# Patient Record
Sex: Female | Born: 1955 | Race: Black or African American | Hispanic: No | Marital: Single | Smoking: Never smoker
Health system: Southern US, Community
[De-identification: ages and names within clinical notes are randomized; demographics above are authoritative.]

## PROBLEM LIST (undated history)

## (undated) DIAGNOSIS — F0390 Unspecified dementia without behavioral disturbance: Secondary | ICD-10-CM

## (undated) DIAGNOSIS — R569 Unspecified convulsions: Secondary | ICD-10-CM

## (undated) DIAGNOSIS — I10 Essential (primary) hypertension: Secondary | ICD-10-CM

## (undated) DIAGNOSIS — F209 Schizophrenia, unspecified: Secondary | ICD-10-CM

## (undated) DIAGNOSIS — F319 Bipolar disorder, unspecified: Secondary | ICD-10-CM

## (undated) DIAGNOSIS — E119 Type 2 diabetes mellitus without complications: Secondary | ICD-10-CM

## (undated) HISTORY — DX: Unspecified dementia, unspecified severity, without behavioral disturbance, psychotic disturbance, mood disturbance, and anxiety: F03.90

## (undated) HISTORY — DX: Essential (primary) hypertension: I10

---

## 2003-03-28 ENCOUNTER — Encounter: Payer: Self-pay | Admitting: Internal Medicine

## 2003-03-28 ENCOUNTER — Ambulatory Visit (HOSPITAL_COMMUNITY): Admission: RE | Admit: 2003-03-28 | Discharge: 2003-03-28 | Payer: Self-pay | Admitting: Internal Medicine

## 2005-02-28 ENCOUNTER — Ambulatory Visit (HOSPITAL_COMMUNITY): Admission: RE | Admit: 2005-02-28 | Discharge: 2005-02-28 | Payer: Self-pay | Admitting: Internal Medicine

## 2009-02-03 ENCOUNTER — Encounter: Payer: Self-pay | Admitting: Gastroenterology

## 2009-02-16 HISTORY — PX: COLONOSCOPY: SHX174

## 2009-03-07 ENCOUNTER — Ambulatory Visit (HOSPITAL_COMMUNITY): Admission: RE | Admit: 2009-03-07 | Discharge: 2009-03-07 | Payer: Self-pay | Admitting: Gastroenterology

## 2009-03-07 ENCOUNTER — Ambulatory Visit: Payer: Self-pay | Admitting: Gastroenterology

## 2010-08-21 ENCOUNTER — Ambulatory Visit (HOSPITAL_COMMUNITY)
Admission: RE | Admit: 2010-08-21 | Discharge: 2010-08-21 | Payer: Self-pay | Source: Home / Self Care | Attending: Internal Medicine | Admitting: Internal Medicine

## 2010-09-09 ENCOUNTER — Encounter: Payer: Self-pay | Admitting: Internal Medicine

## 2010-09-10 ENCOUNTER — Encounter: Payer: Self-pay | Admitting: Internal Medicine

## 2011-01-01 NOTE — Op Note (Signed)
NAME:  Stephanie Vincent, Stephanie Vincent            ACCOUNT NO.:  [id number]   MEDICAL RECORD NO.:  [id number]          PATIENT TYPE:  AMB   LOCATION:  DAY                           FACILITY:  [facility]   PHYSICIAN:  Kassie Mends, M.D.      DATE OF BIRTH:  07-25-1956   DATE OF PROCEDURE:  03/07/2009  DATE OF DISCHARGE:                               OPERATIVE REPORT   REFERRING PHYSICIAN:  Tesfaye D. Felecia Shelling, MD   PROCEDURE:  Colonoscopy.   INDICATION FOR EXAM:  Stephanie Vincent is a 55 year old female who presents  for average-risk colon cancer screening.   FINDINGS:  1. Slightly tortuous colon.  Otherwise, normal colon without evidence      of polyps, masses, inflammatory changes, diverticula, or AVMs.      Excellent prep.  2. Normal retroflexed view of the rectum.   RECOMMENDATIONS:  1. Screening colonoscopy in 10 years.  2. She should follow a high-fiber diet.  She is given a handout on      high-fiber diet.   MEDICATIONS:  1. Demerol 100 mg IV.  2. Versed 5 mg IV.  3. Phenergan 6.25 mg IV.   PROCEDURE TECHNIQUE:  Physical exam was performed.  Informed consent was  obtained from the patient after explaining the benefits, risks, and  alternatives to the procedure.  The patient was connected to the monitor  and placed in the left lateral position.  Continuous oxygen was provided  by nasal cannula and IV medicine administered through an indwelling  cannula.  After administration of sedation and rectal exam, the  patient's rectum was entered,  and the scope was advanced under direct visualization to the cecum.  The  scope was removed slowly by carefully examining the color, texture,  anatomy, and integrity of the mucosa on the way out.  The patient was  recovered in endoscopy and discharged home in satisfactory condition.      Kassie Mends, M.D.  Electronically Signed     SM/MEDQ  D:  03/07/2009  T:  03/07/2009  Job:  [id number]   cc:   Tesfaye D. Felecia Shelling, MD  Fax: [phone]

## 2011-12-09 DIAGNOSIS — F332 Major depressive disorder, recurrent severe without psychotic features: Secondary | ICD-10-CM | POA: Diagnosis not present

## 2012-02-04 DIAGNOSIS — Z0000 Encounter for general adult medical examination without abnormal findings: Secondary | ICD-10-CM | POA: Diagnosis not present

## 2012-05-29 DIAGNOSIS — E785 Hyperlipidemia, unspecified: Secondary | ICD-10-CM | POA: Diagnosis not present

## 2012-05-29 DIAGNOSIS — Z0000 Encounter for general adult medical examination without abnormal findings: Secondary | ICD-10-CM | POA: Diagnosis not present

## 2012-05-29 DIAGNOSIS — I10 Essential (primary) hypertension: Secondary | ICD-10-CM | POA: Diagnosis not present

## 2012-06-03 DIAGNOSIS — G3184 Mild cognitive impairment, so stated: Secondary | ICD-10-CM | POA: Diagnosis not present

## 2012-06-03 DIAGNOSIS — IMO0002 Reserved for concepts with insufficient information to code with codable children: Secondary | ICD-10-CM | POA: Diagnosis not present

## 2012-06-05 DIAGNOSIS — F3289 Other specified depressive episodes: Secondary | ICD-10-CM | POA: Diagnosis not present

## 2012-06-05 DIAGNOSIS — F209 Schizophrenia, unspecified: Secondary | ICD-10-CM | POA: Diagnosis not present

## 2012-06-05 DIAGNOSIS — F329 Major depressive disorder, single episode, unspecified: Secondary | ICD-10-CM | POA: Diagnosis not present

## 2012-07-02 DIAGNOSIS — F339 Major depressive disorder, recurrent, unspecified: Secondary | ICD-10-CM | POA: Diagnosis not present

## 2012-08-20 DIAGNOSIS — J069 Acute upper respiratory infection, unspecified: Secondary | ICD-10-CM | POA: Diagnosis not present

## 2012-08-20 DIAGNOSIS — J410 Simple chronic bronchitis: Secondary | ICD-10-CM | POA: Diagnosis not present

## 2012-09-29 DIAGNOSIS — F331 Major depressive disorder, recurrent, moderate: Secondary | ICD-10-CM | POA: Diagnosis not present

## 2012-09-29 DIAGNOSIS — G3184 Mild cognitive impairment, so stated: Secondary | ICD-10-CM | POA: Diagnosis not present

## 2012-12-31 DIAGNOSIS — F339 Major depressive disorder, recurrent, unspecified: Secondary | ICD-10-CM | POA: Diagnosis not present

## 2013-07-27 ENCOUNTER — Other Ambulatory Visit (HOSPITAL_COMMUNITY): Payer: Self-pay | Admitting: Internal Medicine

## 2013-07-27 DIAGNOSIS — F339 Major depressive disorder, recurrent, unspecified: Secondary | ICD-10-CM | POA: Diagnosis not present

## 2013-07-27 DIAGNOSIS — F2089 Other schizophrenia: Secondary | ICD-10-CM | POA: Diagnosis not present

## 2013-07-27 DIAGNOSIS — Z139 Encounter for screening, unspecified: Secondary | ICD-10-CM

## 2013-08-03 ENCOUNTER — Ambulatory Visit (HOSPITAL_COMMUNITY)
Admission: RE | Admit: 2013-08-03 | Discharge: 2013-08-03 | Disposition: A | Discharge status: Home / Self Care | Payer: Medicare Other | Source: Ambulatory Visit | Attending: Internal Medicine | Admitting: Internal Medicine

## 2013-08-03 DIAGNOSIS — Z1231 Encounter for screening mammogram for malignant neoplasm of breast: Secondary | ICD-10-CM | POA: Diagnosis not present

## 2013-08-03 DIAGNOSIS — Z139 Encounter for screening, unspecified: Secondary | ICD-10-CM

## 2013-08-05 DIAGNOSIS — Z23 Encounter for immunization: Secondary | ICD-10-CM | POA: Diagnosis not present

## 2013-12-27 DIAGNOSIS — F339 Major depressive disorder, recurrent, unspecified: Secondary | ICD-10-CM | POA: Diagnosis not present

## 2014-02-17 DIAGNOSIS — G3184 Mild cognitive impairment, so stated: Secondary | ICD-10-CM | POA: Diagnosis not present

## 2014-02-17 DIAGNOSIS — IMO0002 Reserved for concepts with insufficient information to code with codable children: Secondary | ICD-10-CM | POA: Diagnosis not present

## 2014-06-27 DIAGNOSIS — Z23 Encounter for immunization: Secondary | ICD-10-CM | POA: Diagnosis not present

## 2014-06-27 DIAGNOSIS — F329 Major depressive disorder, single episode, unspecified: Secondary | ICD-10-CM | POA: Diagnosis not present

## 2014-06-27 DIAGNOSIS — F209 Schizophrenia, unspecified: Secondary | ICD-10-CM | POA: Diagnosis not present

## 2014-09-02 DIAGNOSIS — F3341 Major depressive disorder, recurrent, in partial remission: Secondary | ICD-10-CM | POA: Diagnosis not present

## 2014-09-02 DIAGNOSIS — F200 Paranoid schizophrenia: Secondary | ICD-10-CM | POA: Diagnosis not present

## 2014-09-02 DIAGNOSIS — G3184 Mild cognitive impairment, so stated: Secondary | ICD-10-CM | POA: Diagnosis not present

## 2014-12-26 DIAGNOSIS — F329 Major depressive disorder, single episode, unspecified: Secondary | ICD-10-CM | POA: Diagnosis not present

## 2014-12-26 DIAGNOSIS — I10 Essential (primary) hypertension: Secondary | ICD-10-CM | POA: Diagnosis not present

## 2015-02-28 ENCOUNTER — Other Ambulatory Visit (HOSPITAL_COMMUNITY): Payer: Self-pay | Admitting: Internal Medicine

## 2015-02-28 DIAGNOSIS — Z1231 Encounter for screening mammogram for malignant neoplasm of breast: Secondary | ICD-10-CM

## 2015-03-10 ENCOUNTER — Ambulatory Visit (HOSPITAL_COMMUNITY)
Admission: RE | Admit: 2015-03-10 | Discharge: 2015-03-10 | Disposition: A | Discharge status: Home / Self Care | Payer: Medicare Other | Source: Ambulatory Visit | Attending: Internal Medicine | Admitting: Internal Medicine

## 2015-03-10 DIAGNOSIS — Z1231 Encounter for screening mammogram for malignant neoplasm of breast: Secondary | ICD-10-CM | POA: Diagnosis not present

## 2015-04-03 DIAGNOSIS — F329 Major depressive disorder, single episode, unspecified: Secondary | ICD-10-CM | POA: Diagnosis not present

## 2015-04-03 DIAGNOSIS — F209 Schizophrenia, unspecified: Secondary | ICD-10-CM | POA: Diagnosis not present

## 2015-04-13 DIAGNOSIS — G40804 Other epilepsy, intractable, without status epilepticus: Secondary | ICD-10-CM | POA: Diagnosis not present

## 2015-04-13 DIAGNOSIS — F71 Moderate intellectual disabilities: Secondary | ICD-10-CM | POA: Diagnosis not present

## 2015-04-13 DIAGNOSIS — I10 Essential (primary) hypertension: Secondary | ICD-10-CM | POA: Diagnosis not present

## 2015-04-13 DIAGNOSIS — M419 Scoliosis, unspecified: Secondary | ICD-10-CM | POA: Diagnosis not present

## 2015-04-17 DIAGNOSIS — F3341 Major depressive disorder, recurrent, in partial remission: Secondary | ICD-10-CM | POA: Diagnosis not present

## 2015-04-17 DIAGNOSIS — F200 Paranoid schizophrenia: Secondary | ICD-10-CM | POA: Diagnosis not present

## 2015-04-17 DIAGNOSIS — G3184 Mild cognitive impairment, so stated: Secondary | ICD-10-CM | POA: Diagnosis not present

## 2015-05-12 DIAGNOSIS — R569 Unspecified convulsions: Secondary | ICD-10-CM | POA: Diagnosis not present

## 2015-07-03 DIAGNOSIS — F339 Major depressive disorder, recurrent, unspecified: Secondary | ICD-10-CM | POA: Diagnosis not present

## 2015-07-03 DIAGNOSIS — Z23 Encounter for immunization: Secondary | ICD-10-CM | POA: Diagnosis not present

## 2015-07-03 DIAGNOSIS — F209 Schizophrenia, unspecified: Secondary | ICD-10-CM | POA: Diagnosis not present

## 2015-07-10 DIAGNOSIS — G40804 Other epilepsy, intractable, without status epilepticus: Secondary | ICD-10-CM | POA: Diagnosis not present

## 2015-07-10 DIAGNOSIS — F71 Moderate intellectual disabilities: Secondary | ICD-10-CM | POA: Diagnosis not present

## 2015-07-10 DIAGNOSIS — G40909 Epilepsy, unspecified, not intractable, without status epilepticus: Secondary | ICD-10-CM | POA: Diagnosis not present

## 2015-07-10 DIAGNOSIS — F200 Paranoid schizophrenia: Secondary | ICD-10-CM | POA: Diagnosis not present

## 2015-09-13 DIAGNOSIS — Z79899 Other long term (current) drug therapy: Secondary | ICD-10-CM | POA: Diagnosis not present

## 2015-09-13 DIAGNOSIS — G40804 Other epilepsy, intractable, without status epilepticus: Secondary | ICD-10-CM | POA: Diagnosis not present

## 2015-09-13 DIAGNOSIS — F71 Moderate intellectual disabilities: Secondary | ICD-10-CM | POA: Diagnosis not present

## 2015-09-13 DIAGNOSIS — F200 Paranoid schizophrenia: Secondary | ICD-10-CM | POA: Diagnosis not present

## 2015-11-21 DIAGNOSIS — E538 Deficiency of other specified B group vitamins: Secondary | ICD-10-CM | POA: Diagnosis not present

## 2015-11-21 DIAGNOSIS — M818 Other osteoporosis without current pathological fracture: Secondary | ICD-10-CM | POA: Diagnosis not present

## 2015-11-21 DIAGNOSIS — R5383 Other fatigue: Secondary | ICD-10-CM | POA: Diagnosis not present

## 2015-11-21 DIAGNOSIS — Z79899 Other long term (current) drug therapy: Secondary | ICD-10-CM | POA: Diagnosis not present

## 2015-11-21 DIAGNOSIS — E559 Vitamin D deficiency, unspecified: Secondary | ICD-10-CM | POA: Diagnosis not present

## 2015-11-22 DIAGNOSIS — G3184 Mild cognitive impairment, so stated: Secondary | ICD-10-CM | POA: Diagnosis not present

## 2015-11-22 DIAGNOSIS — F200 Paranoid schizophrenia: Secondary | ICD-10-CM | POA: Diagnosis not present

## 2015-12-07 DIAGNOSIS — F71 Moderate intellectual disabilities: Secondary | ICD-10-CM | POA: Diagnosis not present

## 2015-12-07 DIAGNOSIS — F200 Paranoid schizophrenia: Secondary | ICD-10-CM | POA: Diagnosis not present

## 2015-12-07 DIAGNOSIS — G40804 Other epilepsy, intractable, without status epilepticus: Secondary | ICD-10-CM | POA: Diagnosis not present

## 2015-12-07 DIAGNOSIS — Z79899 Other long term (current) drug therapy: Secondary | ICD-10-CM | POA: Diagnosis not present

## 2015-12-25 DIAGNOSIS — I10 Essential (primary) hypertension: Secondary | ICD-10-CM | POA: Diagnosis not present

## 2015-12-25 DIAGNOSIS — F329 Major depressive disorder, single episode, unspecified: Secondary | ICD-10-CM | POA: Diagnosis not present

## 2015-12-25 DIAGNOSIS — E785 Hyperlipidemia, unspecified: Secondary | ICD-10-CM | POA: Diagnosis not present

## 2015-12-25 DIAGNOSIS — F209 Schizophrenia, unspecified: Secondary | ICD-10-CM | POA: Diagnosis not present

## 2015-12-25 DIAGNOSIS — F259 Schizoaffective disorder, unspecified: Secondary | ICD-10-CM | POA: Diagnosis not present

## 2015-12-27 ENCOUNTER — Encounter: Payer: Self-pay | Admitting: Obstetrics and Gynecology

## 2016-01-04 ENCOUNTER — Other Ambulatory Visit: Payer: Self-pay | Admitting: Obstetrics and Gynecology

## 2016-01-04 ENCOUNTER — Other Ambulatory Visit (HOSPITAL_COMMUNITY)
Admission: RE | Admit: 2016-01-04 | Discharge: 2016-01-04 | Disposition: A | Discharge status: Home / Self Care | Payer: Medicare Other | Source: Ambulatory Visit | Attending: Obstetrics and Gynecology | Admitting: Obstetrics and Gynecology

## 2016-01-04 ENCOUNTER — Ambulatory Visit (INDEPENDENT_AMBULATORY_CARE_PROVIDER_SITE_OTHER): Payer: Medicare Other | Admitting: Obstetrics and Gynecology

## 2016-01-04 ENCOUNTER — Encounter: Payer: Self-pay | Admitting: Obstetrics and Gynecology

## 2016-01-04 VITALS — BP 120/80 | Ht 63.0 in | Wt 124.0 lb

## 2016-01-04 DIAGNOSIS — Z124 Encounter for screening for malignant neoplasm of cervix: Secondary | ICD-10-CM

## 2016-01-04 DIAGNOSIS — Z01419 Encounter for gynecological examination (general) (routine) without abnormal findings: Secondary | ICD-10-CM | POA: Diagnosis not present

## 2016-01-04 DIAGNOSIS — Z1151 Encounter for screening for human papillomavirus (HPV): Secondary | ICD-10-CM | POA: Diagnosis not present

## 2016-01-04 DIAGNOSIS — N841 Polyp of cervix uteri: Secondary | ICD-10-CM | POA: Diagnosis not present

## 2016-01-04 NOTE — Progress Notes (Signed)
   [facility]  @DATE @            Patient name: Stephanie PoppFrances L Vincent MRN [id number]  Date of birth: July 23, 1956  CC & HPI:  Stephanie PoppFrances L Vincent is a 60 y.o. female presenting today for a pap smear. Her caretaker (LPN) denies any complaints at this time.   ROS:  Review of Systems  Unable to perform ROS: dementia   Pertinent History Reviewed:   Reviewed: Significant for n/a Medical        History reviewed. No pertinent past medical history.                            Surgical Hx:   History reviewed. No pertinent past surgical history. Medications: Reviewed & Updated - see associated section                      No current outpatient prescriptions on file.   Social History: Reviewed -  reports that she has never smoked. She has never used smokeless tobacco.  Objective Findings:  Vitals: Blood pressure 120/80, height 5\' 3"  (1.6 m), weight 124 lb (56.246 kg).  Physical Examination: General appearance - alert, well appearing, and in no distress and normal appearing weight Pelvic -  VULVA: normal appearing vulva with no masses, tenderness or lesions,  VAGINA: normal appearing vagina with normal color and discharge, no lesions; tight, transverse vaginal band just below cervix,   CERVIX: normal appearing cervix without discharge or lesions, 4 mm cervical polyp that was excised by myself UTERUS: uterus is tiny in size, shape, consistency and nontender,  ADNEXA: normal adnexa in size, nontender and no masses   Assessment & Plan:   A:  1. Pap smear collected. 2. Cervical polyp measuring about 4 mm, excised by myself, and sent for biopsy.  P:  1. Follow up prn.    By signing my name below, I, Ronney LionSuzanne Le, attest that this documentation has been prepared under the direction and in the presence of Tilda BurrowJohn Evan Osburn V, MD. Electronically Signed: Ronney LionSuzanne Le, ED Scribe. 01/04/2016. 3:08 PM.  I personally performed the services described in this documentation, which was SCRIBED in  my presence. The recorded information has been reviewed and considered accurate. It has been edited as necessary during review. Tilda BurrowFERGUSON,Juno Alers V, MD

## 2016-01-08 LAB — CYTOLOGY - PAP

## 2016-04-09 DIAGNOSIS — G40909 Epilepsy, unspecified, not intractable, without status epilepticus: Secondary | ICD-10-CM | POA: Diagnosis not present

## 2016-04-09 DIAGNOSIS — F71 Moderate intellectual disabilities: Secondary | ICD-10-CM | POA: Diagnosis not present

## 2016-04-09 DIAGNOSIS — F200 Paranoid schizophrenia: Secondary | ICD-10-CM | POA: Diagnosis not present

## 2016-04-09 DIAGNOSIS — G40804 Other epilepsy, intractable, without status epilepticus: Secondary | ICD-10-CM | POA: Diagnosis not present

## 2016-04-09 DIAGNOSIS — Z79899 Other long term (current) drug therapy: Secondary | ICD-10-CM | POA: Diagnosis not present

## 2016-04-09 DIAGNOSIS — F0390 Unspecified dementia without behavioral disturbance: Secondary | ICD-10-CM | POA: Diagnosis not present

## 2016-04-29 DIAGNOSIS — F209 Schizophrenia, unspecified: Secondary | ICD-10-CM | POA: Diagnosis not present

## 2016-04-29 DIAGNOSIS — F259 Schizoaffective disorder, unspecified: Secondary | ICD-10-CM | POA: Diagnosis not present

## 2016-04-29 DIAGNOSIS — E785 Hyperlipidemia, unspecified: Secondary | ICD-10-CM | POA: Diagnosis not present

## 2016-04-29 DIAGNOSIS — Z23 Encounter for immunization: Secondary | ICD-10-CM | POA: Diagnosis not present

## 2016-04-29 DIAGNOSIS — Z0000 Encounter for general adult medical examination without abnormal findings: Secondary | ICD-10-CM | POA: Diagnosis not present

## 2016-04-29 DIAGNOSIS — I10 Essential (primary) hypertension: Secondary | ICD-10-CM | POA: Diagnosis not present

## 2016-04-29 DIAGNOSIS — F3341 Major depressive disorder, recurrent, in partial remission: Secondary | ICD-10-CM | POA: Diagnosis not present

## 2016-05-30 DIAGNOSIS — F200 Paranoid schizophrenia: Secondary | ICD-10-CM | POA: Diagnosis not present

## 2016-05-30 DIAGNOSIS — G3184 Mild cognitive impairment, so stated: Secondary | ICD-10-CM | POA: Diagnosis not present

## 2016-10-07 DIAGNOSIS — G40804 Other epilepsy, intractable, without status epilepticus: Secondary | ICD-10-CM | POA: Diagnosis not present

## 2016-10-07 DIAGNOSIS — F200 Paranoid schizophrenia: Secondary | ICD-10-CM | POA: Diagnosis not present

## 2016-10-07 DIAGNOSIS — Z79899 Other long term (current) drug therapy: Secondary | ICD-10-CM | POA: Diagnosis not present

## 2016-10-07 DIAGNOSIS — F71 Moderate intellectual disabilities: Secondary | ICD-10-CM | POA: Diagnosis not present

## 2016-11-26 DIAGNOSIS — F981 Encopresis not due to a substance or known physiological condition: Secondary | ICD-10-CM | POA: Diagnosis not present

## 2016-11-26 DIAGNOSIS — G3184 Mild cognitive impairment, so stated: Secondary | ICD-10-CM | POA: Diagnosis not present

## 2016-11-26 DIAGNOSIS — F200 Paranoid schizophrenia: Secondary | ICD-10-CM | POA: Diagnosis not present

## 2016-12-16 DIAGNOSIS — I10 Essential (primary) hypertension: Secondary | ICD-10-CM | POA: Diagnosis not present

## 2016-12-16 DIAGNOSIS — G40909 Epilepsy, unspecified, not intractable, without status epilepticus: Secondary | ICD-10-CM | POA: Diagnosis not present

## 2016-12-16 DIAGNOSIS — F209 Schizophrenia, unspecified: Secondary | ICD-10-CM | POA: Diagnosis not present

## 2017-06-30 DIAGNOSIS — F200 Paranoid schizophrenia: Secondary | ICD-10-CM | POA: Diagnosis not present

## 2017-06-30 DIAGNOSIS — G3184 Mild cognitive impairment, so stated: Secondary | ICD-10-CM | POA: Diagnosis not present

## 2017-07-08 ENCOUNTER — Other Ambulatory Visit (HOSPITAL_COMMUNITY): Payer: Self-pay | Admitting: Internal Medicine

## 2017-07-08 DIAGNOSIS — Z23 Encounter for immunization: Secondary | ICD-10-CM | POA: Diagnosis not present

## 2017-07-08 DIAGNOSIS — F209 Schizophrenia, unspecified: Secondary | ICD-10-CM | POA: Diagnosis not present

## 2017-07-08 DIAGNOSIS — Z1389 Encounter for screening for other disorder: Secondary | ICD-10-CM | POA: Diagnosis not present

## 2017-07-08 DIAGNOSIS — E785 Hyperlipidemia, unspecified: Secondary | ICD-10-CM | POA: Diagnosis not present

## 2017-07-08 DIAGNOSIS — Z79899 Other long term (current) drug therapy: Secondary | ICD-10-CM | POA: Diagnosis not present

## 2017-07-08 DIAGNOSIS — Z0000 Encounter for general adult medical examination without abnormal findings: Secondary | ICD-10-CM | POA: Diagnosis not present

## 2017-07-08 DIAGNOSIS — Z1231 Encounter for screening mammogram for malignant neoplasm of breast: Secondary | ICD-10-CM

## 2017-07-08 DIAGNOSIS — G40909 Epilepsy, unspecified, not intractable, without status epilepticus: Secondary | ICD-10-CM | POA: Diagnosis not present

## 2017-07-08 DIAGNOSIS — Z131 Encounter for screening for diabetes mellitus: Secondary | ICD-10-CM | POA: Diagnosis not present

## 2017-07-08 DIAGNOSIS — F259 Schizoaffective disorder, unspecified: Secondary | ICD-10-CM | POA: Diagnosis not present

## 2017-07-08 DIAGNOSIS — I10 Essential (primary) hypertension: Secondary | ICD-10-CM | POA: Diagnosis not present

## 2017-07-28 ENCOUNTER — Ambulatory Visit (HOSPITAL_COMMUNITY): Payer: Self-pay

## 2017-09-11 ENCOUNTER — Ambulatory Visit (HOSPITAL_COMMUNITY): Payer: Self-pay

## 2017-09-17 ENCOUNTER — Inpatient Hospital Stay (HOSPITAL_COMMUNITY): Admission: RE | Admit: 2017-09-17 | Payer: Self-pay | Source: Ambulatory Visit

## 2017-10-20 DIAGNOSIS — I10 Essential (primary) hypertension: Secondary | ICD-10-CM | POA: Diagnosis not present

## 2017-10-20 DIAGNOSIS — F209 Schizophrenia, unspecified: Secondary | ICD-10-CM | POA: Diagnosis not present

## 2017-10-20 DIAGNOSIS — G40909 Epilepsy, unspecified, not intractable, without status epilepticus: Secondary | ICD-10-CM | POA: Diagnosis not present

## 2018-01-26 DIAGNOSIS — I10 Essential (primary) hypertension: Secondary | ICD-10-CM | POA: Diagnosis not present

## 2018-01-26 DIAGNOSIS — F0391 Unspecified dementia with behavioral disturbance: Secondary | ICD-10-CM | POA: Diagnosis not present

## 2018-01-26 DIAGNOSIS — G40919 Epilepsy, unspecified, intractable, without status epilepticus: Secondary | ICD-10-CM | POA: Diagnosis not present

## 2018-01-26 DIAGNOSIS — Z79899 Other long term (current) drug therapy: Secondary | ICD-10-CM | POA: Diagnosis not present

## 2018-01-27 DIAGNOSIS — F209 Schizophrenia, unspecified: Secondary | ICD-10-CM | POA: Diagnosis not present

## 2018-01-27 DIAGNOSIS — I10 Essential (primary) hypertension: Secondary | ICD-10-CM | POA: Diagnosis not present

## 2018-01-27 DIAGNOSIS — G40909 Epilepsy, unspecified, not intractable, without status epilepticus: Secondary | ICD-10-CM | POA: Diagnosis not present

## 2018-05-19 DIAGNOSIS — E46 Unspecified protein-calorie malnutrition: Secondary | ICD-10-CM | POA: Diagnosis not present

## 2018-05-19 DIAGNOSIS — Z23 Encounter for immunization: Secondary | ICD-10-CM | POA: Diagnosis not present

## 2018-05-19 DIAGNOSIS — F209 Schizophrenia, unspecified: Secondary | ICD-10-CM | POA: Diagnosis not present

## 2018-05-19 DIAGNOSIS — I10 Essential (primary) hypertension: Secondary | ICD-10-CM | POA: Diagnosis not present

## 2018-05-19 DIAGNOSIS — G40909 Epilepsy, unspecified, not intractable, without status epilepticus: Secondary | ICD-10-CM | POA: Diagnosis not present

## 2018-06-02 DIAGNOSIS — F981 Encopresis not due to a substance or known physiological condition: Secondary | ICD-10-CM | POA: Diagnosis not present

## 2018-06-02 DIAGNOSIS — F200 Paranoid schizophrenia: Secondary | ICD-10-CM | POA: Diagnosis not present

## 2018-06-02 DIAGNOSIS — G3184 Mild cognitive impairment, so stated: Secondary | ICD-10-CM | POA: Diagnosis not present

## 2018-06-09 DIAGNOSIS — E785 Hyperlipidemia, unspecified: Secondary | ICD-10-CM | POA: Diagnosis not present

## 2018-06-09 DIAGNOSIS — G40909 Epilepsy, unspecified, not intractable, without status epilepticus: Secondary | ICD-10-CM | POA: Diagnosis not present

## 2018-06-09 DIAGNOSIS — E46 Unspecified protein-calorie malnutrition: Secondary | ICD-10-CM | POA: Diagnosis not present

## 2018-06-09 DIAGNOSIS — F259 Schizoaffective disorder, unspecified: Secondary | ICD-10-CM | POA: Diagnosis not present

## 2018-06-09 DIAGNOSIS — I10 Essential (primary) hypertension: Secondary | ICD-10-CM | POA: Diagnosis not present

## 2018-06-10 ENCOUNTER — Encounter: Payer: Self-pay | Admitting: Gastroenterology

## 2018-07-09 ENCOUNTER — Ambulatory Visit: Payer: Medicare Other | Admitting: Gastroenterology

## 2018-07-21 DIAGNOSIS — F339 Major depressive disorder, recurrent, unspecified: Secondary | ICD-10-CM | POA: Diagnosis not present

## 2018-07-21 DIAGNOSIS — G40909 Epilepsy, unspecified, not intractable, without status epilepticus: Secondary | ICD-10-CM | POA: Diagnosis not present

## 2018-07-21 DIAGNOSIS — Z1389 Encounter for screening for other disorder: Secondary | ICD-10-CM | POA: Diagnosis not present

## 2018-07-21 DIAGNOSIS — I10 Essential (primary) hypertension: Secondary | ICD-10-CM | POA: Diagnosis not present

## 2018-07-21 DIAGNOSIS — Z1331 Encounter for screening for depression: Secondary | ICD-10-CM | POA: Diagnosis not present

## 2018-07-21 DIAGNOSIS — F0390 Unspecified dementia without behavioral disturbance: Secondary | ICD-10-CM | POA: Diagnosis not present

## 2018-07-23 ENCOUNTER — Other Ambulatory Visit (HOSPITAL_COMMUNITY): Payer: Self-pay | Admitting: Internal Medicine

## 2018-07-23 DIAGNOSIS — Z1231 Encounter for screening mammogram for malignant neoplasm of breast: Secondary | ICD-10-CM

## 2018-07-28 DIAGNOSIS — F0391 Unspecified dementia with behavioral disturbance: Secondary | ICD-10-CM | POA: Diagnosis not present

## 2018-07-28 DIAGNOSIS — I10 Essential (primary) hypertension: Secondary | ICD-10-CM | POA: Diagnosis not present

## 2018-07-28 DIAGNOSIS — G40919 Epilepsy, unspecified, intractable, without status epilepticus: Secondary | ICD-10-CM | POA: Diagnosis not present

## 2018-07-28 DIAGNOSIS — Z79899 Other long term (current) drug therapy: Secondary | ICD-10-CM | POA: Diagnosis not present

## 2018-08-31 DIAGNOSIS — F981 Encopresis not due to a substance or known physiological condition: Secondary | ICD-10-CM | POA: Diagnosis not present

## 2018-08-31 DIAGNOSIS — F200 Paranoid schizophrenia: Secondary | ICD-10-CM | POA: Diagnosis not present

## 2018-09-17 ENCOUNTER — Emergency Department (HOSPITAL_COMMUNITY)
Admission: EM | Admit: 2018-09-17 | Discharge: 2018-09-17 | Disposition: A | Discharge status: Home / Self Care | Payer: Medicare Other | Attending: Emergency Medicine | Admitting: Emergency Medicine

## 2018-09-17 ENCOUNTER — Other Ambulatory Visit: Payer: Self-pay

## 2018-09-17 ENCOUNTER — Emergency Department (HOSPITAL_COMMUNITY): Payer: Medicare Other

## 2018-09-17 ENCOUNTER — Ambulatory Visit: Payer: Medicare Other | Admitting: Gastroenterology

## 2018-09-17 DIAGNOSIS — M546 Pain in thoracic spine: Secondary | ICD-10-CM | POA: Diagnosis not present

## 2018-09-17 DIAGNOSIS — S8991XA Unspecified injury of right lower leg, initial encounter: Secondary | ICD-10-CM | POA: Diagnosis not present

## 2018-09-17 DIAGNOSIS — M542 Cervicalgia: Secondary | ICD-10-CM | POA: Diagnosis not present

## 2018-09-17 DIAGNOSIS — S299XXA Unspecified injury of thorax, initial encounter: Secondary | ICD-10-CM | POA: Diagnosis not present

## 2018-09-17 DIAGNOSIS — M25562 Pain in left knee: Secondary | ICD-10-CM | POA: Insufficient documentation

## 2018-09-17 DIAGNOSIS — S8992XA Unspecified injury of left lower leg, initial encounter: Secondary | ICD-10-CM | POA: Diagnosis not present

## 2018-09-17 DIAGNOSIS — M25561 Pain in right knee: Secondary | ICD-10-CM

## 2018-09-17 MED ORDER — IBUPROFEN 400 MG PO TABS
600.0000 mg | ORAL_TABLET | Freq: Once | ORAL | Status: AC
  Administered 2018-09-17: 400 mg via ORAL
  Filled 2018-09-17: qty 2

## 2018-09-17 NOTE — ED Notes (Signed)
Patient transported to X-ray 

## 2018-09-17 NOTE — ED Provider Notes (Signed)
[facility] Emergency Department Provider Note MRN:  [id number]  Arrival date & time: 09/17/18     Chief Complaint   Back pain History of Present Illness   Stephanie Vincent is a 63 y.o. year-old female with a history of schizophrenia and cognitive delay presenting to the ED with chief complaint of back pain.  Patient has been walking hunched over and with a limp for the past 2 days.  Denies trauma.  Denies fever, no chest pain or shortness of breath, no abdominal pain.  Caregiver explains that patient has been grabbing onto things to keep her balance and then slumped to the floor.  Has not fallen, has not sustained significant trauma.  Endorsing bilateral knee pain, thoracic back pain.  Denies neck pain, no low back pain, no hip pain.  Pain is constant, worse with motion.  I was unable to obtain an accurate HPI, PMH, or ROS due to the patient's cognitive delay.  Review of Systems  A complete 10 system review of systems was obtained and all systems are negative except as noted in the HPI and PMH.   Patient's Health History   Past medical history: Schizophrenia, cognitive delay   No family history on file.  Social history: Lives at care facility Social History   Socioeconomic History  . Marital status: Divorced    Spouse name: Not on file  . Number of children: Not on file  . Years of education: Not on file  . Highest education level: Not on file  Occupational History  . Not on file  Social Needs  . Financial resource strain: Not on file  . Food insecurity:    Worry: Not on file    Inability: Not on file  . Transportation needs:    Medical: Not on file    Non-medical: Not on file  Tobacco Use  . Smoking status: Not on file  Substance and Sexual Activity  . Alcohol use: Not on file  . Drug use: Not on file  . Sexual activity: Not on file  Lifestyle  . Physical activity:    Days per week: Not on file    Minutes per session: Not on file  . Stress: Not  on file  Relationships  . Social connections:    Talks on phone: Not on file    Gets together: Not on file    Attends religious service: Not on file    Active member of club or organization: Not on file    Attends meetings of clubs or organizations: Not on file    Relationship status: Not on file  . Intimate partner violence:    Fear of current or ex partner: Not on file    Emotionally abused: Not on file    Physically abused: Not on file    Forced sexual activity: Not on file  Other Topics Concern  . Not on file  Social History Narrative  . Not on file     Physical Exam  Vital Signs and Nursing Notes reviewed Vitals:   09/17/18 1745  BP: 137/85  Pulse: 87  Resp: 12  Temp: 97.6 F (36.4 C)  SpO2: 100%    CONSTITUTIONAL: Well-appearing, NAD NEURO:  Alert and oriented x 3, no focal deficits EYES:  eyes equal and reactive ENT/NECK:  no LAD, no JVD CARDIO: Regular rate, well-perfused, normal S1 and S2 PULM:  CTAB no wheezing or rhonchi GI/GU:  normal bowel sounds, non-distended, non-tender MSK/SPINE:  No gross deformities, 1+ pitting  edema bilateral lower extremities; ambulates hunched over with a limp favoring the left leg SKIN:  no rash, atraumatic PSYCH:  Appropriate speech and behavior  Diagnostic and Interventional Summary    Labs Reviewed - No data to display  DG Thoracic Spine 2 View  Final Result    DG Knee Complete 4 Views Left  Final Result    DG Knee Complete 4 Views Right  Final Result      Medications  ibuprofen (ADVIL,MOTRIN) tablet 600 mg (400 mg Oral Given 09/17/18 2029)     Procedures Critical Care  ED Course and Medical Decision Making  I have reviewed the triage vital signs and the nursing notes.  Pertinent labs & imaging results that were available during my care of the patient were reviewed by me and considered in my medical decision making (see below for details).  Unclear reason for patient's new ambulation issues, no focal  neurological deficits, mild edema to lower extremities but with no tenderness, no erythema, swelling is symmetric, little to no concern for DVT, nothing to suggest infection.  Normal range of motion of the hips, knees, ankles, nothing to suggest septic joint.  Given patient's limitations in communication, will obtain screening x-rays.  X-rays consistent with arthritic changes.  Patient is without bowel or bladder dysfunction, no neurological deficits, nothing to suggest myelopathy with his back pain today.  Advised PCP follow-up, compression stockings for the leg swelling.  After the discussed management above, the patient was determined to be safe for discharge.  The patient was in agreement with this plan and all questions regarding their care were answered.  ED return precautions were discussed and the patient will return to the ED with any significant worsening of condition.  Elmer Sow. Pilar Plate, MD [facility] [email or web address]  Final Clinical Impressions(s) / ED Diagnoses     ICD-10-CM   1. Acute pain of both knees M25.561    M25.562   2. Acute midline thoracic back pain M54.6     ED Discharge Orders    None         Sabas Sous, MD 09/17/18 2144

## 2018-09-17 NOTE — Discharge Instructions (Addendum)
You were evaluated in the Emergency Department and after careful evaluation, we did not find any emergent condition requiring admission or further testing in the hospital.  Your symptoms today seem to be due to arthritis.  We recommend Tylenol or ibuprofen during the day for aches and pains.  For your leg swelling, we recommend compression stockings and elevating the legs during the day as much as possible.  Both of these issues should be followed up by your primary care provider.  Please return to the Emergency Department if you experience any worsening of your condition.  We encourage you to follow up with a primary care provider.  Thank you for allowing [place] to be a part of your care.

## 2018-09-17 NOTE — ED Notes (Signed)
Pt ambulatory to waiting room. Pt verbalized understanding of discharge instructions.   

## 2018-09-17 NOTE — ED Notes (Signed)
Pt wheeled out by family. Pt verbalized understanding of D/C instructions.

## 2018-09-17 NOTE — ED Triage Notes (Signed)
From [facility] in [place], pt fell today, complaining of back and knee pain.

## 2018-09-21 ENCOUNTER — Ambulatory Visit: Payer: Medicare Other | Admitting: Nurse Practitioner

## 2018-09-23 ENCOUNTER — Other Ambulatory Visit: Payer: Self-pay

## 2018-09-23 ENCOUNTER — Encounter (HOSPITAL_COMMUNITY): Payer: Self-pay

## 2018-09-23 ENCOUNTER — Emergency Department (HOSPITAL_COMMUNITY): Payer: Medicare Other

## 2018-09-23 ENCOUNTER — Emergency Department (HOSPITAL_COMMUNITY)
Admission: EM | Admit: 2018-09-23 | Discharge: 2018-09-24 | Disposition: A | Discharge status: Home / Self Care | Payer: Medicare Other | Attending: Emergency Medicine | Admitting: Emergency Medicine

## 2018-09-23 DIAGNOSIS — M545 Low back pain: Secondary | ICD-10-CM | POA: Diagnosis not present

## 2018-09-23 DIAGNOSIS — R600 Localized edema: Secondary | ICD-10-CM | POA: Diagnosis not present

## 2018-09-23 DIAGNOSIS — F331 Major depressive disorder, recurrent, moderate: Secondary | ICD-10-CM | POA: Diagnosis not present

## 2018-09-23 DIAGNOSIS — R000 Tachycardia, unspecified: Secondary | ICD-10-CM | POA: Diagnosis not present

## 2018-09-23 DIAGNOSIS — R609 Edema, unspecified: Secondary | ICD-10-CM

## 2018-09-23 DIAGNOSIS — F32A Depression, unspecified: Secondary | ICD-10-CM | POA: Diagnosis present

## 2018-09-23 DIAGNOSIS — F329 Major depressive disorder, single episode, unspecified: Secondary | ICD-10-CM | POA: Diagnosis present

## 2018-09-23 DIAGNOSIS — Z79899 Other long term (current) drug therapy: Secondary | ICD-10-CM | POA: Insufficient documentation

## 2018-09-23 DIAGNOSIS — F79 Unspecified intellectual disabilities: Secondary | ICD-10-CM | POA: Diagnosis not present

## 2018-09-23 DIAGNOSIS — F209 Schizophrenia, unspecified: Secondary | ICD-10-CM | POA: Diagnosis not present

## 2018-09-23 DIAGNOSIS — R4182 Altered mental status, unspecified: Secondary | ICD-10-CM | POA: Diagnosis not present

## 2018-09-23 HISTORY — DX: Schizophrenia, unspecified: F20.9

## 2018-09-23 HISTORY — DX: Bipolar disorder, unspecified: F31.9

## 2018-09-23 HISTORY — DX: Unspecified convulsions: R56.9

## 2018-09-23 LAB — HEPATIC FUNCTION PANEL
ALBUMIN: 3.7 g/dL (ref 3.5–5.0)
ALT: 52 U/L — ABNORMAL HIGH (ref 0–44)
AST: 80 U/L — ABNORMAL HIGH (ref 15–41)
Alkaline Phosphatase: 36 U/L — ABNORMAL LOW (ref 38–126)
Bilirubin, Direct: 0.1 mg/dL (ref 0.0–0.2)
Indirect Bilirubin: 0.4 mg/dL (ref 0.3–0.9)
Total Bilirubin: 0.5 mg/dL (ref 0.3–1.2)
Total Protein: 7.1 g/dL (ref 6.5–8.1)

## 2018-09-23 LAB — CBC
HCT: 34.9 % — ABNORMAL LOW (ref 36.0–46.0)
Hemoglobin: 11.1 g/dL — ABNORMAL LOW (ref 12.0–15.0)
MCH: 31.4 pg (ref 26.0–34.0)
MCHC: 31.8 g/dL (ref 30.0–36.0)
MCV: 98.6 fL (ref 80.0–100.0)
PLATELETS: 253 10*3/uL (ref 150–400)
RBC: 3.54 MIL/uL — ABNORMAL LOW (ref 3.87–5.11)
RDW: 12.4 % (ref 11.5–15.5)
WBC: 6.9 10*3/uL (ref 4.0–10.5)
nRBC: 0 % (ref 0.0–0.2)

## 2018-09-23 LAB — ACETAMINOPHEN LEVEL

## 2018-09-23 LAB — RAPID URINE DRUG SCREEN, HOSP PERFORMED
Amphetamines: NOT DETECTED
Barbiturates: POSITIVE — AB
Benzodiazepines: NOT DETECTED
COCAINE: NOT DETECTED
OPIATES: NOT DETECTED
Tetrahydrocannabinol: NOT DETECTED

## 2018-09-23 LAB — BASIC METABOLIC PANEL
Anion gap: 8 (ref 5–15)
BUN: 13 mg/dL (ref 6–20)
CALCIUM: 9.3 mg/dL (ref 8.9–10.3)
CHLORIDE: 106 mmol/L (ref 98–111)
CO2: 26 mmol/L (ref 22–32)
Creatinine, Ser: 0.45 mg/dL (ref 0.44–1.00)
GFR calc Af Amer: >60 mL/min (ref >60)
GFR calc non Af Amer: >60 mL/min (ref >60)
Glucose, Bld: 124 mg/dL — ABNORMAL HIGH (ref 70–99)
Potassium: 3.4 mmol/L — ABNORMAL LOW (ref 3.5–5.1)
SODIUM: 140 mmol/L (ref 135–145)

## 2018-09-23 LAB — I-STAT BETA HCG BLOOD, ED (MC, WL, AP ONLY): I-stat hCG, quantitative: <5 m[IU]/mL (ref <5)

## 2018-09-23 LAB — I-STAT TROPONIN, ED: Troponin i, poc: 0.01 ng/mL (ref 0.00–0.08)

## 2018-09-23 LAB — SALICYLATE LEVEL

## 2018-09-23 LAB — ETHANOL: Alcohol, Ethyl (B): <10 mg/dL (ref <10)

## 2018-09-23 LAB — BRAIN NATRIURETIC PEPTIDE: B Natriuretic Peptide: 13.2 pg/mL (ref 0.0–100.0)

## 2018-09-23 MED ORDER — OLANZAPINE 2.5 MG PO TABS
2.5000 mg | ORAL_TABLET | Freq: Every day | ORAL | Status: DC
  Administered 2018-09-24: 2.5 mg via ORAL
  Filled 2018-09-23 (×2): qty 1

## 2018-09-23 MED ORDER — LISINOPRIL 10 MG PO TABS
10.0000 mg | ORAL_TABLET | Freq: Every day | ORAL | Status: DC
  Administered 2018-09-23 – 2018-09-24 (×2): 10 mg via ORAL
  Filled 2018-09-23 (×2): qty 1

## 2018-09-23 MED ORDER — CLONAZEPAM 1 MG PO TABS: 1.0000 mg | ORAL_TABLET | Freq: Two times a day (BID) | ORAL | Status: DC | PRN

## 2018-09-23 MED ORDER — SODIUM CHLORIDE 0.9% FLUSH: 3.0000 mL | Freq: Once | INTRAVENOUS | Status: DC

## 2018-09-23 MED ORDER — DONEPEZIL HCL 5 MG PO TABS
10.0000 mg | ORAL_TABLET | Freq: Every day | ORAL | Status: DC
  Administered 2018-09-23 – 2018-09-24 (×2): 10 mg via ORAL
  Filled 2018-09-23 (×2): qty 2

## 2018-09-23 MED ORDER — DIVALPROEX SODIUM 250 MG PO DR TAB
250.0000 mg | DELAYED_RELEASE_TABLET | Freq: Two times a day (BID) | ORAL | Status: DC
  Administered 2018-09-23 – 2018-09-24 (×2): 250 mg via ORAL
  Filled 2018-09-23 (×2): qty 1

## 2018-09-23 MED ORDER — PHENOBARBITAL 32.4 MG PO TABS
97.2000 mg | ORAL_TABLET | Freq: Every day | ORAL | Status: DC
  Administered 2018-09-23: 97.2 mg via ORAL
  Filled 2018-09-23: qty 3

## 2018-09-23 MED ORDER — MEMANTINE HCL 10 MG PO TABS
10.0000 mg | ORAL_TABLET | Freq: Two times a day (BID) | ORAL | Status: DC
  Administered 2018-09-23 – 2018-09-24 (×2): 10 mg via ORAL
  Filled 2018-09-23 (×2): qty 1

## 2018-09-23 MED ORDER — FLUVOXAMINE MALEATE 50 MG PO TABS
300.0000 mg | ORAL_TABLET | Freq: Every day | ORAL | Status: DC
  Administered 2018-09-23: 300 mg via ORAL
  Filled 2018-09-23: qty 6

## 2018-09-23 MED ORDER — OLANZAPINE 5 MG PO TABS
5.0000 mg | ORAL_TABLET | Freq: Every day | ORAL | Status: DC
  Administered 2018-09-23: 5 mg via ORAL
  Filled 2018-09-23: qty 1

## 2018-09-23 NOTE — Progress Notes (Signed)
Caregiver Melissa would like to be informed of any change in patient status.

## 2018-09-23 NOTE — ED Notes (Signed)
Bed: WA28 Expected date:  Expected time:  Means of arrival:  Comments: Triage 3 

## 2018-09-23 NOTE — ED Notes (Signed)
Patient stuck x2 for lab draw, unsuccessful x2

## 2018-09-23 NOTE — ED Provider Notes (Addendum)
[facility]-EMERGENCY DEPT Provider Note   CSN: [id number] Arrival date & time: 09/23/18  1227     History   Chief Complaint Chief Complaint  Patient presents with  . Leg Swelling  . Depression    HPI Stephanie Vincent is a 63 y.o. female.  HPI   She presents for evaluation of abnormal behavior.  By report of the family home where she resides, she has not been taking care of herself, defecating on herself, and appears to be sad.  The patient is conversant and admits to sadness over the death of her parents and her son.  She states she would like to talk to a therapist.  She also complains of ongoing low back pain, without known trauma.  She denies weakness or dizziness.  There are no other known modifying factors.  Past Medical History:  Diagnosis Date  . Manic depression (HCC)   . Schizophrenia (HCC)   . Seizures (HCC)     There are no active problems to display for this patient.   History reviewed. No pertinent surgical history.   OB History   No obstetric history on file.      Home Medications    Prior to Admission medications   Medication Sig Start Date End Date Taking? Authorizing Provider  acetaminophen (TYLENOL) 500 MG tablet Take 500 mg by mouth daily as needed for mild pain.   Yes [provider]  clonazePAM (KLONOPIN) 1 MG tablet Take 1 mg by mouth 2 (two) times daily as needed (agitation).  12/08/15  Yes [provider]  divalproex (DEPAKOTE) 250 MG DR tablet Take 250 mg by mouth 2 (two) times daily.  12/15/15  Yes [provider]  donepezil (ARICEPT) 10 MG tablet Take 10 mg by mouth daily.  12/15/15  Yes [provider]  fluvoxaMINE (LUVOX) 100 MG tablet Take 300 mg by mouth at bedtime.  12/15/15  Yes [provider]  lisinopril (PRINIVIL,ZESTRIL) 10 MG tablet  12/15/15  Yes [provider]  memantine (NAMENDA) 10 MG tablet Take 10 mg by mouth 2 (two) times daily.  12/15/15  Yes  [provider]  OLANZapine (ZYPREXA) 2.5 MG tablet Take 2.5 mg by mouth daily.   Yes [provider]  OLANZapine (ZYPREXA) 5 MG tablet Take 5 mg by mouth at bedtime.   Yes [provider]  PHENobarbital (LUMINAL) 97.2 MG tablet Take 97.2 mg by mouth at bedtime.  12/09/15  Yes [provider]    Family History No family history on file.  Social History Social History   Tobacco Use  . Smoking status: Never Smoker  . Smokeless tobacco: Never Used  Substance Use Topics  . Alcohol use: No  . Drug use: No     Allergies   Patient has no known allergies.   Review of Systems Review of Systems  All other systems reviewed and are negative.    Physical Exam Updated Vital Signs BP 109/70 (BP Location: Left Arm)   Pulse 90   Temp 97.9 F (36.6 C) (Axillary)   Resp 18   Wt 56 kg   SpO2 100%   BMI 21.87 kg/m   Physical Exam Vitals signs and nursing note reviewed.  Constitutional:      General: She is not in acute distress.    Appearance: She is well-developed. She is not ill-appearing, toxic-appearing or diaphoretic.     Comments: She appears under nourished.  HENT:     Head: Normocephalic and  atraumatic.     Right Ear: External ear normal.     Left Ear: External ear normal.     Nose: No congestion or rhinorrhea.     Mouth/Throat:     Pharynx: No oropharyngeal exudate or posterior oropharyngeal erythema.  Eyes:     Conjunctiva/sclera: Conjunctivae normal.     Pupils: Pupils are equal, round, and reactive to light.  Neck:     Musculoskeletal: Normal range of motion and neck supple.     Trachea: Phonation normal.  Cardiovascular:     Rate and Rhythm: Regular rhythm. Tachycardia present.  Pulmonary:     Effort: Pulmonary effort is normal.     Breath sounds: Normal breath sounds. No stridor. No rhonchi.  Chest:     Chest wall: No tenderness.  Abdominal:     General: There is no distension.     Palpations: Abdomen is soft.      Tenderness: There is no abdominal tenderness. There is no guarding.  Musculoskeletal: Normal range of motion.        General: No swelling, tenderness or signs of injury.     Right lower leg: Edema (1-2+) present.     Left lower leg: Edema (1-2+) present.  Skin:    General: Skin is warm and dry.     Coloration: Skin is not jaundiced or pale.  Neurological:     Mental Status: She is alert and oriented to person, place, and time.     Motor: No abnormal muscle tone.  Psychiatric:        Attention and Perception: She is attentive. She does not perceive auditory or visual hallucinations.        Mood and Affect: Mood is depressed. Mood is not anxious or elated. Affect is flat. Affect is not labile, blunt, angry or tearful.        Speech: She is communicative. Speech is not delayed or tangential.        Behavior: Behavior is cooperative.        Thought Content: Thought content is not paranoid or delusional. Thought content does not include homicidal or suicidal ideation.        Cognition and Memory: Cognition is impaired. Memory is impaired.        Judgment: Judgment is not impulsive or inappropriate.      ED Treatments / Results  Labs (all labs ordered are listed, but only abnormal results are displayed) Labs Reviewed  BASIC METABOLIC PANEL - Abnormal; Notable for the following components:      Result Value   Potassium 3.4 (*)    Glucose, Bld 124 (*)    All other components within normal limits  CBC - Abnormal; Notable for the following components:   RBC 3.54 (*)    Hemoglobin 11.1 (*)    HCT 34.9 (*)    All other components within normal limits  ACETAMINOPHEN LEVEL - Abnormal; Notable for the following components:   Acetaminophen (Tylenol), Serum <10 (*)    All other components within normal limits  RAPID URINE DRUG SCREEN, HOSP PERFORMED - Abnormal; Notable for the following components:   Barbiturates POSITIVE (*)    All other components within normal limits  HEPATIC FUNCTION  PANEL - Abnormal; Notable for the following components:   AST 80 (*)    ALT 52 (*)    Alkaline Phosphatase 36 (*)    All other components within normal limits  ETHANOL  SALICYLATE LEVEL  BRAIN NATRIURETIC PEPTIDE  I-STAT TROPONIN, ED  I-STAT BETA HCG BLOOD, ED ([facility], [facility], [facility] ONLY)    EKG EKG Interpretation  Date/Time:  Wednesday September 23 2018 12:40:00 EST Ventricular Rate:  113 PR Interval:    QRS Duration: 73 QT Interval:  304 QTC Calculation: 417 R Axis:   63 Text Interpretation:  Sinus tachycardia Borderline T wave abnormalities No old tracing to compare Confirmed by Mancel BaleWentz, Labria Wos [phone]) on 09/23/2018 4:09:04 PM   Radiology Dg Chest 2 View  Result Date: 09/23/2018 CLINICAL DATA:  Arthritis.  Altered mental status. EXAM: CHEST - 2 VIEW COMPARISON:  None. FINDINGS: The heart size and mediastinal contours are within normal limits. Both lungs are clear. The visualized skeletal structures are unremarkable. IMPRESSION: No active cardiopulmonary disease. Electronically Signed   By: Elsie StainJohn T Curnes M.D.   On: 09/23/2018 13:05    Procedures Procedures (including critical care time)  Medications Ordered in ED Medications  sodium chloride flush (NS) 0.9 % injection 3 mL (has no administration in time range)     Initial Impression / Assessment and Plan / ED Course  I have reviewed the triage vital signs and the nursing notes.  Pertinent labs & imaging results that were available during my care of the patient were reviewed by me and considered in my medical decision making (see chart for details).  Clinical Course as of Sep 23 2033  Wed Sep 23, 2018  1724 Normal  I-Stat beta hCG blood, ED [EW]  1724 Normal except mild transaminase elevation  Hepatic function panel(!) [EW]  1724 Normal except hemoglobin low  CBC(!) [EW]  1725 Normal   I-stat troponin, ED [EW]  1725 Acetaminophen level(!) [EW]  1729 Normal except potassium low, glucose high  Basic metabolic panel(!) [EW]    1729 Normal except barbiturates present  Rapid urine drug screen (hospital performed)(!) [EW]  1729 At this time the patient medically cleared for treatment by psychiatry, inpatient versus outpatient as needed.   [EW]    Clinical Course User Index [EW] Mancel BaleWentz, Chantalle Defilippo, MD     Patient Vitals for the past 24 hrs:  BP Temp Temp src Pulse Resp SpO2 Weight  09/23/18 1654 109/70 97.9 F (36.6 C) Axillary 90 18 100 % -  09/23/18 1244 125/70 97.7 F (36.5 C) Oral (!) 107 18 100 % -  09/23/18 1236 - - - - - - 56 kg   4:45 PM-TTS consultation    Medical Decision Making: Patient with schizophrenia with mild behavioral abnormalities.  No overt unstable psychiatric condition.  Patient likely needs increased support in her current setting for improvement in her behavior.  Medical clearance evaluation indicates no acute medical disorders.  Patient with bilateral leg edema which is nonspecific, and does not appear to be related to a cardiac or blood disorder.  It is most likely related to inactivity.  Compression stockings ordered to assist with mobilization of peripheral fluid. Consultation with psychiatric services to help arrange disposition planning. CRITICAL CARE-no Performed by: Mancel BaleElliott Tamyka Bezio  Nursing Notes Reviewed/ Care Coordinated Applicable Imaging Reviewed Interpretation of Laboratory Data incorporated into ED treatment   Plan-as per TTS in conjunction with oncoming provider team  Final Clinical Impressions(s) / ED Diagnoses   Final diagnoses:  Schizophrenia, unspecified type [facility](HCC)  Peripheral edema    ED Discharge Orders    None       Mancel BaleWentz, Ellaina Schuler, MD 09/23/18 2033    Mancel BaleWentz, Cristian Davitt, MD 09/23/18 2035

## 2018-09-23 NOTE — ED Notes (Signed)
Bed: [facility] Expected date:  Expected time:  Means of arrival:  Comments: 
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
## 2018-09-23 NOTE — ED Notes (Signed)
Caregiver states that she "doesn't know of the pt having any allergy to peanuts or peanut butter"

## 2018-09-23 NOTE — ED Notes (Signed)
Knee-high SCDs placed to assist with BLE edema.

## 2018-09-23 NOTE — ED Triage Notes (Addendum)
Pt was seen last week for back and knee pain and dx with arthritis. Last night, pt was on her feet, and now has edema. Pt is at [facility], and family wants her "reevaluated". Per family, pt has been defecating on herself, as well as urinating on herself. Family is concerned that this is mental health as opposed to a physical issue. Pt was seen 1 month ago by psychiatrist. Pt has also been picking her nose to make it bleed, which is new.

## 2018-09-24 DIAGNOSIS — F32A Depression, unspecified: Secondary | ICD-10-CM | POA: Diagnosis present

## 2018-09-24 DIAGNOSIS — F331 Major depressive disorder, recurrent, moderate: Secondary | ICD-10-CM | POA: Diagnosis not present

## 2018-09-24 DIAGNOSIS — F329 Major depressive disorder, single episode, unspecified: Secondary | ICD-10-CM

## 2018-09-24 DIAGNOSIS — F79 Unspecified intellectual disabilities: Secondary | ICD-10-CM

## 2018-09-24 NOTE — Progress Notes (Signed)
CSW spoke with, Hetty Blend [phone], with [facility] Regarding disposition plans. CSW informed Ms. Sheffield that patient has been medically and psychiatrically cleared at this time. Ms. Spero Curb voiced understanding and stated representative would be at hospital around 3pm to pick patient up.   Patient has outpatient psychiatrist, Dr. Lance Coon with [facility], patient to follow up with provider once discharged. Instructions placed on AVS.   Stacy Gardner, LCSW Clinical Social Worker  [facility]  [phone]

## 2018-09-24 NOTE — BH Assessment (Signed)
[facility] Assessment Progress Note  Per Nelly Rout, MD, this pt does not require psychiatric hospitalization at this time.  Pt is to be discharged from [facility] to return to her residential facility.  Stacy Gardner, CSW, agrees to facilitate this.  Pt reportedly currently has an outpatient psychiatry provider, but Denny Peon will contact this writer if additional behavioral health services are needed for pt's discharge instructions.  Pt's nurse has been notified.  Doylene Canning, MA Triage Specialist [phone]

## 2018-09-24 NOTE — Consult Note (Signed)
[facility]  09/24/2018 11:11 AM Stephanie PoppFrances L Vincent  MRN:  [id number] Principal Problem: Depression Discharge Diagnoses: Principal Problem:   Depression   Subjective: Pt was seen and chart reviewed with treatment team and Dr Lucianne MussKumar. [facility] group [facility] and depression. Pt is oriented and aware of why she is here. Pt speaks in a low tone with garbled speech. Pt appears to have intellectual developmental delay.  Pt denies suicidal/homicidal ideation, denies auditory/visual hallucinations and does not appear to be responding to internal stimuli. Pt is psychiatrically clear and will return to her group home. A group home representative will arrive around 1500 to pick Pt up.   Total Time spent with patient: 30 minutes  Past Psychiatric History: As above  Past Medical History:  Past Medical History:  Diagnosis Date  . Manic depression (HCC)   . Schizophrenia (HCC)   . Seizures (HCC)    History reviewed. No pertinent surgical history. Family History: No family history on file. Family Psychiatric  History: Pt unable to provide this information Social History:  Social History   Substance and Sexual Activity  Alcohol Use No     Social History   Substance and Sexual Activity  Drug Use No    Social History   Socioeconomic History  . Marital status: Single    Spouse name: Not on file  . Number of children: Not on file  . Years of education: Not on file  . Highest education level: Not on file  Occupational History  . Not on file  Social Needs  . Financial resource strain: Not on file  . Food insecurity:    Worry: Not on file    Inability: Not on file  . Transportation needs:    Medical: Not on file    Non-medical: Not on file  Tobacco Use  . Smoking status: Never Smoker  . Smokeless tobacco: Never Used  Substance and Sexual Activity  . Alcohol use: No  . Drug use: No  . Sexual activity: Not Currently   Lifestyle  . Physical activity:    Days per week: Not on file    Minutes per session: Not on file  . Stress: Not on file  Relationships  . Social connections:    Talks on phone: Not on file    Gets together: Not on file    Attends religious service: Not on file    Active member of club or organization: Not on file    Attends meetings of clubs or organizations: Not on file    Relationship status: Not on file  Other Topics Concern  . Not on file  Social History Narrative  . Not on file    Has this patient used any form of tobacco in the last 30 days? (Cigarettes, Smokeless Tobacco, Cigars, and/or Pipes) Prescription not provided because: Pt does not smoke  Current Medications: Current Facility-Administered Medications  Medication Dose Route Frequency Provider Last Rate Last Dose  . clonazePAM (KLONOPIN) tablet 1 mg  1 mg Oral BID PRN Mancel BaleWentz, Elliott, MD      . divalproex (DEPAKOTE) DR tablet 250 mg  250 mg Oral BID Mancel BaleWentz, Elliott, MD   250 mg at 09/24/18 1043  . donepezil (ARICEPT) tablet 10 mg  10 mg Oral Daily Mancel BaleWentz, Elliott, MD   10 mg at 09/24/18 1044  . fluvoxaMINE (LUVOX) tablet 300 mg  300 mg Oral QHS Mancel BaleWentz, Elliott,  MD   300 mg at 09/23/18 2208  . lisinopril (PRINIVIL,ZESTRIL) tablet 10 mg  10 mg Oral Daily Mancel Bale, MD   10 mg at 09/24/18 1044  . memantine (NAMENDA) tablet 10 mg  10 mg Oral BID Mancel Bale, MD   10 mg at 09/24/18 1044  . OLANZapine (ZYPREXA) tablet 2.5 mg  2.5 mg Oral Daily Mancel Bale, MD   2.5 mg at 09/24/18 1044  . OLANZapine (ZYPREXA) tablet 5 mg  5 mg Oral QHS Mancel Bale, MD   5 mg at 09/23/18 2208  . PHENobarbital (LUMINAL) tablet 97.2 mg  97.2 mg Oral QHS Mancel Bale, MD   97.2 mg at 09/23/18 2242  . sodium chloride flush (NS) 0.9 % injection 3 mL  3 mL Intravenous Once Mancel Bale, MD       Current Outpatient Medications  Medication Sig Dispense Refill  . acetaminophen (TYLENOL) 500 MG tablet Take 500 mg by mouth daily as  needed for mild pain.    . clonazePAM (KLONOPIN) 1 MG tablet Take 1 mg by mouth 2 (two) times daily as needed (agitation).     Marland Kitchen divalproex (DEPAKOTE) 250 MG DR tablet Take 250 mg by mouth 2 (two) times daily.     Marland Kitchen donepezil (ARICEPT) 10 MG tablet Take 10 mg by mouth daily.     . fluvoxaMINE (LUVOX) 100 MG tablet Take 300 mg by mouth at bedtime.     Marland Kitchen lisinopril (PRINIVIL,ZESTRIL) 10 MG tablet     . memantine (NAMENDA) 10 MG tablet Take 10 mg by mouth 2 (two) times daily.     Marland Kitchen OLANZapine (ZYPREXA) 2.5 MG tablet Take 2.5 mg by mouth daily.    Marland Kitchen OLANZapine (ZYPREXA) 5 MG tablet Take 5 mg by mouth at bedtime.    Marland Kitchen PHENobarbital (LUMINAL) 97.2 MG tablet Take 97.2 mg by mouth at bedtime.      PTA Medications: (Not in a hospital admission)   Musculoskeletal: Strength & Muscle Tone: within normal limits Gait & Station: normal Patient leans: N/A  Psychiatric Specialty Exam: Physical Exam  Constitutional: She appears well-developed and well-nourished.  HENT:  Head: Normocephalic.  Respiratory: Effort normal.  Neurological: She is alert.  Psychiatric: She is slowed. Cognition and memory are impaired. She exhibits a depressed mood.    Review of Systems  Psychiatric/Behavioral: Positive for depression. Negative for memory loss.  All other systems reviewed and are negative.   Blood pressure 97/60, pulse 97, temperature 98.3 F (36.8 C), temperature source Axillary, resp. rate 16, weight 56 kg, SpO2 97 %.Body mass index is 21.87 kg/m.  General Appearance: Casual  Eye Contact:  Good  Speech:  Garbled  Volume:  Decreased  Mood:  Depressed  Affect:  Congruent and Depressed  Thought Process:  Disorganized  Orientation:  Full (Time, Place, and Person)  Thought Content:  Illogical  Suicidal Thoughts:  No  Homicidal Thoughts:  No  Memory:  Immediate;   Good Recent;   Good Remote;   Good  Judgement:  Impaired  Insight:  Shallow  Psychomotor Activity:  Decreased  Concentration:   Concentration: Fair and Attention Span: Fair  Recall:  [employer] of Knowledge:  Fair  Language:  Fair  Akathisia:  No  Handed:  Right  AIMS (if indicated):     Assets:  Architect Housing Social Support  ADL's:  Intact  Cognition:  Impaired,  Mild  Sleep:        Demographic Factors:  Low socioeconomic  status and Unemployed  Loss Factors: Decline in physical health  Historical Factors: Family history of mental illness or substance abuse  Risk Reduction Factors:   Sense of responsibility to family and Living with another person, especially a relative  Continued Clinical Symptoms:  Depression:   Anhedonia  Cognitive Features That Contribute To Risk:  Closed-mindedness    Suicide Risk:  Minimal: No identifiable suicidal ideation.  Patients presenting with no risk factors but with morbid ruminations; may be classified as minimal risk based on the severity of the depressive symptoms  Follow-up Information    Center, [facility]. Schedule an appointment as soon as possible for a visit.   Specialty:  Behavioral Health Why:  Please follow up with regular psychiatrist, Dr. Lance Coon.  Contact information: [address] [place] [address] [phone]           Plan Of Care/Follow-up recommendations:  Activity:  as tolerated Diet:  Heart healthy  Disposition: Depression Take all medications as prescribed by your outpatient provider. Keep all follow-up appointments as scheduled.  Do not consume alcohol or use illegal drugs while on prescription medications. Report any adverse effects from your medications to your primary care provider promptly.  In the event of recurrent symptoms or worsening symptoms, call 911, a crisis hotline, or go to the nearest emergency department for evaluation.   Laveda Abbe, NP 09/24/2018, 11:11 AM

## 2018-10-05 ENCOUNTER — Ambulatory Visit: Payer: Medicare Other | Admitting: Gastroenterology

## 2018-10-13 DIAGNOSIS — M549 Dorsalgia, unspecified: Secondary | ICD-10-CM | POA: Diagnosis not present

## 2018-10-13 DIAGNOSIS — I10 Essential (primary) hypertension: Secondary | ICD-10-CM | POA: Diagnosis not present

## 2018-10-13 DIAGNOSIS — F339 Major depressive disorder, recurrent, unspecified: Secondary | ICD-10-CM | POA: Diagnosis not present

## 2018-10-13 DIAGNOSIS — F209 Schizophrenia, unspecified: Secondary | ICD-10-CM | POA: Diagnosis not present

## 2018-10-29 ENCOUNTER — Other Ambulatory Visit: Payer: Self-pay

## 2018-10-29 ENCOUNTER — Ambulatory Visit (HOSPITAL_COMMUNITY)
Admission: RE | Admit: 2018-10-29 | Discharge: 2018-10-29 | Disposition: A | Discharge status: Home / Self Care | Payer: Medicare Other | Source: Ambulatory Visit | Attending: Internal Medicine | Admitting: Internal Medicine

## 2018-10-29 ENCOUNTER — Other Ambulatory Visit (HOSPITAL_COMMUNITY): Payer: Self-pay | Admitting: Internal Medicine

## 2018-10-29 DIAGNOSIS — M5489 Other dorsalgia: Secondary | ICD-10-CM | POA: Insufficient documentation

## 2018-10-29 DIAGNOSIS — M545 Low back pain: Secondary | ICD-10-CM | POA: Diagnosis not present

## 2018-11-23 ENCOUNTER — Ambulatory Visit (INDEPENDENT_AMBULATORY_CARE_PROVIDER_SITE_OTHER): Payer: Medicare Other | Admitting: Gastroenterology

## 2018-11-23 ENCOUNTER — Encounter: Payer: Self-pay | Admitting: Gastroenterology

## 2018-11-23 ENCOUNTER — Other Ambulatory Visit: Payer: Self-pay

## 2018-11-23 DIAGNOSIS — D649 Anemia, unspecified: Secondary | ICD-10-CM

## 2018-11-23 DIAGNOSIS — R634 Abnormal weight loss: Secondary | ICD-10-CM | POA: Diagnosis not present

## 2018-11-23 DIAGNOSIS — R748 Abnormal levels of other serum enzymes: Secondary | ICD-10-CM | POA: Insufficient documentation

## 2018-11-23 NOTE — Progress Notes (Addendum)
REVIEWED-NO ADDITIONAL RECOMMENDATIONS. OCT 2020 WEIGHT UP TO 122.7 LBS.   Referring Provider: Dr. Felecia Shelling  Primary Care Physician:  Avon Gully, MD  Primary GI:  Dr. Darrick Penna   Virtual Visit via Telephone Note Due to COVID-19, visit is conducted virtually and was requested by patient.   I connected with Stephanie Vincent on 11/23/18 at  1:30 PM EDT by telephone and verified that I am speaking with the correct person using two identifiers. Caretaker is presents with her from the [facility].    I discussed the limitations, risks, security and privacy concerns of performing an evaluation and management service by telephone and the availability of in person appointments. I also discussed with the patient that there may be a patient responsible charge related to this service. The patient expressed understanding and agreed to proceed.  Chief Complaint  Patient presents with  . Weight Loss    approx 116 lbs now-not lost any in past 3 months (added Ensure)     History of Present Illness: Stephanie Vincent is a 63 year old female who resides in a family care home, referred by Dr. Felecia Shelling due to weight loss and request for colonoscopy. Weight documented as 108 in Oct 2019. Caretaker reports most recent weight around 116. I do note that in 2017 she weighed 124. Resides in a family care home and has been there the past 23-24 years. Last colonoscopy in 2010 by Dr. Darrick Penna with  slightly torturous colon, otherwise normal.    No abdominal pain. No N/V. No dysphagia. No melena or hematochezia.   Reviewed labs from Feb 2020 in [facility] ED. Mild normocytic anemia with Hgb 11.1. AST 80 and ALT 52. Previous LFTs normal in Oct 2019 per PCP records. Mild normocytic anemia with Hgb 11.5 in Oct 2019 as well.   Past Medical History:  Diagnosis Date  . HTN (hypertension)   . Manic depression (HCC)   . Schizophrenia (HCC)   . Seizures (HCC)      Past Surgical History:  Procedure Laterality Date  .  COLONOSCOPY  02/2009   Dr. Darrick Penna: slightly torturous colon otherwise normal      Current Meds  Medication Sig  . acetaminophen (TYLENOL) 500 MG tablet Take 500 mg by mouth daily as needed for mild pain.  . clonazePAM (KLONOPIN) 1 MG tablet Take 1 mg by mouth 2 (two) times daily as needed (agitation).   Marland Kitchen divalproex (DEPAKOTE) 250 MG DR tablet Take 250 mg by mouth 2 (two) times daily.   Marland Kitchen donepezil (ARICEPT) 10 MG tablet Take 10 mg by mouth daily.   . fluvoxaMINE (LUVOX) 100 MG tablet Take 100 mg by mouth at bedtime.   Marland Kitchen lisinopril (PRINIVIL,ZESTRIL) 10 MG tablet Take 10 mg by mouth daily.   . memantine (NAMENDA) 10 MG tablet Take 10 mg by mouth 2 (two) times daily.   Marland Kitchen OLANZapine (ZYPREXA) 2.5 MG tablet Take 2.5 mg by mouth daily.  Marland Kitchen OLANZapine (ZYPREXA) 5 MG tablet Take 5 mg by mouth at bedtime.  Marland Kitchen PHENobarbital (LUMINAL) 97.2 MG tablet Take 97.2 mg by mouth at bedtime.        Observations/Objective: No distress. Unable to perform physical exam in person. Video reveals patient calm, no distress, sitting in chair. Dressed appropriately, keeps good eye contact.   Verbalized weight as 116.   Assessment and Plan: 62 year old female with documented weight loss that improved after adding Ensure, with last colonoscopy 10 years ago. She has no concerning lower or upper GI signs/symptoms.  I do note that she has mild normocytic anemia. Bump in transaminases in Feb 2020 with unclear etiology and previously normal.  Discussed pursuing colonoscopy in future when appropriate. May need EGD as well if evidence for IDA. Will repeat HFP in next few weeks as well.   Proceed with colonoscopy +/- EGD with Dr. Darrick Penna in the near future. The risks, benefits, and alternatives have been discussed in detail with the patient. They state understanding and desire to proceed.  Propofol due to polypharmacy  Follow Up Instructions: We are arranging a colonoscopy and possible upper endoscopy (if you have low  iron) in the near future with Dr. Darrick Penna.  Please complete the blood work in the next 3-4 weeks.   Further recommendations to follow!   I discussed the assessment and treatment plan with the patient. The patient was provided an opportunity to ask questions and all were answered. The patient agreed with the plan and demonstrated an understanding of the instructions.   The patient was advised to call back or seek an in-person evaluation if the symptoms worsen or if the condition fails to improve as anticipated.  I provided 11 minutes of face-to-face time during this encounter via facetime.  Gelene Mink, PhD, ANP-BC [facility]

## 2018-11-23 NOTE — Patient Instructions (Signed)
We are arranging a colonoscopy and possible upper endoscopy (if you have low iron) in the near future with Dr. Darrick Penna.  Please complete the blood work in the next 3-4 weeks.   Further recommendations to follow!  It was a pleasure to talk to you today. I strive to create trusting relationships with patients to provide genuine, compassionate, and quality care. I value your feedback. If you receive a survey regarding your visit,  I greatly appreciate you taking time to fill this out.   Gelene Mink, PhD, ANP-BC [facility]

## 2018-11-24 ENCOUNTER — Telehealth: Payer: Self-pay | Admitting: *Deleted

## 2018-11-24 ENCOUNTER — Other Ambulatory Visit: Payer: Self-pay

## 2018-11-24 ENCOUNTER — Other Ambulatory Visit: Payer: Self-pay | Admitting: *Deleted

## 2018-11-24 DIAGNOSIS — R634 Abnormal weight loss: Secondary | ICD-10-CM

## 2018-11-24 DIAGNOSIS — D649 Anemia, unspecified: Secondary | ICD-10-CM

## 2018-11-24 MED ORDER — PEG 3350-KCL-NA BICARB-NACL 420 G PO SOLR: 4000.0000 mL | Freq: Once | ORAL | 0 refills | Status: AC

## 2018-11-24 NOTE — Telephone Encounter (Signed)
Mrs. Stephanie Vincent called back and apologized. Patient is on the schedule for 7/21 at 12p. She asked I send Rx to [facility] and mail instructions to [address] [place]. I have mailed instructions/pre-op appointment.

## 2018-11-24 NOTE — Telephone Encounter (Signed)
Patient needs to be scheduled for TCS +/-EGD with propofol w/ [facility].  Called # listed in chart and was advised by a Mrs. Sheffield that nothing was going to be scheduled until all of this over and phone was d/c'd. FYI to AB

## 2018-11-24 NOTE — Progress Notes (Signed)
CC'ED TO PCP 

## 2019-01-04 ENCOUNTER — Telehealth: Payer: Self-pay | Admitting: Gastroenterology

## 2019-01-04 NOTE — Telephone Encounter (Signed)
Alicia: patient was to have labs done around early May. HFP, CBC, iron studies. Can we see if they can do this?

## 2019-01-05 NOTE — Telephone Encounter (Signed)
Labs were mailed to the pts residence. Spoke with Interior and spatial designer of nursing facility and was asked to have the lab work drawn outside of the home.

## 2019-02-23 DIAGNOSIS — F200 Paranoid schizophrenia: Secondary | ICD-10-CM | POA: Diagnosis not present

## 2019-02-23 DIAGNOSIS — G3184 Mild cognitive impairment, so stated: Secondary | ICD-10-CM | POA: Diagnosis not present

## 2019-02-23 DIAGNOSIS — F981 Encopresis not due to a substance or known physiological condition: Secondary | ICD-10-CM | POA: Diagnosis not present

## 2019-03-01 ENCOUNTER — Telehealth: Payer: Self-pay | Admitting: *Deleted

## 2019-03-01 NOTE — Telephone Encounter (Signed)
Pt's caregiver called and said that there was no way that the pt could go for her pre-admission testing tomorrow at [facility].  Gave pt Carolyn's number so she could discuss it with her.

## 2019-03-02 ENCOUNTER — Telehealth: Payer: Self-pay | Admitting: Gastroenterology

## 2019-03-02 ENCOUNTER — Telehealth: Payer: Self-pay | Admitting: *Deleted

## 2019-03-02 ENCOUNTER — Encounter (HOSPITAL_COMMUNITY): Admission: RE | Admit: 2019-03-02 | Payer: Medicare Other | Source: Ambulatory Visit

## 2019-03-02 NOTE — Telephone Encounter (Signed)
Noted  

## 2019-03-02 NOTE — Telephone Encounter (Signed)
Mrs Willette Pa from the group home where the patient is living called to say that she cancelled her covid testing today because it would cause problems with her limited staff and other residents. She is asking for the patient to be admitted to have covid testing and her procedure. I told her this may be something her PCP would have to order, but I would let the nurse know. Please advise. [phone]

## 2019-03-02 NOTE — Telephone Encounter (Signed)
Carolyn from endo called. Patient did not show up for pre-op appt tpday. They spoke with them and the director Mrs. Sheffield made it seem like she did not know anything about this. I advised her that she called yesterday here stating she could not make it to the pre-op appt. Well carolyn explained the covid-19 process testing to them since patient is at a facility and they stated they did not want to do this right now and wants to wait until this is over. FYI to AB

## 2019-03-02 NOTE — Telephone Encounter (Signed)
Mrs. Willette Pa said she did not realize the pt had appointment for today and she is short staffed because of the virus. She also said she did not feel the pt is able to do the prep at home, said she will mess up everything, she just does not have enough staff to deal with that. She wants to know if pt can be admitted and have everything done at the hospital. Sending to Roseanne Kaufman, NP who saw pt in the office.

## 2019-03-03 NOTE — Telephone Encounter (Signed)
I will talk with Dr. Oneida Alar.

## 2019-03-03 NOTE — Telephone Encounter (Addendum)
[facility] clinical pool and Doris:  We can arrange colonoscopy +/- EGD with Propofol with Dr. Oneida Alar. Needs admission day prior for bowel prep. Will need to admit through hospitalist. Please just let me know details of when this will be so I can reach out to hospitalist.

## 2019-03-04 NOTE — Telephone Encounter (Signed)
Spoke to Mrs. Sheffield, she is aware that pt will be scheduled when the October schedule is open.

## 2019-03-04 NOTE — Telephone Encounter (Signed)
We will have to arrange for procedure once we received [facility] schedule for October. FYI to AB

## 2019-03-09 ENCOUNTER — Encounter (HOSPITAL_COMMUNITY): Payer: Self-pay

## 2019-03-09 ENCOUNTER — Ambulatory Visit (HOSPITAL_COMMUNITY): Admit: 2019-03-09 | Payer: Medicare Other | Admitting: Gastroenterology

## 2019-03-09 SURGERY — COLONOSCOPY WITH PROPOFOL
Anesthesia: Monitor Anesthesia Care

## 2019-03-09 NOTE — Telephone Encounter (Signed)
Called Ms. Sheffield, TCS/-/+EGD w/Propofol w/[facility] scheduled for 06/01/19 at 10:15am. Pt to be admitted for assistance with prep 05/31/19. Ms. Sheffield is aware bed placement will call her 05/31/19 by approx 8:00am for pt to go to [facility]. Instructions mailed to facility. LMOVM to inform endo scheduler.  Vicente Males, [facility] and bed placement will need to be notified 05/28/19.

## 2019-03-22 DIAGNOSIS — F339 Major depressive disorder, recurrent, unspecified: Secondary | ICD-10-CM | POA: Diagnosis not present

## 2019-03-22 DIAGNOSIS — G40909 Epilepsy, unspecified, not intractable, without status epilepticus: Secondary | ICD-10-CM | POA: Diagnosis not present

## 2019-03-22 DIAGNOSIS — R634 Abnormal weight loss: Secondary | ICD-10-CM | POA: Diagnosis not present

## 2019-03-22 DIAGNOSIS — I10 Essential (primary) hypertension: Secondary | ICD-10-CM | POA: Diagnosis not present

## 2019-04-22 DIAGNOSIS — I10 Essential (primary) hypertension: Secondary | ICD-10-CM | POA: Diagnosis not present

## 2019-04-22 DIAGNOSIS — F339 Major depressive disorder, recurrent, unspecified: Secondary | ICD-10-CM | POA: Diagnosis not present

## 2019-05-22 DIAGNOSIS — F209 Schizophrenia, unspecified: Secondary | ICD-10-CM | POA: Diagnosis not present

## 2019-05-22 DIAGNOSIS — I10 Essential (primary) hypertension: Secondary | ICD-10-CM | POA: Diagnosis not present

## 2019-05-25 ENCOUNTER — Telehealth: Payer: Self-pay | Admitting: Gastroenterology

## 2019-05-25 NOTE — Telephone Encounter (Signed)
Bonesteel FROM THE CARE HOME CALLED WITH QUESTIONS ABOUT HER PROCEDURE [phone], MENTIONED DOING THE PREP AT [facility]

## 2019-05-25 NOTE — Telephone Encounter (Signed)
Called Stephanie Vincent. Advised her she is the one that requested be admitted for the prep. She stated that is fine and to leave as is.

## 2019-05-28 NOTE — Telephone Encounter (Signed)
I spoke with Dr. Denton Brick who is aware of need for obs placement.

## 2019-05-28 NOTE — Telephone Encounter (Signed)
Called bed placement and spoke to [facility]. Arranged for admission to [facility] 05/31/19. Asked for admission by 8:00am if possible.

## 2019-05-31 ENCOUNTER — Other Ambulatory Visit: Payer: Self-pay

## 2019-05-31 ENCOUNTER — Encounter (HOSPITAL_COMMUNITY): Payer: Self-pay

## 2019-05-31 ENCOUNTER — Inpatient Hospital Stay (HOSPITAL_COMMUNITY)
Admission: RE | Admit: 2019-05-31 | Discharge: 2019-06-02 | DRG: 392 | Disposition: A | Discharge status: Home / Self Care | Payer: Medicare Other | Attending: Family Medicine | Admitting: Family Medicine

## 2019-05-31 DIAGNOSIS — K2970 Gastritis, unspecified, without bleeding: Principal | ICD-10-CM | POA: Diagnosis present

## 2019-05-31 DIAGNOSIS — D649 Anemia, unspecified: Secondary | ICD-10-CM | POA: Diagnosis not present

## 2019-05-31 DIAGNOSIS — R569 Unspecified convulsions: Secondary | ICD-10-CM

## 2019-05-31 DIAGNOSIS — R748 Abnormal levels of other serum enzymes: Secondary | ICD-10-CM | POA: Diagnosis present

## 2019-05-31 DIAGNOSIS — Z20828 Contact with and (suspected) exposure to other viral communicable diseases: Secondary | ICD-10-CM | POA: Diagnosis present

## 2019-05-31 DIAGNOSIS — Z87898 Personal history of other specified conditions: Secondary | ICD-10-CM | POA: Diagnosis not present

## 2019-05-31 DIAGNOSIS — F32A Depression, unspecified: Secondary | ICD-10-CM | POA: Diagnosis present

## 2019-05-31 DIAGNOSIS — I10 Essential (primary) hypertension: Secondary | ICD-10-CM | POA: Diagnosis present

## 2019-05-31 DIAGNOSIS — Z79899 Other long term (current) drug therapy: Secondary | ICD-10-CM

## 2019-05-31 DIAGNOSIS — F329 Major depressive disorder, single episode, unspecified: Secondary | ICD-10-CM | POA: Diagnosis present

## 2019-05-31 DIAGNOSIS — K635 Polyp of colon: Secondary | ICD-10-CM | POA: Diagnosis present

## 2019-05-31 DIAGNOSIS — Z23 Encounter for immunization: Secondary | ICD-10-CM

## 2019-05-31 DIAGNOSIS — F209 Schizophrenia, unspecified: Secondary | ICD-10-CM | POA: Diagnosis present

## 2019-05-31 LAB — CBC
HCT: 39.4 % (ref 36.0–46.0)
Hemoglobin: 12.6 g/dL (ref 12.0–15.0)
MCH: 30.6 pg (ref 26.0–34.0)
MCHC: 32 g/dL (ref 30.0–36.0)
MCV: 95.6 fL (ref 80.0–100.0)
Platelets: 236 10*3/uL (ref 150–400)
RBC: 4.12 MIL/uL (ref 3.87–5.11)
RDW: 11.8 % (ref 11.5–15.5)
WBC: 4.1 10*3/uL (ref 4.0–10.5)
nRBC: 0 % (ref 0.0–0.2)

## 2019-05-31 LAB — HEPATIC FUNCTION PANEL
ALT: 17 U/L (ref 0–44)
AST: 21 U/L (ref 15–41)
Albumin: 4 g/dL (ref 3.5–5.0)
Alkaline Phosphatase: 47 U/L (ref 38–126)
Bilirubin, Direct: <0.1 mg/dL (ref 0.0–0.2)
Total Bilirubin: 0.3 mg/dL (ref 0.3–1.2)
Total Protein: 8 g/dL (ref 6.5–8.1)

## 2019-05-31 LAB — BASIC METABOLIC PANEL
Anion gap: 10 (ref 5–15)
BUN: 16 mg/dL (ref 6–20)
CO2: 24 mmol/L (ref 22–32)
Calcium: 9.3 mg/dL (ref 8.9–10.3)
Chloride: 99 mmol/L (ref 98–111)
Creatinine, Ser: 0.42 mg/dL — ABNORMAL LOW (ref 0.44–1.00)
GFR calc Af Amer: >60 mL/min (ref >60)
GFR calc non Af Amer: >60 mL/min (ref >60)
Glucose, Bld: 106 mg/dL — ABNORMAL HIGH (ref 70–99)
Potassium: 3.7 mmol/L (ref 3.5–5.1)
Sodium: 133 mmol/L — ABNORMAL LOW (ref 135–145)

## 2019-05-31 LAB — SARS CORONAVIRUS 2 (TAT 6-24 HRS): SARS Coronavirus 2: NEGATIVE

## 2019-05-31 MED ORDER — MEMANTINE HCL 10 MG PO TABS
10.0000 mg | ORAL_TABLET | Freq: Two times a day (BID) | ORAL | Status: DC
  Administered 2019-05-31 – 2019-06-02 (×5): 10 mg via ORAL
  Filled 2019-05-31 (×5): qty 1

## 2019-05-31 MED ORDER — ACETAMINOPHEN 650 MG RE SUPP: 650.0000 mg | Freq: Four times a day (QID) | RECTAL | Status: DC | PRN

## 2019-05-31 MED ORDER — PEG 3350-KCL-NA BICARB-NACL 420 G PO SOLR
4000.0000 mL | Freq: Once | ORAL | Status: AC
  Administered 2019-05-31: 4000 mL via ORAL

## 2019-05-31 MED ORDER — PHENOBARBITAL 97.2 MG PO TABS
97.2000 mg | ORAL_TABLET | Freq: Every day | ORAL | Status: DC
  Administered 2019-05-31 – 2019-06-01 (×2): 97.2 mg via ORAL
  Filled 2019-05-31 (×2): qty 1

## 2019-05-31 MED ORDER — HYDROCODONE-ACETAMINOPHEN 5-325 MG PO TABS
1.0000 | ORAL_TABLET | Freq: Once | ORAL | Status: AC
  Administered 2019-05-31: 1 via ORAL
  Filled 2019-05-31: qty 1

## 2019-05-31 MED ORDER — OLANZAPINE 5 MG PO TABS
10.0000 mg | ORAL_TABLET | Freq: Every day | ORAL | Status: DC
  Administered 2019-05-31 – 2019-06-02 (×3): 10 mg via ORAL
  Filled 2019-05-31 (×3): qty 2

## 2019-05-31 MED ORDER — DIVALPROEX SODIUM 250 MG PO DR TAB
250.0000 mg | DELAYED_RELEASE_TABLET | Freq: Two times a day (BID) | ORAL | Status: DC
  Administered 2019-05-31 – 2019-06-02 (×5): 250 mg via ORAL
  Filled 2019-05-31 (×5): qty 1

## 2019-05-31 MED ORDER — ACETAMINOPHEN 325 MG PO TABS
650.0000 mg | ORAL_TABLET | Freq: Four times a day (QID) | ORAL | Status: DC | PRN
  Administered 2019-06-01 (×2): 650 mg via ORAL
  Filled 2019-05-31 (×2): qty 2

## 2019-05-31 MED ORDER — PANTOPRAZOLE SODIUM 40 MG PO TBEC
40.0000 mg | DELAYED_RELEASE_TABLET | Freq: Every day | ORAL | Status: DC
  Administered 2019-05-31 – 2019-06-02 (×3): 40 mg via ORAL
  Filled 2019-05-31 (×3): qty 1

## 2019-05-31 MED ORDER — ONDANSETRON HCL 4 MG PO TABS: 4.0000 mg | ORAL_TABLET | Freq: Four times a day (QID) | ORAL | Status: DC | PRN

## 2019-05-31 MED ORDER — ACETAMINOPHEN 500 MG PO TABS
500.0000 mg | ORAL_TABLET | Freq: Every day | ORAL | Status: DC | PRN
  Administered 2019-06-01: 500 mg via ORAL
  Filled 2019-05-31: qty 1

## 2019-05-31 MED ORDER — SODIUM CHLORIDE 0.9 % IV SOLN: 250.0000 mL | INTRAVENOUS | Status: DC | PRN

## 2019-05-31 MED ORDER — LABETALOL HCL 5 MG/ML IV SOLN: 10.0000 mg | INTRAVENOUS | Status: DC | PRN

## 2019-05-31 MED ORDER — SODIUM CHLORIDE 0.9% FLUSH
3.0000 mL | Freq: Two times a day (BID) | INTRAVENOUS | Status: DC
  Administered 2019-05-31: 3 mL via INTRAVENOUS

## 2019-05-31 MED ORDER — TRAZODONE HCL 50 MG PO TABS: 50.0000 mg | ORAL_TABLET | Freq: Every evening | ORAL | Status: DC | PRN

## 2019-05-31 MED ORDER — SODIUM CHLORIDE 0.9% FLUSH: 3.0000 mL | INTRAVENOUS | Status: DC | PRN

## 2019-05-31 MED ORDER — HEPARIN SODIUM (PORCINE) 5000 UNIT/ML IJ SOLN
5000.0000 [IU] | Freq: Three times a day (TID) | INTRAMUSCULAR | Status: AC
  Administered 2019-05-31 (×2): 5000 [IU] via SUBCUTANEOUS
  Filled 2019-05-31 (×2): qty 1

## 2019-05-31 MED ORDER — ONDANSETRON HCL 4 MG/2ML IJ SOLN: 4.0000 mg | Freq: Four times a day (QID) | INTRAMUSCULAR | Status: DC | PRN

## 2019-05-31 MED ORDER — ALBUTEROL SULFATE (2.5 MG/3ML) 0.083% IN NEBU: 2.5000 mg | INHALATION_SOLUTION | RESPIRATORY_TRACT | Status: DC | PRN

## 2019-05-31 MED ORDER — POLYETHYLENE GLYCOL 3350 17 G PO PACK: 17.0000 g | PACK | Freq: Every day | ORAL | Status: DC | PRN

## 2019-05-31 MED ORDER — FLUVOXAMINE MALEATE 50 MG PO TABS
100.0000 mg | ORAL_TABLET | Freq: Every day | ORAL | Status: DC
  Administered 2019-05-31 – 2019-06-01 (×2): 100 mg via ORAL
  Filled 2019-05-31 (×5): qty 2

## 2019-05-31 MED ORDER — LISINOPRIL 10 MG PO TABS
10.0000 mg | ORAL_TABLET | Freq: Every day | ORAL | Status: DC
  Administered 2019-05-31 – 2019-06-02 (×3): 10 mg via ORAL
  Filled 2019-05-31 (×3): qty 1

## 2019-05-31 MED ORDER — DONEPEZIL HCL 5 MG PO TABS
10.0000 mg | ORAL_TABLET | Freq: Every day | ORAL | Status: DC
  Administered 2019-05-31 – 2019-06-02 (×3): 10 mg via ORAL
  Filled 2019-05-31 (×3): qty 2

## 2019-05-31 MED ORDER — DEXTROSE-NACL 5-0.45 % IV SOLN
INTRAVENOUS | Status: DC
  Administered 2019-05-31 – 2019-06-02 (×3): via INTRAVENOUS

## 2019-05-31 MED ORDER — INFLUENZA VAC SPLIT QUAD 0.5 ML IM SUSY
0.5000 mL | PREFILLED_SYRINGE | INTRAMUSCULAR | Status: AC
  Administered 2019-06-02: 0.5 mL via INTRAMUSCULAR
  Filled 2019-05-31: qty 0.5

## 2019-05-31 MED ORDER — CLONAZEPAM 0.5 MG PO TABS
1.0000 mg | ORAL_TABLET | Freq: Two times a day (BID) | ORAL | Status: DC | PRN
  Administered 2019-06-01 (×2): 1 mg via ORAL
  Filled 2019-05-31 (×2): qty 2

## 2019-05-31 NOTE — H&P (Signed)
Patient Demographics:    Stephanie Vincent, is a 63 y.o. female  MRN: [id number]   DOB - Apr 30, 1956  Admit Date - 05/31/2019  Outpatient Primary MD for the patient is Avon Gully, MD   Assessment & Plan:    Principal Problem:   History of weight loss Active Problems:   Depression   Elevated liver enzymes   Anemia   Schizophrenia (HCC)   Seizure (HCC)    1)H/o Wt Loss--history of anemia, hemoglobin now 12.6, patient is a poor historian -GI consult appreciated plan is for colonoscopy plus minus EGD on 06/01/2019   2)Schizophrenia/Depression/Dementia-----stable continue Depakote 250 mg p.o. twice daily, Aricept 10 mg daily, Luvox 100 mg nightly, Namenda 10 mg twice daily, Zyprexa 10 mg daily, phenobarbital 97.2 mg nightly, as needed clonazepam  3)HTN--stable, continue lisinopril 10 mg daily,   may use IV labetalol when necessary  Every 4 hours for systolic blood pressure over 160 mmhg  4)Elevated LFT--repeat CMP, defer to GI service  Other--- patient has significant cognitive and psychiatric challenges, lives in a group home, has been unable to follow-up with the labs and appointments, unable to complete colonoscopy prep at the group home safely -Plan is for propofol for colonoscopy due to polypharmacy and potential difficulty with sedation -Place in observation status overnight to complete colonoscopy prep and work-up for weight loss -  With History of - Reviewed by me  Past Medical History:  Diagnosis Date  . HTN (hypertension)   . Manic depression (HCC)   . Schizophrenia (HCC)   . Seizures (HCC)       Past Surgical History:  Procedure Laterality Date  . COLONOSCOPY  02/2009   Dr. Darrick Penna: slightly torturous colon otherwise normal    No chief complaint on file.     HPI:    Stephanie Vincent  is a 63 y.o. female  40 y.o. year old female  with past medical history relevant for history of seizures, schizophrenia, depressive disorder, concerns about weight loss and anemia as well as elevated LFTs who was unable to follow-up as outpatient for further work-up of weight loss and elevated LFTs --Patient resides in a group home she has cognitive challenges -Seizures on multiple neuropsychiatric medications GI service would like to do endoluminal evaluation colonoscopy plus minus EGD under propofol -Patient unable to safely complete colonoscopy prep at the group home due to cognitive challenges  -Hospitalist service requested to place patient in observation status overnight for prep  -Patient's last colonoscopy was back in 2010 by Dr. Darrick Penna without significant abnormalities except for slightly torturous colon  -Patient is a poor historian she does admit to some epigastric discomfort there are questions about weight loss, questions about anemia questions about elevated LFTs work-up is been challenging as patient has been unable to follow-up as outpatient  No nausea, vomiting, diarrhea   No fever  Or chills       Review of systems:    In  addition to the HPI above,   A full Review of  Systems was done, all other systems reviewed are negative except as noted above in HPI , .   Social History:  Reviewed by me    Social History   Tobacco Use  . Smoking status: Never Smoker  . Smokeless tobacco: Never Used  Substance Use Topics  . Alcohol use: No       Family History :  Reviewed by me    Family History  Problem Relation Age of Onset  . Colon cancer Neg Hx   . Colon polyps Neg Hx     Home Medications:   Prior to Admission medications   Medication Sig Start Date End Date Taking? Authorizing Provider  acetaminophen (TYLENOL) 500 MG tablet Take 500 mg by mouth daily as needed for mild pain.   Yes [provider]  clonazePAM (KLONOPIN) 1 MG tablet Take  1 mg by mouth 2 (two) times daily as needed (agitation).  12/08/15  Yes [provider]  divalproex (DEPAKOTE) 250 MG DR tablet Take 250 mg by mouth 2 (two) times daily.  12/15/15  Yes [provider]  donepezil (ARICEPT) 10 MG tablet Take 10 mg by mouth daily.  12/15/15  Yes [provider]  fluvoxaMINE (LUVOX) 100 MG tablet Take 100 mg by mouth at bedtime.  12/15/15  Yes [provider]  lisinopril (PRINIVIL,ZESTRIL) 10 MG tablet Take 10 mg by mouth daily.  12/15/15  Yes [provider]  memantine (NAMENDA) 10 MG tablet Take 10 mg by mouth 2 (two) times daily.  12/15/15  Yes [provider]  OLANZapine (ZYPREXA) 10 MG tablet Take 10 mg by mouth daily.   Yes [provider]  PHENobarbital (LUMINAL) 97.2 MG tablet Take 97.2 mg by mouth at bedtime.  12/09/15  Yes [provider]     Allergies:    No Known Allergies   Physical Exam:   Vitals  Blood pressure 131/86, pulse 80, temperature 98.9 F (37.2 C), temperature source Oral, resp. rate 16, height 5\' 3"  (1.6 m), weight 55.8 kg, SpO2 97 %.  Physical Examination: General appearance - alert, well appearing, and in no distress  Mental status - alert, oriented to person, --some cognitive deficits Eyes - sclera anicteric Neck - supple, no JVD elevation , Chest - clear  to auscultation bilaterally, symmetrical air movement,  Heart - S1 and S2 normal, regular  Abdomen - soft, nontender, nondistended, no masses or organomegaly Neurological - screening mental status exam normal, neck supple without rigidity, cranial nerves II through XII intact, DTR's normal and symmetric Extremities - no pedal edema noted, intact peripheral pulses  Skin - warm, dry    Data Review:    CBC Recent Labs  Lab 05/31/19 1335  WBC 4.1  HGB 12.6  HCT 39.4  PLT 236  MCV 95.6  MCH 30.6  MCHC 32.0  RDW 11.8    ------------------------------------------------------------------------------------------------------------------  Chemistries  Recent Labs  Lab 05/31/19 1335  NA 133*  K 3.7  CL 99  CO2 24  GLUCOSE 106*  BUN 16  CREATININE 0.42*  CALCIUM 9.3   ------------------------------------------------------------------------------------------------------------------ estimated creatinine clearance is 61.9 mL/min (A) (by C-G formula based on SCr of 0.42 mg/dL (L)). ------------------------------------------------------------------------------------------------------------------ No results for input(s): TSH, T4TOTAL, T3FREE, THYROIDAB in the last 72 hours.  Invalid input(s): FREET3   Coagulation profile No results for input(s): INR, PROTIME in the last 168 hours. ------------------------------------------------------------------------------------------------------------------- No results for input(s): DDIMER in the last 72  hours. -------------------------------------------------------------------------------------------------------------------  Cardiac Enzymes No results for input(s): CKMB, TROPONINI, MYOGLOBIN in the last 168 hours.  Invalid input(s): CK ------------------------------------------------------------------------------------------------------------------    Component Value Date/Time   BNP 13.2 09/23/2018 1248   ---------------------------------------------------------------------------------------------------------------  Urinalysis No results found for: COLORURINE, APPEARANCEUR, LABSPEC, PHURINE, GLUCOSEU, HGBUR, BILIRUBINUR, KETONESUR, PROTEINUR, UROBILINOGEN, NITRITE, LEUKOCYTESUR  ----------------------------------------------------------------------------------------------------------------   Imaging Results:    No results found.  Radiological Exams on Admission: No results found.  DVT Prophylaxis -SCD /Heparin subcu on hold to allow for endoluminal  evaluation AM Labs Ordered, also please review Full Orders  Family Communication: Admission, patients condition and plan of care including tests being ordered have been discussed with the patient  who indicate understanding and agree with the plan   Code Status - Full Code  Likely DC to  Home   Condition   stable  Roxan Hockey M.D on 05/31/2019 at 4:34 PM Go to [email or web address] -  for contact info  [facility] - Office  [phone]

## 2019-05-31 NOTE — Consult Note (Signed)
Primary Care Physician:  Avon Gully, MD Primary Gastroenterologist:  Dr. Darrick Penna   Date of Admission: 05/31/2019 Date of Consultation: 05/31/2019  Reason for Consultation:  Admit for observation to complete colonoscopy prep   HPI:  Stephanie Vincent is a 63 y.o. year old female who resides in a family care home, referred earlier this year as an outpatient to [facility] due to weight loss and request for colonoscopy. Last colonoscopy in 2010 by Dr. Darrick Penna with slightly torturous colon, otherwise normal. She had a virtual visit in April 2020, with plans for colonoscopy +/- EGD with Propofol. Due to inability to complete prep at home with the facility and short-staffed, patient is being admitted for observation to complete prep. Colonoscopy +/- EGD by Dr. Darrick Penna with Propofol has been planned for tomorrow.   Weight documented as 108 in Oct 2019. In 2017, she weighed 124. At time of virtual visit in April 2020, documented as weighing around 116. Her weight is 122. Previously, she had mild normocytic anemia noted in epic both on outside records and last in Feb 2020 with Hgb in the 11 range. Iron studies had been ordered, but this was never completed despite requests.   Mildly elevated transaminases with AST 80 and ALT 52 in Feb 2020, and this was to be followed up as outpatient. Patient never completed labs.   She is a limited historian. Knows she is here for endoscopic procedures; however, she does not know the year or the city. Believes she is in [place]. Reoriented. Vague upper abdominal pain intermittently per patient, but she is unable to discuss any exacerbating factors. She would like something to eat now. Has been on clear liquids since yesterday in preparation for procedures. No N/V. No dysphagia. Unclear if history of constipation. No overt GI bleeding.    Past Medical History:  Diagnosis Date  . HTN (hypertension)   . Manic depression (HCC)   . Schizophrenia (HCC)   . Seizures (HCC)      Past Surgical History:  Procedure Laterality Date  . COLONOSCOPY  02/2009   Dr. Darrick Penna: slightly torturous colon otherwise normal     Prior to Admission medications   Medication Sig Start Date End Date Taking? Authorizing Provider  acetaminophen (TYLENOL) 500 MG tablet Take 500 mg by mouth daily as needed for mild pain.   Yes [provider]  clonazePAM (KLONOPIN) 1 MG tablet Take 1 mg by mouth 2 (two) times daily as needed (agitation).  12/08/15  Yes [provider]  divalproex (DEPAKOTE) 250 MG DR tablet Take 250 mg by mouth 2 (two) times daily.  12/15/15  Yes [provider]  donepezil (ARICEPT) 10 MG tablet Take 10 mg by mouth daily.  12/15/15  Yes [provider]  fluvoxaMINE (LUVOX) 100 MG tablet Take 100 mg by mouth at bedtime.  12/15/15  Yes [provider]  lisinopril (PRINIVIL,ZESTRIL) 10 MG tablet Take 10 mg by mouth daily.  12/15/15  Yes [provider]  memantine (NAMENDA) 10 MG tablet Take 10 mg by mouth 2 (two) times daily.  12/15/15  Yes [provider]  OLANZapine (ZYPREXA) 10 MG tablet Take 10 mg by mouth daily.   Yes [provider]  PHENobarbital (LUMINAL) 97.2 MG tablet Take 97.2 mg by mouth at bedtime.  12/09/15  Yes [provider]    Current Facility-Administered Medications  Medication Dose Route Frequency Provider Last Rate Last Dose  . 0.9 %  sodium chloride infusion  250 mL Intravenous PRN  Roxan Hockey, MD      . acetaminophen (TYLENOL) tablet 650 mg  650 mg Oral Q6H PRN Emokpae, Courage, MD       Or  . acetaminophen (TYLENOL) suppository 650 mg  650 mg Rectal Q6H PRN Emokpae, Courage, MD      . acetaminophen (TYLENOL) tablet 500 mg  500 mg Oral Daily PRN Emokpae, Courage, MD      . albuterol (PROVENTIL) (2.5 MG/3ML) 0.083% nebulizer solution 2.5 mg  2.5 mg Nebulization Q2H PRN Emokpae, Courage, MD      . clonazePAM (KLONOPIN) tablet 1 mg  1 mg Oral BID PRN Emokpae, Courage, MD       . dextrose 5 %-0.45 % sodium chloride infusion   Intravenous Continuous Emokpae, Courage, MD 40 mL/hr at 05/31/19 1244    . divalproex (DEPAKOTE) DR tablet 250 mg  250 mg Oral BID Denton Brick, Courage, MD   250 mg at 05/31/19 1250  . donepezil (ARICEPT) tablet 10 mg  10 mg Oral Daily Emokpae, Courage, MD   10 mg at 05/31/19 1250  . fluvoxaMINE (LUVOX) tablet 100 mg  100 mg Oral QHS Emokpae, Courage, MD      . heparin injection 5,000 Units  5,000 Units Subcutaneous Q8H Emokpae, Courage, MD   5,000 Units at 05/31/19 1301  . lisinopril (ZESTRIL) tablet 10 mg  10 mg Oral Daily Emokpae, Courage, MD   10 mg at 05/31/19 1250  . memantine (NAMENDA) tablet 10 mg  10 mg Oral BID Roxan Hockey, MD   10 mg at 05/31/19 1250  . OLANZapine (ZYPREXA) tablet 10 mg  10 mg Oral Daily Emokpae, Courage, MD   10 mg at 05/31/19 1251  . ondansetron (ZOFRAN) tablet 4 mg  4 mg Oral Q6H PRN Emokpae, Courage, MD       Or  . ondansetron (ZOFRAN) injection 4 mg  4 mg Intravenous Q6H PRN Emokpae, Courage, MD      . PHENobarbital (LUMINAL) tablet 97.2 mg  97.2 mg Oral QHS Emokpae, Courage, MD      . polyethylene glycol (MIRALAX / GLYCOLAX) packet 17 g  17 g Oral Daily PRN Emokpae, Courage, MD      . polyethylene glycol-electrolytes (NuLYTELY/GoLYTELY) solution 4,000 mL  4,000 mL Oral Once Annitta Needs, NP      . sodium chloride flush (NS) 0.9 % injection 3 mL  3 mL Intravenous Q12H Emokpae, Courage, MD   3 mL at 05/31/19 1251  . sodium chloride flush (NS) 0.9 % injection 3 mL  3 mL Intravenous PRN Emokpae, Courage, MD      . traZODone (DESYREL) tablet 50 mg  50 mg Oral QHS PRN Roxan Hockey, MD        Allergies as of 05/28/2019  . (No Known Allergies)    Family History  Problem Relation Age of Onset  . Colon cancer Neg Hx   . Colon polyps Neg Hx     Social History   Socioeconomic History  . Marital status: Single    Spouse name: Not on file  . Number of children: Not on file  . Years of education: Not on  file  . Highest education level: Not on file  Occupational History  . Not on file  Social Needs  . Financial resource strain: Not hard at all  . Food insecurity    Worry: Never true    Inability: Never true  . Transportation needs    Medical: No    Non-medical: No  Tobacco  Use  . Smoking status: Never Smoker  . Smokeless tobacco: Never Used  Substance and Sexual Activity  . Alcohol use: No  . Drug use: No  . Sexual activity: Not Currently  Lifestyle  . Physical activity    Days per week: 0 days    Minutes per session: 0 min  . Stress: Not at all  Relationships  . Social Musicianconnections    Talks on phone: Never    Gets together: Never    Attends religious service: Never    Active member of club or organization: No    Attends meetings of clubs or organizations: Never    Relationship status: Never married  . Intimate partner violence    Fear of current or ex partner: No    Emotionally abused: No    Physically abused: No    Forced sexual activity: No  Other Topics Concern  . Not on file  Social History Narrative  . Not on file    Review of Systems: Gen: Denies fever, chills, loss of appetite, change in weight or weight loss CV: Denies chest pain, heart palpitations, syncope, edema  Resp: Denies shortness of breath with rest, cough, wheezing GI: see HPI GU : Denies urinary burning, urinary frequency, urinary incontinence.  MS: +arthritis Derm: Denies rash, itching, dry skin Psych: Denies depression, anxiety,confusion, or memory loss Heme: see HPI  Physical Exam: Vital signs in last 24 hours: Temp:  [98.9 F (37.2 C)] 98.9 F (37.2 C) (10/12 1215) Pulse Rate:  [80] 80 (10/12 1215) Resp:  [16] 16 (10/12 1215) BP: (131)/(86) 131/86 (10/12 1215) SpO2:  [97 %] 97 % (10/12 1215) Weight:  [55.8 kg] 55.8 kg (10/12 1215) Last BM Date: (unknown) General:   Alert, thin but does not appear cachectic, flat affect.  Head:  Normocephalic and atraumatic. Eyes:  Sclera clear,  no icterus.   Conjunctiva pink. Ears:  Normal auditory acuity. Lungs:  Clear throughout to auscultation.  Heart:  S1 S2 present without murmurs.  Abdomen:  Soft, nontender and nondistended. No masses, hepatosplenomegaly or hernias noted. Normal bowel sounds, without guarding, and without rebound.   Rectal:  Deferred until time of colonoscopy.   Msk:  Symmetrical without gross deformities. Normal posture. Extremities:  Without  edema. Neurologic:  Alert and  oriented x4 to situation. Difficulty telling me the year or city.  Psych:  Cooperative but flat affect.   Intake/Output from previous day: No intake/output data recorded. Intake/Output this shift: No intake/output data recorded.  Lab Results: Recent Labs    05/31/19 1335  WBC 4.1  HGB 12.6  HCT 39.4  PLT 236   BMET Recent Labs    05/31/19 1335  NA 133*  K 3.7  CL 99  CO2 24  GLUCOSE 106*  BUN 16  CREATININE 0.42*  CALCIUM 9.3    Impression: 63 year old pleasant female who resides in a family care home, presenting with need for observation to complete colonoscopy prep. Per caretakers, prep was felt to be too difficult as outpatient and concern for compliance. Patient is limited historian but aware she is here for endoscopic procedures.   History of normocytic anemia: resolved now.   History of elevated transaminases: noted in Feb 2020. She has not completed repeat labs as requested. Will order now. Further evaluation as outpatient.   Weight loss: initially referred due to weight loss, but she has had documented weight gain as noted in HPI. Vague upper abdominal pain noted intermittently.   Will continue with plans for  colonoscopy/EGD with Dr. Darrick Penna on 06/01/2019. Propofol due to polypharmacy. She has been on clear liquids since yesterday. Barring any clinical changes, she should be appropriate for discharge home tomorrow after procedures. Discussed risks and benefits with patient at bedside. Will need to obtain  consent from caregiver, and nursing staff is aware.   Plan: Clear liquids today NPO after midnight except sips with meds Golytely 4 liters now Tap water enema X 2 in the morning Discontinue IV Heparin (DVT prophylaxis) after dose this evening Check HFP TCS/EGD with Dr. Darrick Penna on 06/01/2019, Propofol due to polypharmacy Hopeful discharge tomorrow after procedures  Gelene Mink, PhD, ANP-BC [facility]     LOS: 0 days    05/31/2019, 2:21 PM

## 2019-05-31 NOTE — Telephone Encounter (Signed)
AB advised pt hasn't arrived to hospital for admission. Called caregiver (Ms. Sheffield), she called hospital yesterday and was told pt could arrive by 12:00pm today. Reminded her we asked for her to arrive by 8:00am. She apologized and is taking pt to hospital now. AB informed.

## 2019-06-01 ENCOUNTER — Observation Stay (HOSPITAL_COMMUNITY): Payer: Medicare Other | Admitting: Anesthesiology

## 2019-06-01 ENCOUNTER — Encounter (HOSPITAL_COMMUNITY)
Admission: RE | Disposition: A | Discharge status: Home / Self Care | Payer: Self-pay | Source: Home / Self Care | Attending: Family Medicine

## 2019-06-01 ENCOUNTER — Encounter (HOSPITAL_COMMUNITY): Payer: Self-pay | Admitting: *Deleted

## 2019-06-01 DIAGNOSIS — Z1211 Encounter for screening for malignant neoplasm of colon: Secondary | ICD-10-CM

## 2019-06-01 DIAGNOSIS — Z9119 Patient's noncompliance with other medical treatment and regimen: Secondary | ICD-10-CM | POA: Diagnosis not present

## 2019-06-01 DIAGNOSIS — F209 Schizophrenia, unspecified: Secondary | ICD-10-CM | POA: Diagnosis not present

## 2019-06-01 DIAGNOSIS — K635 Polyp of colon: Secondary | ICD-10-CM | POA: Diagnosis not present

## 2019-06-01 DIAGNOSIS — R748 Abnormal levels of other serum enzymes: Secondary | ICD-10-CM | POA: Diagnosis not present

## 2019-06-01 DIAGNOSIS — Z20828 Contact with and (suspected) exposure to other viral communicable diseases: Secondary | ICD-10-CM | POA: Diagnosis not present

## 2019-06-01 DIAGNOSIS — K2970 Gastritis, unspecified, without bleeding: Secondary | ICD-10-CM | POA: Diagnosis not present

## 2019-06-01 DIAGNOSIS — R1013 Epigastric pain: Secondary | ICD-10-CM | POA: Diagnosis not present

## 2019-06-01 DIAGNOSIS — Z23 Encounter for immunization: Secondary | ICD-10-CM | POA: Diagnosis not present

## 2019-06-01 DIAGNOSIS — R569 Unspecified convulsions: Secondary | ICD-10-CM | POA: Diagnosis not present

## 2019-06-01 DIAGNOSIS — D649 Anemia, unspecified: Secondary | ICD-10-CM | POA: Diagnosis not present

## 2019-06-01 DIAGNOSIS — Z79899 Other long term (current) drug therapy: Secondary | ICD-10-CM | POA: Diagnosis not present

## 2019-06-01 DIAGNOSIS — A048 Other specified bacterial intestinal infections: Secondary | ICD-10-CM

## 2019-06-01 DIAGNOSIS — Z87898 Personal history of other specified conditions: Secondary | ICD-10-CM | POA: Diagnosis not present

## 2019-06-01 DIAGNOSIS — I10 Essential (primary) hypertension: Secondary | ICD-10-CM | POA: Diagnosis not present

## 2019-06-01 HISTORY — PX: COLONOSCOPY WITH PROPOFOL: SHX5780

## 2019-06-01 HISTORY — PX: BIOPSY: SHX5522

## 2019-06-01 HISTORY — PX: ESOPHAGOGASTRODUODENOSCOPY (EGD) WITH PROPOFOL: SHX5813

## 2019-06-01 HISTORY — DX: Other specified bacterial intestinal infections: A04.8

## 2019-06-01 LAB — GLUCOSE, CAPILLARY
Glucose-Capillary: 96 mg/dL (ref 70–99)
Glucose-Capillary: 101 mg/dL — ABNORMAL HIGH (ref 70–99)
Glucose-Capillary: 92 mg/dL (ref 70–99)
Glucose-Capillary: 123 mg/dL — ABNORMAL HIGH (ref 70–99)

## 2019-06-01 LAB — HIV ANTIBODY (ROUTINE TESTING W REFLEX): HIV Screen 4th Generation wRfx: NONREACTIVE

## 2019-06-01 SURGERY — COLONOSCOPY WITH PROPOFOL
Anesthesia: General

## 2019-06-01 MED ORDER — MIDAZOLAM HCL 2 MG/2ML IJ SOLN
INTRAMUSCULAR | Status: AC
  Filled 2019-06-01: qty 2

## 2019-06-01 MED ORDER — PHENYLEPHRINE 40 MCG/ML (10ML) SYRINGE FOR IV PUSH (FOR BLOOD PRESSURE SUPPORT)
PREFILLED_SYRINGE | INTRAVENOUS | Status: AC
  Filled 2019-06-01: qty 10

## 2019-06-01 MED ORDER — LACTATED RINGERS IV SOLN
INTRAVENOUS | Status: DC
  Administered 2019-06-01: 1000 mL via INTRAVENOUS

## 2019-06-01 MED ORDER — PHENYLEPHRINE HCL (PRESSORS) 10 MG/ML IV SOLN
INTRAVENOUS | Status: DC | PRN
  Administered 2019-06-01 (×3): 100 ug via INTRAVENOUS

## 2019-06-01 MED ORDER — PEG 3350-KCL-NA BICARB-NACL 420 G PO SOLR
4000.0000 mL | Freq: Once | ORAL | Status: AC
  Administered 2019-06-01: 4000 mL via ORAL

## 2019-06-01 MED ORDER — PROPOFOL 500 MG/50ML IV EMUL
INTRAVENOUS | Status: DC | PRN
  Administered 2019-06-01: 150 ug/kg/min via INTRAVENOUS

## 2019-06-01 MED ORDER — PROPOFOL 10 MG/ML IV BOLUS
INTRAVENOUS | Status: AC
  Filled 2019-06-01: qty 40

## 2019-06-01 MED ORDER — MIDAZOLAM HCL 5 MG/5ML IJ SOLN
INTRAMUSCULAR | Status: DC | PRN
  Administered 2019-06-01: 2 mg via INTRAVENOUS

## 2019-06-01 NOTE — Anesthesia Postprocedure Evaluation (Signed)
Anesthesia Post Note  Patient: LETETIA ROMANELLO  Procedure(s) Performed: COLONOSCOPY WITH PROPOFOL (N/A ) ESOPHAGOGASTRODUODENOSCOPY (EGD) WITH PROPOFOL (N/A ) BIOPSY  Patient location during evaluation: PACU Anesthesia Type: MAC Level of consciousness: awake and alert and oriented Pain management: pain level controlled Vital Signs Assessment: post-procedure vital signs reviewed and stable Respiratory status: spontaneous breathing, respiratory function stable and nonlabored ventilation Cardiovascular status: stable Postop Assessment: no apparent nausea or vomiting Anesthetic complications: no     Last Vitals:  Vitals:   06/01/19 0526 06/01/19 0920  BP: 134/89 (!) 157/102  Pulse: 70 73  Resp: 16 16  Temp:  36.5 C  SpO2: 99% 98%    Last Pain:  Vitals:   06/01/19 1020  TempSrc:   PainSc: 0-No pain                 Mellody Masri

## 2019-06-01 NOTE — Plan of Care (Signed)
Patient received two tap water enemas this a.m. Stool was liquid and tan. Will continue to monitor patient.

## 2019-06-01 NOTE — Care Management Obs Status (Signed)
[place] NOTIFICATION   Patient Details  Name: Stephanie Vincent MRN: [id number] Date of Birth: 12-10-1955   Medicare Observation Status Notification Given:  Yes    Tamecia Mcdougald, Chauncey Reading, RN 06/01/2019, 1:27 PM

## 2019-06-01 NOTE — Transfer of Care (Signed)
Immediate Anesthesia Transfer of Care Note  Patient: Stephanie Vincent  Procedure(s) Performed: COLONOSCOPY WITH PROPOFOL (N/A ) ESOPHAGOGASTRODUODENOSCOPY (EGD) WITH PROPOFOL (N/A ) BIOPSY  Patient Location: PACU  Anesthesia Type:MAC  Level of Consciousness: awake, alert , oriented and patient cooperative  Airway & Oxygen Therapy: Patient Spontanous Breathing  Post-op Assessment: Report given to RN, Post -op Vital signs reviewed and stable and Patient moving all extremities X 4  Post vital signs: Reviewed and stable  Last Vitals:  Vitals Value Taken Time  BP    Temp    Pulse 76 06/01/19 1049  Resp    SpO2 98 % 06/01/19 1049  Vitals shown include unvalidated device data.  Last Pain:  Vitals:   06/01/19 1020  TempSrc:   PainSc: 0-No pain      Patients Stated Pain Goal: 8 (49/20/10 0712)  Complications: No apparent anesthesia complications

## 2019-06-01 NOTE — H&P (Signed)
Primary Care Physician:  Avon Gully, MD Primary Gastroenterologist:  Dr. Darrick Penna  Pre-Procedure History & Physical: HPI:  Stephanie Vincent is a 63 y.o. female here for SCREENING/epigastric pain.  Past Medical History:  Diagnosis Date  . HTN (hypertension)   . Manic depression (HCC)   . Schizophrenia (HCC)   . Seizures (HCC)     Past Surgical History:  Procedure Laterality Date  . COLONOSCOPY  02/2009   Dr. Darrick Penna: slightly torturous colon otherwise normal     Prior to Admission medications   Medication Sig Start Date End Date Taking? Authorizing Provider  acetaminophen (TYLENOL) 500 MG tablet Take 500 mg by mouth daily as needed for mild pain.   Yes [provider]  clonazePAM (KLONOPIN) 1 MG tablet Take 1 mg by mouth 2 (two) times daily as needed (agitation).  12/08/15  Yes [provider]  divalproex (DEPAKOTE) 250 MG DR tablet Take 250 mg by mouth 2 (two) times daily.  12/15/15  Yes [provider]  donepezil (ARICEPT) 10 MG tablet Take 10 mg by mouth daily.  12/15/15  Yes [provider]  fluvoxaMINE (LUVOX) 100 MG tablet Take 100 mg by mouth at bedtime.  12/15/15  Yes [provider]  lisinopril (PRINIVIL,ZESTRIL) 10 MG tablet Take 10 mg by mouth daily.  12/15/15  Yes [provider]  memantine (NAMENDA) 10 MG tablet Take 10 mg by mouth 2 (two) times daily.  12/15/15  Yes [provider]  OLANZapine (ZYPREXA) 10 MG tablet Take 10 mg by mouth daily.   Yes [provider]  PHENobarbital (LUMINAL) 97.2 MG tablet Take 97.2 mg by mouth at bedtime.  12/09/15  Yes [provider]    Allergies as of 05/28/2019  . (No Known Allergies)    Family History  Problem Relation Age of Onset  . Colon cancer Neg Hx   . Colon polyps Neg Hx     Social History   Socioeconomic History  . Marital status: Single    Spouse name: Not on file  . Number of children: Not on file  . Years of education: Not on file   . Highest education level: Not on file  Occupational History  . Not on file  Social Needs  . Financial resource strain: Not hard at all  . Food insecurity    Worry: Never true    Inability: Never true  . Transportation needs    Medical: No    Non-medical: No  Tobacco Use  . Smoking status: Never Smoker  . Smokeless tobacco: Never Used  Substance and Sexual Activity  . Alcohol use: No  . Drug use: No  . Sexual activity: Not Currently  Lifestyle  . Physical activity    Days per week: 0 days    Minutes per session: 0 min  . Stress: Not at all  Relationships  . Social Musician on phone: Never    Gets together: Never    Attends religious service: Never    Active member of club or organization: No    Attends meetings of clubs or organizations: Never    Relationship status: Never married  . Intimate partner violence    Fear of current or ex partner: No    Emotionally abused: No    Physically abused: No    Forced sexual activity: No  Other Topics Concern  . Not on file  Social History Narrative  . Not on file    Review of Systems:  See HPI, otherwise negative ROS   Physical Exam: BP (!) 157/102   Pulse 73   Temp 97.7 F (36.5 C) (Oral)   Resp 16   Ht 5\' 3"  (1.6 m)   Wt 55.8 kg   SpO2 98%   BMI 21.79 kg/m  General:   Alert,  pleasant and cooperative in NAD Head:  Normocephalic and atraumatic. Neck:  Supple; Lungs:  Clear throughout to auscultation.    Heart:  Regular rate and rhythm. Abdomen:  Soft, nontender and nondistended. Normal bowel sounds, without guarding, and without rebound.   Neurologic:  Alert and  oriented x4;  grossly normal neurologically.  Impression/Plan:    SCREENING/epigastric pain  Plan:  1. TCS/EGD TODAY DISCUSSED PROCEDURE, BENEFITS, & RISKS: < 1% chance of medication reaction, bleeding, perforation, ASPIRATION, or rupture of spleen/liver requiring surgery to fix it and missed polyps < 1 cm 10-20% of the time.

## 2019-06-01 NOTE — Progress Notes (Signed)
Patient Demographics:    Stephanie Vincent, is a 63 y.o. female, DOB - 1956/05/12, ZOX:[id number]  Admit date - 05/31/2019   Admitting Physician Chealsey Miyamoto Mariea ClontsEmokpae, MD  Outpatient Primary MD for the patient is Avon GullyFanta, Tesfaye, MD  LOS - 0   No chief complaint on file.       Subjective:    Stephanie Vincent today has no fevers, no emesis,  No chest pain,  Poor colonoscopy prep  Assessment  & Plan :    Principal Problem:   History of weight loss Active Problems:   Depression   Elevated liver enzymes   Anemia   Schizophrenia (HCC)   Seizure (HCC)  Brief Summary:- 62 y.o.year old female with past medical history relevant for history of seizures, schizophrenia, depressive disorder, concerns about weight loss and anemia as well as elevated LFTs who was unable to follow-up as outpatient for further work-up of weight loss and elevated LFTs --Patient resides in a group home she has cognitive challenges She is on multiple neuropsychiatric medications GI service would like to do endoluminal evaluation colonoscopy plus minus EGD under propofol -Patient unable to safely complete colonoscopy prep at the group home due to cognitive challenges -Admitted 05/31/2019 to complete colonoscopy prep in the hospital. -Dr. fields attempted colonoscopy and flexible sigmoidoscopy on 06/01/2019 but the colon prep was too poor -Awaiting further colon prep and repeat colonoscopy on 06/02/2019 -EGD completed on 2018 2020 with gastritis    A/p 1)H/o Wt Loss--history of anemia, hemoglobin now 12.6, patient is a poor historian -GI consult appreciated p -Dr. fields attempted colonoscopy on 06/01/2019 however, Preparation of the colon was poor with Stool in the rectum, in the recto-sigmoid colon and in the sigmoid colon. --Plan is for colonoscopy  on 06/02/2019 -EGD findings below reflecting gastritis -Give PPI   2)Schizophrenia/Depression/Dementia-----stable continue Depakote 250 mg p.o. twice daily, Aricept 10 mg daily, Luvox 100 mg nightly, Namenda 10 mg twice daily, Zyprexa 10 mg daily, phenobarbital 97.2 mg nightly, as needed clonazepam  3)HTN--stable, continue lisinopril 10 mg daily,   may use IV labetalol when necessary  Every 4 hours for systolic blood pressure over 160 mmhg  4)Elevated LFT--- LFTs have now normalized defer to GI service  5)Gastritis/epigastric pain--EGD with gastritis of the gastric body and gastric atrium--biopsies pending  6)FEN--give dextrose solution IV due to prolonged n.p.o. status  Other--- patient has significant cognitive and psychiatric challenges, lives in a group home, has been unable to follow-up with the labs and appointments, unable to complete colonoscopy prep at the group home safely -Plan is for propofol for colonoscopy due to polypharmacy and potential difficulty with sedation -to complete colonoscopy prep and work-up for weight loss  Code Status : Full  Family Communication:    Discussed with Ms Hetty BlendMarneila Sheffield [phone])-  Disposition Plan  : Possibly back to group home on 06/02/2019  Procedures:- 06/01/2019 EGD with gastritis The examined esophagus was normal.      Localized mild inflammation was found in the gastric body and in the       gastric antrum. Biopsies were taken with a cold forceps for Helicobacter       pylori testing.      The examined duodenum was normal.  06/01/2019 -Incomplete colonoscopy due to  poor prep plan for repeat colonoscopy 06/02/2019  Consults  :  Gi  DVT Prophylaxis  :   - SCD (awaiting colonoscopy with possible biopsies)  Lab Results  Component Value Date   PLT 236 05/31/2019    Inpatient Medications  Scheduled Meds: . divalproex  250 mg Oral BID  . donepezil  10 mg Oral Daily  . fluvoxaMINE  100 mg Oral QHS  . influenza vac split quadrivalent PF  0.5 mL Intramuscular Tomorrow-1000  .  lisinopril  10 mg Oral Daily  . memantine  10 mg Oral BID  . OLANZapine  10 mg Oral Daily  . pantoprazole  40 mg Oral Daily  . PHENobarbital  97.2 mg Oral QHS  . polyethylene glycol-electrolytes  4,000 mL Oral Once  . sodium chloride flush  3 mL Intravenous Q12H   Continuous Infusions: . sodium chloride    . dextrose 5 % and 0.45% NaCl 40 mL/hr at 05/31/19 1244   PRN Meds:.sodium chloride, acetaminophen **OR** acetaminophen, acetaminophen, albuterol, clonazePAM, labetalol, ondansetron **OR** ondansetron (ZOFRAN) IV, polyethylene glycol, sodium chloride flush, traZODone    Anti-infectives (From admission, onward)   None        Objective:   Vitals:   06/01/19 1057 06/01/19 1100 06/01/19 1106 06/01/19 1115  BP: 109/65 119/73    Pulse: 74 72 78 87  Resp: 16 (!) 7 16 17   Temp:      TempSrc:      SpO2: 98% 98% 99% 95%  Weight:      Height:        Wt Readings from Last 3 Encounters:  05/31/19 55.8 kg  09/23/18 56 kg  09/17/18 56.7 kg     Intake/Output Summary (Last 24 hours) at 06/01/2019 1220 Last data filed at 06/01/2019 1100 Gross per 24 hour  Intake 1738.08 ml  Output 2000 ml  Net -261.92 ml    Physical Exam  Gen:- Awake Alert,  In no apparent distress  HEENT:- [place].AT, No sclera icterus Neck-Supple Neck,No JVD,.  Lungs-  CTAB , fair symmetrical air movement CV- S1, S2 normal, regular  Abd-  +ve B.Sounds, Abd Soft, mild epigastric discomfort without rebound or guarding extremity/Skin:- No  edema, pedal pulses present  Psych-baseline cognitive and memory deficits, neuro-no new focal deficits, no tremors   Data Review:   Micro Results Recent Results (from the past 240 hour(s))  SARS CORONAVIRUS 2 (TAT 6-24 HRS)     Status: None   Collection Time: 05/31/19  1:13 PM  Result Value Ref Range Status   SARS Coronavirus 2 NEGATIVE NEGATIVE Final    Comment: (NOTE) SARS-CoV-2 target nucleic acids are NOT DETECTED. The SARS-CoV-2 RNA is generally detectable in  upper and lower respiratory specimens during the acute phase of infection. Negative results do not preclude SARS-CoV-2 infection, do not rule out co-infections with other pathogens, and should not be used as the sole basis for treatment or other patient management decisions. Negative results must be combined with clinical observations, patient history, and epidemiological information. The expected result is Negative. Fact Sheet for Patients: SugarRoll.be Fact Sheet for Healthcare Providers: https://www.woods-mathews.com/ This test is not yet approved or cleared by the [place] FDA and  has been authorized for detection and/or diagnosis of SARS-CoV-2 by FDA under an Emergency Use Authorization (EUA). This EUA will remain  in effect (meaning this test can be used) for the duration of the COVID-19 declaration under Section 56 4(b)(1) of the Act, 21 U.S.C. section 360bbb-3(b)(1), unless the authorization is  terminated or revoked sooner. Performed at [facility], [address], [place], [place] [address]     Radiology Reports No results found.   CBC Recent Labs  Lab 05/31/19 1335  WBC 4.1  HGB 12.6  HCT 39.4  PLT 236  MCV 95.6  MCH 30.6  MCHC 32.0  RDW 11.8    Chemistries  Recent Labs  Lab 05/31/19 1335 05/31/19 1455  NA 133*  --   K 3.7  --   CL 99  --   CO2 24  --   GLUCOSE 106*  --   BUN 16  --   CREATININE 0.42*  --   CALCIUM 9.3  --   AST  --  21  ALT  --  17  ALKPHOS  --  47  BILITOT  --  0.3   ------------------------------------------------------------------------------------------------------------------ No results for input(s): CHOL, HDL, LDLCALC, TRIG, CHOLHDL, LDLDIRECT in the last 72 hours.  No results found for: HGBA1C ------------------------------------------------------------------------------------------------------------------ No results for input(s): TSH, T4TOTAL, T3FREE, THYROIDAB in  the last 72 hours.  Invalid input(s): FREET3 ------------------------------------------------------------------------------------------------------------------ No results for input(s): VITAMINB12, FOLATE, FERRITIN, TIBC, IRON, RETICCTPCT in the last 72 hours.  Coagulation profile No results for input(s): INR, PROTIME in the last 168 hours.  No results for input(s): DDIMER in the last 72 hours.  Cardiac Enzymes No results for input(s): CKMB, TROPONINI, MYOGLOBIN in the last 168 hours.  Invalid input(s): CK ------------------------------------------------------------------------------------------------------------------    Component Value Date/Time   BNP 13.2 09/23/2018 1248     Cherry Wittwer M.D on 06/01/2019 at 12:20 PM  Go to [email or web address] - for contact info  [facility] - Office  [phone]

## 2019-06-01 NOTE — Progress Notes (Signed)
Pt tolerated Golytely, has 480 cc left to drink. Receiving RN aware. Has had several BM's and a tap water enema but solid liq thick brown stool  still present.

## 2019-06-01 NOTE — Plan of Care (Signed)
Patient is alert and oriented to self, place, and situation. Patient complains of pain in the legs and arms. Medicated. Patient is incontinent x 2. Her skin is  clean, dry, and intact. Applied barrier cream to patient due to irritation to groin area. She remains NPO except sips with medications. Patient to receive two tap water enemas in the a.m. All safety interventions maintained. Will continue to monitor patient.

## 2019-06-01 NOTE — Op Note (Addendum)
[facility] Patient Name: Stephanie Vincent Procedure Date: 06/01/2019 9:54 AM MRN: [id number] Date of Birth: Nov 07, 1955 Attending MD: Barney Drain MD, MD CSN: [id number] Age: 63 Admit Type: Inpatient Procedure:                Colonoscopy ABORTED. Flexible Sigmoidoscopy WITH                            POORP PREP Indications:              Screening for colorectal malignant neoplasm Providers:                Barney Drain MD, MD, Otis Peak B. Sharon Seller, RN,                            Randa Spike, Technician Referring MD:             Rosita Fire MD, MD Medicines:                Propofol per Anesthesia Complications:            No immediate complications. Estimated Blood Loss:     Estimated blood loss: none. Procedure:                Pre-Anesthesia Assessment:                           - Prior to the procedure, a History and Physical                            was performed, and patient medications and                            allergies were reviewed. The patient's tolerance of                            previous anesthesia was also reviewed. The risks                            and benefits of the procedure and the sedation                            options and risks were discussed with the patient.                            All questions were answered, and informed consent                            was obtained. Prior Anticoagulants: The patient has                            taken no previous anticoagulant or antiplatelet                            agents. ASA Grade Assessment: II - A patient with  mild systemic disease. After reviewing the risks                            and benefits, the patient was deemed in                            satisfactory condition to undergo the procedure.                            After obtaining informed consent, the colonoscope                            was passed under direct vision. Throughout the                procedure, the patient's blood pressure, pulse, and                            oxygen saturations were monitored continuously. The                            [id number]-[id number] ([id number]) scope was introduced through                            the anus and advanced to the the sigmoid colon. The                            colonoscopy was performed with difficulty due to                            poor bowel prep with stool present. The patient                            tolerated the procedure well. The quality of the                            bowel preparation was poor. After obtaining                            informed consent, the colonoscope was passed under                            direct vision. Throughout the procedure, the                            patient's blood pressure, pulse, and oxygen                            saturations were monitored continuously. Scope In: 10:31:04 AM Scope Out: 10:35:13 AM Total Procedure Duration: 0 hours 4 minutes 9 seconds  Findings:      The digital rectal exam was normal. Pertinent negatives include normal       sphincter tone.      A large amount of semi-solid stool was found in the rectum, in the  recto-sigmoid colon and in the sigmoid colon, precluding visualization. Impression:               - Preparation of the colon was poor.                           - Stool in the rectum, in the recto-sigmoid colon                            and in the sigmoid colon. Moderate Sedation:      Per Anesthesia Care Recommendation:           - Return patient to hospital ward for ongoing care.                           - CONTINUE BOWEL PREP WITH ADDITIONAL 4Ls. TAP                            WATER ENEMAS x 2 ON OCT 14.                           - COLONOSCOPY WITH CONSCIOU SEDATION(PHENERGAN 6.25                            MG IV IN PREOP). Procedure Code(s):        --- Professional ---                           [phone], 53, Colonoscopy, flexible;  diagnostic,                            including collection of specimen(s) by brushing or                            washing, when performed (separate procedure) Diagnosis Code(s):        --- Professional ---                           Z12.11, Encounter for screening for malignant                            neoplasm of colon CPT copyright 2019 American Medical Association. All rights reserved. The codes documented in this report are preliminary and upon coder review may  be revised to meet current compliance requirements. Stephanie EvaSandi Kolbie Lepkowski, MD Stephanie EvaSandi Hayze Gazda MD, MD 06/01/2019 11:06:29 AM This report has been signed electronically. Number of Addenda: 0

## 2019-06-01 NOTE — Anesthesia Preprocedure Evaluation (Signed)
Anesthesia Evaluation  Patient identified by MRN, date of birth, ID band Patient awake  General Assessment Comment:Schizophrenic NH pt -consent obtained and witnessed  Poor historian  Mostly h/o gleaned from chart  Here for EGD/Colon for weight loss w/u Admitted for  Bowel prep  Reviewed: Allergy & Precautions, NPO status , Patient's Chart, lab work & pertinent test results  Airway Mallampati: II  TM Distance: >3 FB Neck ROM: Full    Dental no notable dental hx. (+) Teeth Intact   Pulmonary neg pulmonary ROS,    Pulmonary exam normal breath sounds clear to auscultation       Cardiovascular Exercise Tolerance: Good hypertension, Pt. on medications negative cardio ROS Normal cardiovascular examI Rhythm:Regular Rate:Normal     Neuro/Psych Seizures -, Well Controlled,  PSYCHIATRIC DISORDERS Depression Bipolar Disorder Schizophrenia    GI/Hepatic negative GI ROS, Neg liver ROS, Bowel prep,  Endo/Other  negative endocrine ROS  Renal/GU negative Renal ROS  negative genitourinary   Musculoskeletal negative musculoskeletal ROS (+)   Abdominal   Peds negative pediatric ROS (+)  Hematology negative hematology ROS (+) anemia ,   Anesthesia Other Findings   Reproductive/Obstetrics negative OB ROS                             Anesthesia Physical Anesthesia Plan  ASA: III  Anesthesia Plan: General   Post-op Pain Management:    Induction: Intravenous  PONV Risk Score and Plan: 3 and TIVA, Propofol infusion, Ondansetron, Dexamethasone and Treatment may vary due to age or medical condition  Airway Management Planned: Simple Face Mask and Nasal Cannula  Additional Equipment:   Intra-op Plan:   Post-operative Plan:   Informed Consent: I have reviewed the patients History and Physical, chart, labs and discussed the procedure including the risks, benefits and alternatives for the proposed  anesthesia with the patient or authorized representative who has indicated his/her understanding and acceptance.     Dental advisory given  Plan Discussed with: CRNA  Anesthesia Plan Comments: (Plan Full PPE use  Plan GA with GETA as needed d/w pt -WTP with same after Q&A   RN Witnessed consent obtained by phone )        Anesthesia Quick Evaluation

## 2019-06-01 NOTE — Op Note (Signed)
[facility] Patient Name: Stephanie Vincent Procedure Date: 06/01/2019 10:36 AM MRN: [id number] Date of Birth: Feb 04, 1956 Attending MD: Barney Drain MD, MD CSN: [id number] Age: 63 Admit Type: Inpatient Procedure:                Upper GI endoscopy WITH COLD FORCEPS BIOPSY Indications:              Epigastric abdominal pain Providers:                Barney Drain MD, MD, Gwenlyn Fudge RN, RN, Randa Spike, Technician Referring MD:             Rosita Fire MD, MD Medicines:                Propofol per Anesthesia Complications:            No immediate complications. Estimated Blood Loss:     Estimated blood loss was minimal. Procedure:                Pre-Anesthesia Assessment:                           - Prior to the procedure, a History and Physical                            was performed, and patient medications and                            allergies were reviewed. The patient's tolerance of                            previous anesthesia was also reviewed. The risks                            and benefits of the procedure and the sedation                            options and risks were discussed with the patient.                            All questions were answered, and informed consent                            was obtained. Prior Anticoagulants: The patient has                            taken heparin, last dose was 1 day prior to                            procedure. ASA Grade Assessment: II - A patient                            with mild systemic disease. After reviewing the  risks and benefits, the patient was deemed in                            satisfactory condition to undergo the procedure.                            After obtaining informed consent, the endoscope was                            passed under direct vision. Throughout the                            procedure, the patient's blood pressure, pulse,  and                            oxygen saturations were monitored continuously. The                            [id number] ([id number]) was introduced through the                            mouth, and advanced to the second part of duodenum.                            The upper GI endoscopy was accomplished without                            difficulty. The patient tolerated the procedure                            well. Scope In: 10:39:02 AM Scope Out: 10:43:06 AM Total Procedure Duration: 0 hours 4 minutes 4 seconds  Findings:      The examined esophagus was normal.      Localized mild inflammation was found in the gastric body and in the       gastric antrum. Biopsies were taken with a cold forceps for Helicobacter       pylori testing.      The examined duodenum was normal. Impression:               - ABDOMINAL PAIN MOST LIKELY DUE TO MILD Gastritis.                            Biopsied. Moderate Sedation:      Per Anesthesia Care Recommendation:           - Return patient to hospital ward for ongoing care.                           - Clear liquid diet. CONTINUE PREP FOR COLONOSCOPY.                           - NPO AFTE MN EXCEPT MEDS..                           - Continue present medications. PROTONIX  DAILY.                           - Await pathology results.                           - Return to GI office in 6 months. Procedure Code(s):        --- Professional ---                           [id number], Esophagogastroduodenoscopy, flexible,                            transoral; with biopsy, single or multiple Diagnosis Code(s):        --- Professional ---                           K29.70, Gastritis, unspecified, without bleeding                           R10.13, Epigastric pain CPT copyright 2019 American Medical Association. All rights reserved. The codes documented in this report are preliminary and upon coder review may  be revised to meet current compliance requirements. [name],  MD [name] MD, MD 06/01/2019 11:01:27 AM This report has been signed electronically. Number of Addenda: 0

## 2019-06-02 ENCOUNTER — Encounter (HOSPITAL_COMMUNITY)
Admission: RE | Disposition: A | Discharge status: Home / Self Care | Payer: Self-pay | Source: Home / Self Care | Attending: Family Medicine

## 2019-06-02 ENCOUNTER — Encounter (HOSPITAL_COMMUNITY): Payer: Self-pay | Admitting: *Deleted

## 2019-06-02 DIAGNOSIS — K635 Polyp of colon: Secondary | ICD-10-CM

## 2019-06-02 DIAGNOSIS — Z1211 Encounter for screening for malignant neoplasm of colon: Secondary | ICD-10-CM

## 2019-06-02 DIAGNOSIS — R748 Abnormal levels of other serum enzymes: Secondary | ICD-10-CM

## 2019-06-02 DIAGNOSIS — F209 Schizophrenia, unspecified: Secondary | ICD-10-CM

## 2019-06-02 DIAGNOSIS — R569 Unspecified convulsions: Secondary | ICD-10-CM

## 2019-06-02 HISTORY — PX: POLYPECTOMY: SHX5525

## 2019-06-02 HISTORY — PX: COLONOSCOPY: SHX5424

## 2019-06-02 LAB — GLUCOSE, CAPILLARY
Glucose-Capillary: 107 mg/dL — ABNORMAL HIGH (ref 70–99)
Glucose-Capillary: 119 mg/dL — ABNORMAL HIGH (ref 70–99)
Glucose-Capillary: 145 mg/dL — ABNORMAL HIGH (ref 70–99)
Glucose-Capillary: 90 mg/dL (ref 70–99)
Glucose-Capillary: 98 mg/dL (ref 70–99)

## 2019-06-02 LAB — SURGICAL PATHOLOGY

## 2019-06-02 SURGERY — COLONOSCOPY
Anesthesia: Moderate Sedation

## 2019-06-02 MED ORDER — MEPERIDINE HCL 50 MG/ML IJ SOLN
INTRAMUSCULAR | Status: AC
  Filled 2019-06-02: qty 1

## 2019-06-02 MED ORDER — CLONAZEPAM 1 MG PO TABS: 1.0000 mg | ORAL_TABLET | Freq: Two times a day (BID) | ORAL | 0 refills | Status: DC | PRN

## 2019-06-02 MED ORDER — SODIUM CHLORIDE 0.9 % IV SOLN
INTRAVENOUS | Status: DC
  Administered 2019-06-02: 1000 mL via INTRAVENOUS

## 2019-06-02 MED ORDER — HYDROCODONE-ACETAMINOPHEN 7.5-325 MG PO TABS: 1.0000 | ORAL_TABLET | Freq: Once | ORAL | Status: DC | PRN

## 2019-06-02 MED ORDER — PEG 3350-KCL-NA BICARB-NACL 420 G PO SOLR
4000.0000 mL | Freq: Once | ORAL | Status: AC
  Administered 2019-06-02: 4000 mL via ORAL

## 2019-06-02 MED ORDER — PROMETHAZINE HCL 25 MG/ML IJ SOLN: 6.2500 mg | INTRAMUSCULAR | Status: DC | PRN

## 2019-06-02 MED ORDER — MIDAZOLAM HCL 5 MG/5ML IJ SOLN
INTRAMUSCULAR | Status: DC | PRN
  Administered 2019-06-02 (×2): 1 mg via INTRAVENOUS
  Administered 2019-06-02: 2 mg via INTRAVENOUS

## 2019-06-02 MED ORDER — ONDANSETRON HCL 4 MG/2ML IJ SOLN
INTRAMUSCULAR | Status: AC
  Filled 2019-06-02: qty 2

## 2019-06-02 MED ORDER — MIDAZOLAM HCL 2 MG/2ML IJ SOLN: 0.5000 mg | Freq: Once | INTRAMUSCULAR | Status: DC | PRN

## 2019-06-02 MED ORDER — HYDROMORPHONE HCL 1 MG/ML IJ SOLN: 0.2500 mg | INTRAMUSCULAR | Status: DC | PRN

## 2019-06-02 MED ORDER — MEPERIDINE HCL 100 MG/ML IJ SOLN
INTRAMUSCULAR | Status: DC | PRN
  Administered 2019-06-02: 15 mg via INTRAVENOUS
  Administered 2019-06-02: 10 mg via INTRAVENOUS
  Administered 2019-06-02: 25 mg via INTRAVENOUS

## 2019-06-02 MED ORDER — ONDANSETRON HCL 4 MG/2ML IJ SOLN
INTRAMUSCULAR | Status: DC | PRN
  Administered 2019-06-02: 4 mg via INTRAVENOUS

## 2019-06-02 MED ORDER — PANTOPRAZOLE SODIUM 40 MG PO TBEC: 40.0000 mg | DELAYED_RELEASE_TABLET | Freq: Every day | ORAL | 1 refills | Status: DC

## 2019-06-02 MED ORDER — MIDAZOLAM HCL 5 MG/5ML IJ SOLN
INTRAMUSCULAR | Status: AC
  Filled 2019-06-02: qty 10

## 2019-06-02 MED ORDER — STERILE WATER FOR IRRIGATION IR SOLN
Status: DC | PRN
  Administered 2019-06-02: 1.5 mL

## 2019-06-02 MED ORDER — SODIUM CHLORIDE 0.9 % IV SOLN: INTRAVENOUS | Status: DC

## 2019-06-02 NOTE — Progress Notes (Signed)
Patient received tap water enema and tolerated it well. No acute distress noted. Will continue to monitor.

## 2019-06-02 NOTE — Interval H&P Note (Signed)
History and Physical Interval Note:  06/02/2019 2:09 PM  Stephanie Vincent  has presented today for surgery, with the diagnosis of history of weight loss; history of anemia; colon cancer screening.  The various methods of treatment have been discussed with the patient and family. After consideration of risks, benefits and other options for treatment, the patient has consented to  Procedure(s) with comments: COLONOSCOPY (N/A) - Phenergan 6.25 mg IV  in pre-op as a surgical intervention.  The patient's history has been reviewed, patient examined, no change in status, stable for surgery.  I have reviewed the patient's chart and labs.  Questions were answered to the patient's satisfaction.     Manus Rudd  Patient received another 4 L of prep this morning( 8 L total since attempted TCS yesterday).  Running clear.  Plan for a screening colonoscopy today,  conscious sedation per plan.  The risks, benefits, limitations, alternatives and imponderables have been reviewed with the patient. Questions have been answered. All parties are agreeable.

## 2019-06-02 NOTE — NC FL2 (Signed)
[place] MEDICAID FL2 LEVEL OF CARE SCREENING TOOL     IDENTIFICATION  Patient Name: Stephanie Vincent Birthdate: 11-Dec-1955 Sex: female Admission Date (Current Location): 05/31/2019  [place] and [place] Number:  Aaron Edelman [id number] Facility and Address:  [facility],  [address] [place] [address]      Provider Number: [id number]  Attending Physician Name and Address:  Cleora Fleet, MD  Relative Name and Phone Number:  Hetty Blend [phone]    Current Level of Care: Hospital Recommended Level of Care: Family Care Home Prior Approval Number:    Date Approved/Denied:   PASRR Number:    Discharge Plan: Home    Current Diagnoses: Patient Active Problem List   Diagnosis Date Noted  . Anemia 05/31/2019  . Schizophrenia (HCC) 05/31/2019  . Seizure (HCC) 05/31/2019  . History of weight loss   . Elevated liver enzymes 11/23/2018  . Normocytic anemia 11/23/2018  . Loss of weight 11/23/2018  . Depression 09/24/2018    Orientation RESPIRATION BLADDER Height & Weight     Self, Place  Normal Continent Weight: 55.8 kg Height:  5\' 3"  (160 cm)  BEHAVIORAL SYMPTOMS/MOOD NEUROLOGICAL BOWEL NUTRITION STATUS      Continent Diet  AMBULATORY STATUS COMMUNICATION OF NEEDS Skin   Independent Verbally Normal                       Personal Care Assistance Level of Assistance  Feeding, Bathing, Dressing Bathing Assistance: Limited assistance Feeding assistance: Limited assistance Dressing Assistance: Limited assistance     Functional Limitations Info  Sight, Hearing, Speech Sight Info: Adequate Hearing Info: Adequate Speech Info: Adequate    SPECIAL CARE FACTORS FREQUENCY                       Contractures Contractures Info: Not present    Additional Factors Info  Psychotropic Code Status Info: Full code Allergies Info: NKA Psychotropic Info: Klonopin, Depakote, Aricept, Luvox, Zyprexa         Current  Medications (06/02/2019):  This is the current hospital active medication list Current Facility-Administered Medications  Medication Dose Route Frequency Provider Last Rate Last Dose  . 0.9 %  sodium chloride infusion  250 mL Intravenous PRN 06/04/2019, MD      . acetaminophen (TYLENOL) tablet 650 mg  650 mg Oral Q6H PRN Corbin Ade, MD   650 mg at 06/01/19 2317   Or  . acetaminophen (TYLENOL) suppository 650 mg  650 mg Rectal Q6H PRN 06/03/19, MD      . acetaminophen (TYLENOL) tablet 500 mg  500 mg Oral Daily PRN Corbin Ade, MD   500 mg at 06/01/19 1306  . albuterol (PROVENTIL) (2.5 MG/3ML) 0.083% nebulizer solution 2.5 mg  2.5 mg Nebulization Q2H PRN Rourk, 06/03/19, MD      . clonazePAM [facility]) tablet 1 mg  1 mg Oral BID PRN Scarlette Calico, MD   1 mg at 06/01/19 2140  . dextrose 5 %-0.45 % sodium chloride infusion   Intravenous Continuous 2141, MD 100 mL/hr at 06/02/19 0919    . divalproex (DEPAKOTE) DR tablet 250 mg  250 mg Oral BID 06/04/19, MD   250 mg at 06/02/19 06/04/19  . donepezil (ARICEPT) tablet 10 mg  10 mg Oral Daily [id number], MD   10 mg at 06/02/19 06/04/19  . fluvoxaMINE (LUVOX) tablet 100 mg  100 mg Oral QHS Daneil Dolin, MD   100 mg at 06/01/19 2300  . labetalol (NORMODYNE) injection 10 mg  10 mg Intravenous Q4H PRN Daneil Dolin, MD      . lisinopril (ZESTRIL) tablet 10 mg  10 mg Oral Daily Daneil Dolin, MD   10 mg at 06/02/19 5093  . memantine (NAMENDA) tablet 10 mg  10 mg Oral BID Daneil Dolin, MD   10 mg at 06/02/19 2671  . meperidine (DEMEROL) 50 MG/ML injection           . midazolam (VERSED) 5 MG/5ML injection           . OLANZapine (ZYPREXA) tablet 10 mg  10 mg Oral Daily Daneil Dolin, MD   10 mg at 06/02/19 2458  . ondansetron (ZOFRAN) 4 MG/2ML injection           . ondansetron (ZOFRAN) tablet 4 mg  4 mg Oral Q6H PRN Daneil Dolin, MD       Or  . ondansetron [facility]) injection 4 mg  4 mg Intravenous Q6H PRN  Daneil Dolin, MD      . pantoprazole (PROTONIX) EC tablet 40 mg  40 mg Oral Daily Daneil Dolin, MD   40 mg at 06/02/19 0998  . PHENobarbital (LUMINAL) tablet 97.2 mg  97.2 mg Oral QHS Daneil Dolin, MD   97.2 mg at 06/01/19 2141  . polyethylene glycol (MIRALAX / GLYCOLAX) packet 17 g  17 g Oral Daily PRN Rourk, Cristopher Estimable, MD      . sodium chloride flush (NS) 0.9 % injection 3 mL  3 mL Intravenous Q12H Daneil Dolin, MD   3 mL at 05/31/19 1251  . sodium chloride flush (NS) 0.9 % injection 3 mL  3 mL Intravenous PRN Rourk, Cristopher Estimable, MD      . traZODone (DESYREL) tablet 50 mg  50 mg Oral QHS PRN Daneil Dolin, MD         Discharge Medications: Medication List    TAKE these medications   acetaminophen 500 MG tablet Commonly known as: TYLENOL Take 500 mg by mouth daily as needed for mild pain.   clonazePAM 1 MG tablet Commonly known as: KLONOPIN Take 1 tablet (1 mg total) by mouth 2 (two) times daily as needed (agitation).   divalproex 250 MG DR tablet Commonly known as: DEPAKOTE Take 250 mg by mouth 2 (two) times daily.   donepezil 10 MG tablet Commonly known as: ARICEPT Take 10 mg by mouth daily.   fluvoxaMINE 100 MG tablet Commonly known as: LUVOX Take 100 mg by mouth at bedtime.   lisinopril 10 MG tablet Commonly known as: ZESTRIL Take 10 mg by mouth daily.   memantine 10 MG tablet Commonly known as: NAMENDA Take 10 mg by mouth 2 (two) times daily.   OLANZapine 10 MG tablet Commonly known as: ZYPREXA Take 10 mg by mouth daily.   pantoprazole 40 MG tablet Commonly known as: PROTONIX Take 1 tablet (40 mg total) by mouth daily. Start taking on: June 03, 2019   PHENobarbital 97.2 MG tablet Commonly known as: LUMINAL Take 97.2 mg by mouth at bedtime.     Relevant Imaging Results:  Relevant Lab Results:   Additional Information not on file  Tiffanye Hartmann, Chauncey Reading, RN

## 2019-06-02 NOTE — Discharge Instructions (Signed)
°  Colonoscopy Discharge Instructions  Read the instructions outlined below and refer to this sheet in the next few weeks. These discharge instructions provide you with general information on caring for yourself after you leave the hospital. Your doctor may also give you specific instructions. While your treatment has been planned according to the most current medical practices available, unavoidable complications occasionally occur. If you have any problems or questions after discharge, call Dr. Gala Romney at [phone]. ACTIVITY  You may resume your regular activity, but move at a slower pace for the next 24 hours.   Take frequent rest periods for the next 24 hours.   Walking will help get rid of the air and reduce the bloated feeling in your belly (abdomen).   No driving for 24 hours (because of the medicine (anesthesia) used during the test).    Do not sign any important legal documents or operate any machinery for 24 hours (because of the anesthesia used during the test).  NUTRITION  Drink plenty of fluids.   You may resume your normal diet as instructed by your doctor.   Begin with a light meal and progress to your normal diet. Heavy or fried foods are harder to digest and may make you feel sick to your stomach (nauseated).   Avoid alcoholic beverages for 24 hours or as instructed.  MEDICATIONS  You may resume your normal medications unless your doctor tells you otherwise.  WHAT YOU CAN EXPECT TODAY  Some feelings of bloating in the abdomen.   Passage of more gas than usual.   Spotting of blood in your stool or on the toilet paper.  IF YOU HAD POLYPS REMOVED DURING THE COLONOSCOPY:  No aspirin products for 7 days or as instructed.   No alcohol for 7 days or as instructed.   Eat a soft diet for the next 24 hours.  FINDING OUT THE RESULTS OF YOUR TEST Not all test results are available during your visit. If your test results are not back during the visit, make an appointment  with your caregiver to find out the results. Do not assume everything is normal if you have not heard from your caregiver or the medical facility. It is important for you to follow up on all of your test results.  SEEK IMMEDIATE MEDICAL ATTENTION IF:  You have more than a spotting of blood in your stool.   Your belly is swollen (abdominal distention).   You are nauseated or vomiting.   You have a temperature over 101.   You have abdominal pain or discomfort that is severe or gets worse throughout the day.   Colon polyp information provided  Further recommendations to follow pending review pathology report  Follow-up appointment with Dr. Oneida Alar in 4 to 6 weeks if not already scheduled  I discussed my findings and recommendations with [place] at [phone]

## 2019-06-02 NOTE — Progress Notes (Addendum)
Spoke with nursing staff. Patient completed additional 4 L of golytely yesterday and 2 enemas. She continues to have brown liquid stool with some particles. She is scheduled to receive 2 additional enemas this morning. Nursing staff to let me know how stools are after enemas.   Addendum: Spoke with Dr. Gala Romney. Will go ahead and give additional Golytely prep.This is essentially a screening colonoscopy at this point. Need to ensure patient is adequately prepped. Will reassess around lunch.  Will discus with the nursing staff about ensuring patient is drinking at an adequate rate.

## 2019-06-02 NOTE — Progress Notes (Signed)
Patient Demographics:    Stephanie Vincent, is a 63 y.o. female, DOB - April 06, 1956, YCX:[id number]  Admit date - 05/31/2019   Admitting Physician Stephanie Mariea Clonts, MD  Outpatient Primary MD for the patient is Stephanie Gully, MD  LOS - 1   No chief complaint on file.       Subjective:    Stephanie Vincent today has no fevers, no emesis,  No chest pain,  Poor colonoscopy prep  Assessment  & Plan :    Principal Problem:   History of weight loss Active Problems:   Depression   Elevated liver enzymes   Anemia   Schizophrenia (HCC)   Seizure (HCC)  Brief Summary:- 62 y.o.year old female with past medical history relevant for history of seizures, schizophrenia, depressive disorder, concerns about weight loss and anemia as well as elevated LFTs who was unable to follow-up as outpatient for further work-up of weight loss and elevated LFTs --Patient resides in a group home she has cognitive challenges She is on multiple neuropsychiatric medications GI service would like to do endoluminal evaluation colonoscopy plus minus EGD under propofol -Patient unable to safely complete colonoscopy prep at the group home due to cognitive challenges -Admitted 05/31/2019 to complete colonoscopy prep in the hospital. -Dr. fields attempted colonoscopy and flexible sigmoidoscopy on 06/01/2019 but the colon prep was too poor -Awaiting further colon prep and repeat colonoscopy on 06/02/2019 -EGD completed on 2018 2020 with gastritis    A/p 1)H/o Wt Loss--history of anemia, hemoglobin now 12.6, patient is a poor historian -GI consult appreciated p -Dr. fields attempted colonoscopy on 06/01/2019 however, Preparation of the colon was poor with Stool in the rectum, in the recto-sigmoid colon and in the sigmoid colon. --Plan is for colonoscopy 06/02/2019 -EGD findings below reflecting gastritis -continue  PPI  2)Schizophrenia/Depression/Dementia-----stable continue Depakote 250 mg p.o. twice daily, Aricept 10 mg daily, Luvox 100 mg nightly, Namenda 10 mg twice daily, Zyprexa 10 mg daily, phenobarbital 97.2 mg nightly, as needed clonazepam  3)HTN--stable, continue lisinopril 10 mg daily,   may use IV labetalol when necessary  Every 4 hours for systolic blood pressure over 160 mmhg  4)Elevated LFT--- LFTs have now normalized defer to GI service  5)Gastritis/epigastric pain--EGD with gastritis of the gastric body and gastric atrium--biopsies pending  6)FEN--give dextrose solution IV due to prolonged n.p.o. status  Other--- patient has significant cognitive and psychiatric challenges, lives in a group home, has been unable to follow-up with the labs and appointments, unable to complete colonoscopy prep at the group home safely -Plan is for propofol for colonoscopy due to polypharmacy and potential difficulty with sedation -to complete colonoscopy prep and work-up for weight loss  Code Status : Full  Family Communication:    Discussed with Stephanie Vincent [phone])-  Disposition Plan  : Possibly back to group home on 06/03/2019  Procedures:- 06/01/2019 EGD with gastritis The examined esophagus was normal.      Localized mild inflammation was found in the gastric body and in the       gastric antrum. Biopsies were taken with a cold forceps for Helicobacter       pylori testing.      The examined duodenum was normal.  06/01/2019 -Incomplete colonoscopy due to poor prep  plan for repeat colonoscopy 06/02/2019 per GI team  Consults  :  GI Stephanie Vincent/Stephanie Vincent  DVT Prophylaxis  :   - SCD (awaiting colonoscopy with possible biopsies)  Lab Results  Component Value Date   PLT 236 05/31/2019    Inpatient Medications  Scheduled Meds:  [MAR Hold] divalproex  250 mg Oral BID   [MAR Hold] donepezil  10 mg Oral Daily   [MAR Hold] fluvoxaMINE  100 mg Oral QHS   [MAR Hold]  lisinopril  10 mg Oral Daily   [MAR Hold] memantine  10 mg Oral BID   meperidine       midazolam       [MAR Hold] OLANZapine  10 mg Oral Daily   ondansetron       [MAR Hold] pantoprazole  40 mg Oral Daily   [MAR Hold] PHENobarbital  97.2 mg Oral QHS   [MAR Hold] sodium chloride flush  3 mL Intravenous Q12H   Continuous Infusions:  [MAR Hold] sodium chloride     sodium chloride     sodium chloride 1,000 mL (06/02/19 1345)   dextrose 5 % and 0.45% NaCl 100 mL/hr at 06/02/19 0919   PRN Meds:.[MAR Hold] sodium chloride, [MAR Hold] acetaminophen **OR** [MAR Hold] acetaminophen, [MAR Hold] acetaminophen, [MAR Hold] albuterol, [MAR Hold] clonazePAM, HYDROcodone-acetaminophen, HYDROmorphone (DILAUDID) injection, [MAR Hold] labetalol, meperidine, midazolam, midazolam, [MAR Hold] ondansetron **OR** [MAR Hold] ondansetron (ZOFRAN) IV, ondansetron, [MAR Hold] polyethylene glycol, promethazine, simethicone susp in sterile water 1000 mL irrigation, [MAR Hold] sodium chloride flush, [MAR Hold] traZODone    Anti-infectives (From admission, onward)   None        Objective:   Vitals:   06/02/19 1420 06/02/19 1425 06/02/19 1430 06/02/19 1435  BP: 115/72 114/64 (!) 87/49   Pulse: 82 91 92 93  Resp: 15 17 18 18   Temp:      TempSrc:      SpO2: 100% 100% 100% 100%  Weight:      Height:        Wt Readings from Last 3 Encounters:  05/31/19 55.8 kg  09/23/18 56 kg  09/17/18 56.7 kg     Intake/Output Summary (Last 24 hours) at 06/02/2019 1439 Last data filed at 06/02/2019 0651 Gross per 24 hour  Intake 2161.92 ml  Output --  Net 2161.92 ml    Physical Exam  Gen:- Awake Alert,  In no apparent distress  HEENT:- [place].AT, No sclera icterus Neck-Supple Neck,No JVD,.  Lungs-  CTAB , fair symmetrical air movement CV- S1, S2 normal, regular  Abd-  +ve B.Sounds, Abd Soft, mild epigastric discomfort without rebound or guarding extremity/Skin:- No  edema, pedal pulses present   Psych-baseline cognitive and memory deficits, neuro-no new focal deficits, no tremors   Data Review:   Micro Results Recent Results (from the past 240 hour(s))  SARS CORONAVIRUS 2 (TAT 6-24 HRS)     Status: None   Collection Time: 05/31/19  1:13 PM  Result Value Ref Range Status   SARS Coronavirus 2 NEGATIVE NEGATIVE Final    Comment: (NOTE) SARS-CoV-2 target nucleic acids are NOT DETECTED. The SARS-CoV-2 RNA is generally detectable in upper and lower respiratory specimens during the acute phase of infection. Negative results do not preclude SARS-CoV-2 infection, do not rule out co-infections with other pathogens, and should not be used as the sole basis for treatment or other patient management decisions. Negative results must be combined with clinical observations, patient history, and epidemiological information. The expected result is Negative. Fact Sheet  for Patients: HairSlick.nohttps://www.fda.gov/media/138098/download Fact Sheet for Healthcare Providers: quierodirigir.comhttps://www.fda.gov/media/138095/download This test is not yet approved or cleared by the [place] FDA and  has been authorized for detection and/or diagnosis of SARS-CoV-2 by FDA under an Emergency Use Authorization (EUA). This EUA will remain  in effect (meaning this test can be used) for the duration of the COVID-19 declaration under Section 56 4(b)(1) of the Act, 21 U.S.C. section 360bbb-3(b)(1), unless the authorization is terminated or revoked sooner. Performed at [facility], [address] [place], [place] [address]     Radiology Reports No results found.   CBC Recent Labs  Lab 05/31/19 1335  WBC 4.1  HGB 12.6  HCT 39.4  PLT 236  MCV 95.6  MCH 30.6  MCHC 32.0  RDW 11.8    Chemistries  Recent Labs  Lab 05/31/19 1335 05/31/19 1455  NA 133*  --   K 3.7  --   CL 99  --   CO2 24  --   GLUCOSE 106*  --   BUN 16  --   CREATININE 0.42*  --   CALCIUM 9.3  --   AST  --  21  ALT  --  17   ALKPHOS  --  47  BILITOT  --  0.3   ------------------------------------------------------------------------------------------------------------------ No results for input(s): CHOL, HDL, LDLCALC, TRIG, CHOLHDL, LDLDIRECT in the last 72 hours.  No results found for: HGBA1C ------------------------------------------------------------------------------------------------------------------ No results for input(s): TSH, T4TOTAL, T3FREE, THYROIDAB in the last 72 hours.  Invalid input(s): FREET3 ------------------------------------------------------------------------------------------------------------------ No results for input(s): VITAMINB12, FOLATE, FERRITIN, TIBC, IRON, RETICCTPCT in the last 72 hours.  Coagulation profile No results for input(s): INR, PROTIME in the last 168 hours.  No results for input(s): DDIMER in the last 72 hours.  Cardiac Enzymes No results for input(s): CKMB, TROPONINI, MYOGLOBIN in the last 168 hours.  Invalid input(s): CK ------------------------------------------------------------------------------------------------------------------    Component Value Date/Time   BNP 13.2 09/23/2018 1248   Chiana Wamser M.D on 06/02/2019 at 2:39 PM  How to contact the [facility] Attending or Consulting provider 7A - 7P or covering provider during after hours 7P -7A, for this patient?  1. Check the care team in [facility] and look for a) attending/consulting [facility] provider listed and b) the [facility] team listed 2. Log into [email or web address] and use [place]'s universal password to access. If you do not have the password, please contact the hospital operator. 3. Locate the [facility] provider you are looking for under [facility] and page to a number that you can be directly reached. 4. If you still have difficulty reaching the provider, please page the [facility] (Director on Call) for the Hospitalists listed on amion for assistance.   [facility] - Office  [phone]

## 2019-06-02 NOTE — Discharge Summary (Signed)
Physician Discharge Summary  Stephanie Vincent QZR:[id number] DOB: Aug 08, 1956 DOA: 05/31/2019  PCP: Avon Gully, MD GI: [facility]  Admit date: 05/31/2019 Discharge date: 06/02/2019  Admitted From: Group Home  Disposition: Same   Recommendations for Outpatient Follow-up:  1. Follow up with PCP in 1 weeks 2. Follow up with GI in 6 weeks  Discharge Condition: STABLE   CODE STATUS: FULL    Brief Hospitalization Summary: Please see all hospital notes, images, labs for full details of the hospitalization. Brief Summary:- 63 y.o.year old femalewith past medical history relevant for history of seizures, schizophrenia, depressive disorder, concerns about weight loss and anemia as well as elevated LFTs who was unable to follow-up as outpatient for further work-up of weight loss and elevated LFTs --Patient resides in a group home she has cognitive challenges She is on multiple neuropsychiatric medications GI service would like to do endoluminal evaluation colonoscopy plus minus EGD under propofol -Patient unable to safely complete colonoscopy prep at the group home due to cognitive challenges -Admitted 05/31/2019 to complete colonoscopy prep in the hospital. -Dr. fields attempted colonoscopy and flexible sigmoidoscopy on 06/01/2019 but the colon prep was too poor -Awaiting further colon prep and repeat colonoscopy on 06/02/2019 -EGD completed on 2018 2020 with gastritis   A/p 1)H/o Wt Loss--history of anemia, hemoglobin now 12.6, patient is a poor historian -GI consult appreciated p -Dr. fields attempted colonoscopy on 06/01/2019 however, Preparation of the colon was poor with Stool in the rectum, in the recto-sigmoid colon and in the sigmoid colon. --Plan is for colonoscopy 06/02/2019 -EGD findings below reflecting gastritis -continue PPI  2)Schizophrenia/Depression/Dementia-----stable continue Depakote 250 mg p.o. twice daily, Aricept 10 mg daily, Luvox 100 mg nightly,  Namenda 10 mg twice daily, Zyprexa 10 mg daily, phenobarbital 97.2 mg nightly, as needed clonazepam  3)HTN--stable, continue lisinopril 10 mg daily, may use IV labetalol when necessary Every 4 hours for systolic blood pressure over 160 mmhg  4)Elevated LFT--- LFTs have now normalized defer to GI service  5)Gastritis/epigastric pain--EGD with gastritis of the gastric body and gastric atrium--biopsies pending  6)FEN--give dextrose solution IV due to prolonged n.p.o. status  Other---patient has significant cognitive and psychiatric challenges, lives in a group home, has been unable to follow-up with the labs and appointments, unable to complete colonoscopy prep at the group home safely -Plan is for propofol for colonoscopy due to polypharmacy and potential difficulty with sedation -to complete colonoscopy prep and work-up for weight loss  Code Status : Full  Family Communication:    Discussed with [name] [phone])-  Disposition Plan  :  back to group home on 06/02/2019  Procedures:- 06/01/2019 EGD with gastritis The examined esophagus was normal. Localized mild inflammation was found in the gastric body and in the  gastric antrum. Biopsies were taken with a cold forceps for Helicobacter  pylori testing. The examined duodenum was normal.  06/01/2019 -Incomplete colonoscopy due to poor prep plan for repeat colonoscopy 06/02/2019 per GI team  06/02/19 : repeat colonoscopy - One 5 mm polyp in the descending colon, removed                            with a cold snare. Resected and retrieved.                            Redundant colon.                           -  The examination was otherwise normal on direct                            and retroflexion views.  Consults  :  GI Fields/Rourk  DVT Prophylaxis  :   - SCD  Discharge Diagnoses:  Principal Problem:   History of weight loss Active Problems:   Depression   Elevated  liver enzymes   Anemia   Schizophrenia (HCC)   Seizure (HCC)   Discharge Instructions:  Allergies as of 06/02/2019   No Known Allergies     Medication List    TAKE these medications   acetaminophen 500 MG tablet Commonly known as: TYLENOL Take 500 mg by mouth daily as needed for mild pain.   clonazePAM 1 MG tablet Commonly known as: KLONOPIN Take 1 tablet (1 mg total) by mouth 2 (two) times daily as needed (agitation).   divalproex 250 MG DR tablet Commonly known as: DEPAKOTE Take 250 mg by mouth 2 (two) times daily.   donepezil 10 MG tablet Commonly known as: ARICEPT Take 10 mg by mouth daily.   fluvoxaMINE 100 MG tablet Commonly known as: LUVOX Take 100 mg by mouth at bedtime.   lisinopril 10 MG tablet Commonly known as: ZESTRIL Take 10 mg by mouth daily.   memantine 10 MG tablet Commonly known as: NAMENDA Take 10 mg by mouth 2 (two) times daily.   OLANZapine 10 MG tablet Commonly known as: ZYPREXA Take 10 mg by mouth daily.   pantoprazole 40 MG tablet Commonly known as: PROTONIX Take 1 tablet (40 mg total) by mouth daily. Start taking on: June 03, 2019   PHENobarbital 97.2 MG tablet Commonly known as: LUMINAL Take 97.2 mg by mouth at bedtime.      Follow-up Information    Rourk, Gerrit Friendsobert M, MD. Schedule an appointment as soon as possible for a visit in 6 week(s).   Specialty: Gastroenterology Contact information: [address] [place] [address] [phone]        Avon GullyFanta, Tesfaye, MD. Schedule an appointment as soon as possible for a visit in 1 week(s).   Specialty: Internal Medicine Contact information: [address] [place] [address] [phone]          No Known Allergies Allergies as of 06/02/2019   No Known Allergies     Medication List    TAKE these medications   acetaminophen 500 MG tablet Commonly known as: TYLENOL Take 500 mg by mouth daily as needed for mild pain.   clonazePAM 1 MG  tablet Commonly known as: KLONOPIN Take 1 tablet (1 mg total) by mouth 2 (two) times daily as needed (agitation).   divalproex 250 MG DR tablet Commonly known as: DEPAKOTE Take 250 mg by mouth 2 (two) times daily.   donepezil 10 MG tablet Commonly known as: ARICEPT Take 10 mg by mouth daily.   fluvoxaMINE 100 MG tablet Commonly known as: LUVOX Take 100 mg by mouth at bedtime.   lisinopril 10 MG tablet Commonly known as: ZESTRIL Take 10 mg by mouth daily.   memantine 10 MG tablet Commonly known as: NAMENDA Take 10 mg by mouth 2 (two) times daily.   OLANZapine 10 MG tablet Commonly known as: ZYPREXA Take 10 mg by mouth daily.   pantoprazole 40 MG tablet Commonly known as: PROTONIX Take 1 tablet (40 mg total) by mouth daily. Start taking on: June 03, 2019   PHENobarbital 97.2 MG tablet Commonly known  as: LUMINAL Take 97.2 mg by mouth at bedtime.       Procedures/Studies:  Colonoscopy 06/02/19   Impression:       - One 5 mm polyp in the descending colon, removed                            with a cold snare. Resected and retrieved.                            Redundant colon.                           - The examination was otherwise normal on direct                            and retroflexion views. Moderate Sedation:      Moderate (conscious) sedation was administered by the endoscopy nurse       and supervised by the endoscopist. The following parameters were       monitored: oxygen saturation, heart rate, blood pressure, respiratory       rate, EKG, adequacy of pulmonary ventilation, and response to care.       Total physician intraservice time was 26 minutes. Recommendation:           - Patient has a contact number available for                            emergencies. The signs and symptoms of potential                            delayed complications were discussed with the                            patient. Return to normal activities tomorrow.                             Written discharge instructions were provided to the                            patient.                           - Advance diet as tolerated.                           - Continue present medications.                           - Repeat colonoscopy date to be determined after                            pending pathology results are reviewed for                            surveillance based on pathology results.                           -  Return to GI office in 6 weeks. I discussed my                            findings and recommendations with [name].                            From a GI standpoint, patient can leave the                            hospital later today.   Subjective: Pt without complaints, eating and drinking well.  Pt tolerated colonoscopy well. GI said patient could DC back home.   Discharge Exam: Vitals:   06/02/19 1440 06/02/19 1445  BP: 140/77 129/85  Pulse: 98 95  Resp: 20 18  Temp:    SpO2: 100% 100%   Vitals:   06/02/19 1430 06/02/19 1435 06/02/19 1440 06/02/19 1445  BP: (!) 87/49 104/74 140/77 129/85  Pulse: 92 93 98 95  Resp: 18 18 20 18   Temp:      TempSrc:      SpO2: 100% 100% 100% 100%  Weight:      Height:       General: Pt is alert, awake, not in acute distress Cardiovascular: RRR, S1/S2 +, no rubs, no gallops Respiratory: CTA bilaterally, no wheezing, no rhonchi Abdominal: Soft, NT, ND, bowel sounds + Extremities: no edema, no cyanosis   The results of significant diagnostics from this hospitalization (including imaging, microbiology, ancillary and laboratory) are listed below for reference.     Microbiology: Recent Results (from the past 240 hour(s))  SARS CORONAVIRUS 2 (TAT 6-24 HRS)     Status: None   Collection Time: 05/31/19  1:13 PM  Result Value Ref Range Status   SARS Coronavirus 2 NEGATIVE NEGATIVE Final    Comment: (NOTE) SARS-CoV-2 target nucleic acids are NOT DETECTED. The SARS-CoV-2 RNA is  generally detectable in upper and lower respiratory specimens during the acute phase of infection. Negative results do not preclude SARS-CoV-2 infection, do not rule out co-infections with other pathogens, and should not be used as the sole basis for treatment or other patient management decisions. Negative results must be combined with clinical observations, patient history, and epidemiological information. The expected result is Negative. Fact Sheet for Patients: SugarRoll.be Fact Sheet for Healthcare Providers: https://www.woods-mathews.com/ This test is not yet approved or cleared by the [place] FDA and  has been authorized for detection and/or diagnosis of SARS-CoV-2 by FDA under an Emergency Use Authorization (EUA). This EUA will remain  in effect (meaning this test can be used) for the duration of the COVID-19 declaration under Section 56 4(b)(1) of the Act, 21 U.S.C. section 360bbb-3(b)(1), unless the authorization is terminated or revoked sooner. Performed at [facility], [place] [address], [place], [place] [address]      Labs: BNP (last 3 results) Recent Labs    09/23/18 1248  BNP 95.2   Basic Metabolic Panel: Recent Labs  Lab 05/31/19 1335  NA 133*  K 3.7  CL 99  CO2 24  GLUCOSE 106*  BUN 16  CREATININE 0.42*  CALCIUM 9.3   Liver Function Tests: Recent Labs  Lab 05/31/19 1455  AST 21  ALT 17  ALKPHOS 47  BILITOT 0.3  PROT 8.0  ALBUMIN 4.0   No results for input(s): LIPASE, AMYLASE in the last 168  hours. No results for input(s): AMMONIA in the last 168 hours. CBC: Recent Labs  Lab 05/31/19 1335  WBC 4.1  HGB 12.6  HCT 39.4  MCV 95.6  PLT 236   Cardiac Enzymes: No results for input(s): CKTOTAL, CKMB, CKMBINDEX, TROPONINI in the last 168 hours. BNP: Invalid input(s): POCBNP CBG: Recent Labs  Lab 06/02/19 0003 06/02/19 0357 06/02/19 0730 06/02/19 1116 06/02/19 1612  GLUCAP 145*  119* 107* 90 98   D-Dimer No results for input(s): DDIMER in the last 72 hours. Hgb A1c No results for input(s): HGBA1C in the last 72 hours. Lipid Profile No results for input(s): CHOL, HDL, LDLCALC, TRIG, CHOLHDL, LDLDIRECT in the last 72 hours. Thyroid function studies No results for input(s): TSH, T4TOTAL, T3FREE, THYROIDAB in the last 72 hours.  Invalid input(s): FREET3 Anemia work up No results for input(s): VITAMINB12, FOLATE, FERRITIN, TIBC, IRON, RETICCTPCT in the last 72 hours. Urinalysis No results found for: COLORURINE, APPEARANCEUR, LABSPEC, PHURINE, GLUCOSEU, HGBUR, BILIRUBINUR, KETONESUR, PROTEINUR, UROBILINOGEN, NITRITE, LEUKOCYTESUR Sepsis Labs Invalid input(s): PROCALCITONIN,  WBC,  LACTICIDVEN Microbiology Recent Results (from the past 240 hour(s))  SARS CORONAVIRUS 2 (TAT 6-24 HRS)     Status: None   Collection Time: 05/31/19  1:13 PM  Result Value Ref Range Status   SARS Coronavirus 2 NEGATIVE NEGATIVE Final    Comment: (NOTE) SARS-CoV-2 target nucleic acids are NOT DETECTED. The SARS-CoV-2 RNA is generally detectable in upper and lower respiratory specimens during the acute phase of infection. Negative results do not preclude SARS-CoV-2 infection, do not rule out co-infections with other pathogens, and should not be used as the sole basis for treatment or other patient management decisions. Negative results must be combined with clinical observations, patient history, and epidemiological information. The expected result is Negative. Fact Sheet for Patients: HairSlick.no Fact Sheet for Healthcare Providers: quierodirigir.com This test is not yet approved or cleared by the [place] FDA and  has been authorized for detection and/or diagnosis of SARS-CoV-2 by FDA under an Emergency Use Authorization (EUA). This EUA will remain  in effect (meaning this test can be used) for the duration of  the COVID-19 declaration under Section 56 4(b)(1) of the Act, 21 U.S.C. section 360bbb-3(b)(1), unless the authorization is terminated or revoked sooner. Performed at [facility], [address], [place], [place] [address]    Time coordinating discharge:   SIGNED:  Standley Dakins, MD  [facility] 06/02/2019, 4:56 PM How to contact the [facility] Attending or Consulting provider 7A - 7P or covering provider during after hours 7P -7A, for this patient?  1. Check the care team in [facility] and look for a) attending/consulting [facility] provider listed and b) the [facility] team listed 2. Log into [email or web address] and use [facility] universal password to access. If you do not have the password, please contact the hospital operator. 3. Locate the [facility] provider you are looking for under [facility] and page to a number that you can be directly reached. 4. If you still have difficulty reaching the provider, please page the [facility] (Director on Call) for the Hospitalists listed on amion for assistance.

## 2019-06-02 NOTE — Progress Notes (Signed)
Patient has completed additional 4L golytely. Stools are now liquid, yellow, with no particles. Discussed with Dr. Gala Romney. We will proceed with TCS this afternoon as scheduled. Updated nursing staff. NPO at this time.

## 2019-06-02 NOTE — Op Note (Signed)
[facility] Patient Name: Stephanie Vincent Procedure Date: 06/02/2019 2:09 PM MRN: [id number] Date of Birth: Oct 17, 1955 Attending MD: Norvel Richards , MD CSN: [id number] Age: 63 Admit Type: Inpatient Procedure:                Colonoscopy Indications:              Screening for colorectal malignant neoplasm Providers:                Norvel Richards, MD, Jeanann Lewandowsky. Gwenlyn Perking RN, RN,                            Randa Spike, Technician Referring MD:              Medicines:                Midazolam 4 mg IV, Meperidine 50 mg IV, Ondansetron                            4 mg IV Complications:            No immediate complications. Estimated Blood Loss:     Estimated blood loss was minimal. Procedure:                Pre-Anesthesia Assessment:                           - Prior to the procedure, a History and Physical                            was performed, and patient medications and                            allergies were reviewed. The patient's tolerance of                            previous anesthesia was also reviewed. The risks                            and benefits of the procedure and the sedation                            options and risks were discussed with the patient.                            All questions were answered, and informed consent                            was obtained. Prior Anticoagulants: The patient has                            taken no previous anticoagulant or antiplatelet                            agents. ASA Grade Assessment: III - A patient with  severe systemic disease. After reviewing the risks                            and benefits, the patient was deemed in                            satisfactory condition to undergo the procedure.                           After obtaining informed consent, the colonoscope                            was passed under direct vision. Throughout the   procedure, the patient's blood pressure, pulse, and                            oxygen saturations were monitored continuously. The                            [id number]-[id number] ([id number] scope was introduced through                            the and advanced to the the cecum, identified by                            appendiceal orifice and ileocecal valve. Scope In: 2:21:29 PM Scope Out: 2:41:19 PM Scope Withdrawal Time: 0 hours 14 minutes 3 seconds  Total Procedure Duration: 0 hours 19 minutes 50 seconds  Findings:      The perianal and digital rectal examinations were normal. Markedly       redundant colon. External abdominal pressure needed at times to advance       to the cecum.      A 5 mm polyp was found in the descending colon. The polyp was sessile.       The polyp was removed with a cold snare. Resection and retrieval were       complete. Estimated blood loss was minimal.      The exam was otherwise without abnormality on direct and retroflexion       views. Impression:               - One 5 mm polyp in the descending colon, removed                            with a cold snare. Resected and retrieved.                            Redundant colon.                           - The examination was otherwise normal on direct                            and retroflexion views. Moderate Sedation:      Moderate (conscious) sedation was administered by the endoscopy nurse       and supervised by the  endoscopist. The following parameters were       monitored: oxygen saturation, heart rate, blood pressure, respiratory       rate, EKG, adequacy of pulmonary ventilation, and response to care.       Total physician intraservice time was 26 minutes. Recommendation:           - Patient has a contact number available for                            emergencies. The signs and symptoms of potential                            delayed complications were discussed with the                            patient.  Return to normal activities tomorrow.                            Written discharge instructions were provided to the                            patient.                           - Advance diet as tolerated.                           - Continue present medications.                           - Repeat colonoscopy date to be determined after                            pending pathology results are reviewed for                            surveillance based on pathology results.                           - Return to GI office in 6 weeks. I discussed my                            findings and recommendations with Stephanie Vincent.                            From a GI standpoint, patient can leave the                            hospital later today. Procedure Code(s):        --- Professional ---                           [phone], Moderate sedation services provided by the                            same physician or  other qualified health care                            professional performing the diagnostic or                            therapeutic service that the sedation supports,                            requiring the presence of an independent trained                            observer to assist in the monitoring of the                            patient's level of consciousness and physiological                            status; initial 15 minutes of intraservice time,                            patient age 48 years or older                           [phone], Moderate sedation; each additional 15                            minutes intraservice time Diagnosis Code(s):        --- Professional ---                           Z12.11, Encounter for screening for malignant                            neoplasm of colon                           K63.5, Polyp of colon CPT copyright 2019 American Medical Association. All rights reserved. The codes documented in this report are preliminary and upon coder  review may  be revised to meet current compliance requirements. Gerrit Friends. [name], MD Gennette Pac, MD 06/02/2019 2:53:15 PM This report has been signed electronically. Number of Addenda: 0

## 2019-06-03 NOTE — Care Management (Signed)
06/03/19 5:30 pm Patient discharging back to [facility]. FL2 completed and printed along with DC summary to be sent back to [facility] with patient. Representative from [facility] here to transport patient back. Discussed return with [facility].

## 2019-06-04 ENCOUNTER — Telehealth: Payer: Self-pay | Admitting: Gastroenterology

## 2019-06-04 LAB — SURGICAL PATHOLOGY

## 2019-06-04 MED ORDER — AMOXICILLIN 500 MG PO TABS: ORAL_TABLET | ORAL | 0 refills | Status: DC

## 2019-06-04 MED ORDER — PANTOPRAZOLE SODIUM 40 MG PO TBEC: DELAYED_RELEASE_TABLET | ORAL | 11 refills | Status: DC

## 2019-06-04 MED ORDER — CLARITHROMYCIN 500 MG PO TABS: ORAL_TABLET | ORAL | 0 refills | Status: DC

## 2019-06-04 NOTE — Telephone Encounter (Signed)
PLEASE CALL PT. Her stomach Bx showed H. Pylori infection. She needs AMOXICILLIN 500 mg 2 po BID for 10 days and Biaxin 500 mg po bid for 10 days. She needs PROTONIX BID for 10 days then 1 po DAILY FOREVER. Med side effects include NAUSEA, VOMITING, DIARRHEA, ABDOMINAL pain, and metallic taste.  OPV IN 6 MOS NOT 6 WEEKS, Dx: H PYLORI GASTRITIS.

## 2019-06-06 ENCOUNTER — Encounter: Payer: Self-pay | Admitting: Internal Medicine

## 2019-06-07 ENCOUNTER — Encounter: Payer: Self-pay | Admitting: Gastroenterology

## 2019-06-07 ENCOUNTER — Encounter (HOSPITAL_COMMUNITY): Payer: Self-pay | Admitting: Gastroenterology

## 2019-06-07 NOTE — Telephone Encounter (Signed)
PT's caregiver, Scientist, physiological of the [facility], Rexanne Mano was informed of results and plan. They have already picked up the medications.

## 2019-06-07 NOTE — Telephone Encounter (Signed)
PATIENT SCHEDULED AND LETTER SENT  °

## 2019-07-20 ENCOUNTER — Other Ambulatory Visit (HOSPITAL_COMMUNITY): Payer: Self-pay | Admitting: Internal Medicine

## 2019-07-20 DIAGNOSIS — Z1231 Encounter for screening mammogram for malignant neoplasm of breast: Secondary | ICD-10-CM

## 2019-08-16 ENCOUNTER — Ambulatory Visit (HOSPITAL_COMMUNITY): Payer: Medicare Other

## 2019-08-25 ENCOUNTER — Ambulatory Visit (HOSPITAL_COMMUNITY)
Admission: RE | Admit: 2019-08-25 | Discharge: 2019-08-25 | Disposition: A | Discharge status: Home / Self Care | Payer: Medicare Other | Source: Ambulatory Visit | Attending: Internal Medicine | Admitting: Internal Medicine

## 2019-08-25 ENCOUNTER — Other Ambulatory Visit: Payer: Self-pay

## 2019-08-25 DIAGNOSIS — Z1231 Encounter for screening mammogram for malignant neoplasm of breast: Secondary | ICD-10-CM | POA: Diagnosis not present

## 2019-09-02 DIAGNOSIS — I10 Essential (primary) hypertension: Secondary | ICD-10-CM | POA: Diagnosis not present

## 2019-09-02 DIAGNOSIS — F209 Schizophrenia, unspecified: Secondary | ICD-10-CM | POA: Diagnosis not present

## 2019-09-27 ENCOUNTER — Encounter: Payer: Self-pay | Admitting: Gastroenterology

## 2019-10-03 DIAGNOSIS — I10 Essential (primary) hypertension: Secondary | ICD-10-CM | POA: Diagnosis not present

## 2019-10-03 DIAGNOSIS — F209 Schizophrenia, unspecified: Secondary | ICD-10-CM | POA: Diagnosis not present

## 2019-10-19 ENCOUNTER — Other Ambulatory Visit: Payer: Self-pay

## 2019-10-19 ENCOUNTER — Ambulatory Visit: Payer: Medicare Other | Attending: Internal Medicine

## 2019-10-19 DIAGNOSIS — Z23 Encounter for immunization: Secondary | ICD-10-CM

## 2019-10-19 NOTE — Progress Notes (Signed)
   Covid-19 Vaccination Clinic  Name:  Stephanie Vincent    MRN: [id number] DOB: [birth year]  10/19/2019  [name] was observed post Covid-19 immunization for 15 minutes   without incident. She was provided with Vaccine Information Sheet and instruction to access the V-Safe system.   [name] was instructed to call 911 with any severe reactions post vaccine: Marland Kitchen Difficulty breathing  . Swelling of face and throat  . A fast heartbeat  . A bad rash all over body  . Dizziness and weakness   Immunizations Administered    Name Date Dose VIS Date Route   Moderna COVID-19 Vaccine 10/19/2019  2:31 PM 0.5 mL 07/20/2019 Intramuscular   Manufacturer: Moderna   Lot: [id number]   NDC: 92924-462-86

## 2019-10-31 DIAGNOSIS — F209 Schizophrenia, unspecified: Secondary | ICD-10-CM | POA: Diagnosis not present

## 2019-10-31 DIAGNOSIS — I10 Essential (primary) hypertension: Secondary | ICD-10-CM | POA: Diagnosis not present

## 2019-11-16 ENCOUNTER — Ambulatory Visit: Payer: Medicare Other | Attending: Internal Medicine

## 2019-11-16 DIAGNOSIS — Z23 Encounter for immunization: Secondary | ICD-10-CM

## 2019-11-16 NOTE — Progress Notes (Signed)
   Covid-19 Vaccination Clinic  Name:  Stephanie Vincent    MRN: [id number] DOB: [birth year]  11/16/2019  Ms. [name] was observed post Covid-19 immunization for 15 minutes without incident. She was provided with Vaccine Information Sheet and instruction to access the V-Safe system.   Ms. [name] was instructed to call 911 with any severe reactions post vaccine: Marland Kitchen Difficulty breathing  . Swelling of face and throat  . A fast heartbeat  . A bad rash all over body  . Dizziness and weakness   Immunizations Administered    Name Date Dose VIS Date Route   Moderna COVID-19 Vaccine 11/16/2019  1:17 PM 0.5 mL 07/20/2019 Intramuscular   Manufacturer: Moderna   Lot: [id number]   NDC: 49355-217-47

## 2019-12-01 DIAGNOSIS — F0390 Unspecified dementia without behavioral disturbance: Secondary | ICD-10-CM | POA: Diagnosis not present

## 2019-12-01 DIAGNOSIS — I10 Essential (primary) hypertension: Secondary | ICD-10-CM | POA: Diagnosis not present

## 2019-12-07 ENCOUNTER — Ambulatory Visit: Payer: Medicare Other | Admitting: Gastroenterology

## 2019-12-08 ENCOUNTER — Ambulatory Visit: Payer: Medicare Other | Admitting: Gastroenterology

## 2019-12-17 DIAGNOSIS — I10 Essential (primary) hypertension: Secondary | ICD-10-CM | POA: Diagnosis not present

## 2019-12-17 DIAGNOSIS — Z0001 Encounter for general adult medical examination with abnormal findings: Secondary | ICD-10-CM | POA: Diagnosis not present

## 2019-12-27 DIAGNOSIS — G40909 Epilepsy, unspecified, not intractable, without status epilepticus: Secondary | ICD-10-CM | POA: Diagnosis not present

## 2019-12-27 DIAGNOSIS — I10 Essential (primary) hypertension: Secondary | ICD-10-CM | POA: Diagnosis not present

## 2019-12-27 DIAGNOSIS — F209 Schizophrenia, unspecified: Secondary | ICD-10-CM | POA: Diagnosis not present

## 2019-12-27 DIAGNOSIS — F339 Major depressive disorder, recurrent, unspecified: Secondary | ICD-10-CM | POA: Diagnosis not present

## 2020-01-11 ENCOUNTER — Encounter: Payer: Self-pay | Admitting: Gastroenterology

## 2020-01-11 ENCOUNTER — Ambulatory Visit: Payer: Medicare Other | Admitting: Gastroenterology

## 2020-01-11 NOTE — Progress Notes (Deleted)
Colonoscopy Oct 2020: one 5 mm polyp in descending colon s/p removal, redundant colon. EGD with mild gastritis +H.pylori

## 2020-01-27 DIAGNOSIS — I10 Essential (primary) hypertension: Secondary | ICD-10-CM | POA: Diagnosis not present

## 2020-01-27 DIAGNOSIS — F209 Schizophrenia, unspecified: Secondary | ICD-10-CM | POA: Diagnosis not present

## 2020-02-17 ENCOUNTER — Encounter: Payer: Self-pay | Admitting: Gastroenterology

## 2020-02-17 NOTE — Progress Notes (Signed)
Referring Provider: Rosita Fire, MD Primary Care Physician:  Rosita Fire, MD Primary GI Physician: Dr. Oneida Alar historically; will establish with Dr. Abbey Chatters (Dr. Gala Romney following for now)  Chief Complaint  Patient presents with   Other    Post procedure follow-up    HPI:   Stephanie Vincent is a 64 y.o. female who resides at a family care home with history of weight loss, elevated LFTs, and anemia presenting today for follow-up s/p TCS/EGD with diagnosis of 1 tubular adenoma and H. pylori gastritis.  Patient seen via virtual visit 11/23/2018 at the request of Dr. Legrand Rams due to weight loss and request for colonoscopy.  Weight loss seem to improve after adding Ensure.  She was due for colonoscopy.  No concerning upper or lower GI symptoms.  Reviewed recent labs from February 2020 with hemoglobin 11.1, AST 80, ALT 52, previously LFTs normal in October 2019.  Mild normocytic anemia with hemoglobin 11.5 in October 2019.  Labs ordered and she was scheduled for colonoscopy +/-EGD if evidence of IDA.  Labs: Never completed outpatient.  Patient was admitted to the hospital for colon prep assistance. Labs during hospitalization with hemoglobin 12.6, AST 21, ALT 17, alk phos 47, total bilirubin 0.3.  Procedures 06/01/2019: EGD: Performed due to vague upper abdominal pain.  Normal esophagus, gastritis s/p biopsy, normal duodenum.  Pathology positive for H. Pylori.  Treated with amoxicillin, Biaxin, and Protonix.  Recommended continuing Protonix daily forever after H. pylori treatment. Colonoscopy: Incomplete due to poor preparation. Colonoscopy 06/02/2019: Redundant colon, 5 mm polyp in the descending colon, otherwise normal exam.  Pathology with tubular adenoma.  Recommended repeat exam in 5 years.  Today:  Complete antibiotics. No complaints. Limited HPI as patient gives minimal response and answers yes/no primarily. Little dialogue. Presents with care taker who reports no known GI issues.  Patient denies abdominal pain, GERD symptoms, dysphagia, nausea, or vomiting. BMs are daily. No constipation or diarrhea. No blood in the stool. No black stool. Eating well. Weight is up 24 lbs in the last 9 months.   Past Medical History:  Diagnosis Date   H. pylori infection 06/01/2019   Treated with amoxicillin, Biaxin, Protonix   HTN (hypertension)    Manic depression ([place])    Schizophrenia ([place])    Seizures ([place])     Past Surgical History:  Procedure Laterality Date   BIOPSY  06/01/2019   Procedure: BIOPSY;  Surgeon: Danie Binder, MD;  [facility];  Service: Endoscopy;;  gastric   COLONOSCOPY  02/2009   Dr. Oneida Alar: slightly torturous colon otherwise normal    COLONOSCOPY N/A 06/02/2019   Procedure: COLONOSCOPY;  Surgeon: Daneil Dolin, MD; redundant colon, 5 mm polyp in the descending colon, otherwise normal exam.  Path with tubular adenoma.  Recommended repeat in 5 years.   COLONOSCOPY WITH PROPOFOL N/A 06/01/2019   Procedure: COLONOSCOPY WITH PROPOFOL;  Surgeon: Danie Binder, MD; incomplete due to poor prep   ESOPHAGOGASTRODUODENOSCOPY (EGD) WITH PROPOFOL N/A 06/01/2019   Procedure: ESOPHAGOGASTRODUODENOSCOPY (EGD) WITH PROPOFOL;  Surgeon: Danie Binder, MD;  Normal esophagus, gastritis s/p biopsy, normal duodenum.  Pathology positive for H. Pylori.  Treated with amoxicillin, Biaxin, and Protonix.    POLYPECTOMY  06/02/2019   Procedure: POLYPECTOMY;  Surgeon: Daneil Dolin, MD;  [facility];  Service: Endoscopy;;  colon    Current Outpatient Medications  Medication Sig Dispense Refill   acetaminophen (TYLENOL) 500 MG tablet Take 500 mg by mouth  daily as needed for mild pain.     clonazePAM (KLONOPIN) 1 MG tablet Take 1 tablet (1 mg total) by mouth 2 (two) times daily as needed (agitation). 10 tablet 0   divalproex (DEPAKOTE) 250 MG DR tablet Take 250 mg by mouth 2 (two) times daily.      donepezil (ARICEPT) 10 MG tablet Take  10 mg by mouth daily.      fluvoxaMINE (LUVOX) 100 MG tablet Take 100 mg by mouth at bedtime.      lisinopril (PRINIVIL,ZESTRIL) 10 MG tablet Take 10 mg by mouth daily.      memantine (NAMENDA) 10 MG tablet Take 10 mg by mouth 2 (two) times daily.      OLANZapine (ZYPREXA) 10 MG tablet Take 10 mg by mouth daily.     pantoprazole (PROTONIX) 40 MG tablet 1 PO BID FOR 10 DAYS THEN ONCE DAILY FOREVER 30 tablet 11   PHENobarbital (LUMINAL) 97.2 MG tablet Take 97.2 mg by mouth at bedtime.      No current facility-administered medications for this visit.    Allergies as of 02/18/2020   (No Known Allergies)    Family History  Problem Relation Age of Onset   Colon cancer Neg Hx    Colon polyps Neg Hx     Social History   Socioeconomic History   Marital status: Single    Spouse name: Not on file   Number of children: Not on file   Years of education: Not on file   Highest education level: Not on file  Occupational History   Not on file  Tobacco Use   Smoking status: Never Smoker   Smokeless tobacco: Never Used  Vaping Use   Vaping Use: Never used  Substance and Sexual Activity   Alcohol use: No   Drug use: No   Sexual activity: Not Currently  Other Topics Concern   Not on file  Social History Narrative   Not on file   Social Determinants of Health   Financial Resource Strain: Low Risk    Difficulty of Paying Living Expenses: Not hard at all  Food Insecurity: No Food Insecurity   Worried About Charity fundraiser in the Last Year: Never true   Ran Out of Food in the Last Year: Never true  Transportation Needs: No Transportation Needs   Lack of Transportation (Medical): No   Lack of Transportation (Non-Medical): No  Physical Activity: Inactive   Days of Exercise per Week: 0 days   Minutes of Exercise per Session: 0 min  Stress: No Stress Concern Present   Feeling of Stress : Not at all  Social Connections: Socially Isolated   Frequency  of Communication with Friends and Family: Never   Frequency of Social Gatherings with Friends and Family: Never   Attends Religious Services: Never   Marine scientist or Organizations: No   Attends Music therapist: Never   Marital Status: Never married    Review of Systems: Gen: Denies fever, chills, presyncope or syncope.  CV: Denies chest pain or palpitations. Resp: Denies dyspnea or cough. GI: See HPI  Heme: See HPI  Physical Exam: BP 137/85    Pulse 66    Temp (!) 96.6 F (35.9 C) (Oral)    Ht 5' 2" (1.575 m)    Wt 147 lb 9.6 oz (67 kg)    BMI 27.00 kg/m  General:   Alert. No distress noted. Cooperative.  Head:  Normocephalic and atraumatic. Eyes:  Conjuctiva clear without scleral icterus.Marland Kitchen Heart:  S1, S2 present without murmurs appreciated. Lungs:  Clear to auscultation bilaterally. No wheezes, rales, or rhonchi. No distress.  Abdomen:  +BS, soft, non-tender and non-distended. No rebound or guarding. No HSM or masses noted. Msk:  Symmetrical without gross deformities. Normal posture. Extremities:  Without edema. Neurologic:  Alert and  oriented x3 Psych:  Flat affect.

## 2020-02-18 ENCOUNTER — Ambulatory Visit (INDEPENDENT_AMBULATORY_CARE_PROVIDER_SITE_OTHER): Payer: Medicare Other | Admitting: Gastroenterology

## 2020-02-18 ENCOUNTER — Other Ambulatory Visit: Payer: Self-pay

## 2020-02-18 ENCOUNTER — Encounter: Payer: Self-pay | Admitting: Gastroenterology

## 2020-02-18 VITALS — BP 137/85 | HR 66 | Temp 96.6°F | Ht 62.0 in | Wt 147.6 lb

## 2020-02-18 DIAGNOSIS — Z87898 Personal history of other specified conditions: Secondary | ICD-10-CM | POA: Diagnosis not present

## 2020-02-18 DIAGNOSIS — K2970 Gastritis, unspecified, without bleeding: Secondary | ICD-10-CM

## 2020-02-18 DIAGNOSIS — B9681 Helicobacter pylori [H. pylori] as the cause of diseases classified elsewhere: Secondary | ICD-10-CM | POA: Diagnosis not present

## 2020-02-18 NOTE — Assessment & Plan Note (Addendum)
H. pylori gastritis diagnosed at the time of EGD on 06/01/2019.  Patient was treated with amoxicillin, Biaxin, and Protonix.  She has continued on Protonix once daily.  No upper GI symptoms at this time.  She had reported vague upper abdominal pain prior to EGD which has resolved.  Denies BRBPR or melena.   Hold Protonix x14 days then complete H. pylori breath test. After completing H. pylori breath test, resume Protonix 40 mg daily. Follow-up as needed.

## 2020-02-18 NOTE — Assessment & Plan Note (Signed)
Resolved.  At patient's virtual visit in April 2020, referral reported weight loss which reportedly had improved with Ensure.  Patient has documented weight gain of 24 pounds in the last 9 months.  She underwent colonoscopy which revealed one tubular adenoma due for repeat in 5 years and EGD revealing H. pylori gastritis which has been treated.  Currently, she is eating well with no significant upper or lower GI symptoms.  We will plan to follow-up as needed. See H. pylori gastritis above for plans to evaluate for eradication. NIC for TCS in October 2025.

## 2020-02-18 NOTE — Patient Instructions (Addendum)
Hold Protonix for 14 days then complete H. pylori breath test at [facility] lab.  You may resume Protonix after you complete the breath test.  We will call you with results.  Otherwise, we will follow up with you as needed.  Do not hesitate to call with any questions or concerns prior.  Your next colonoscopy is due in October 2025.  Ermalinda Memos, PA-C [facility]

## 2020-03-10 IMAGING — CR DG CHEST 2V
2 series · 2 of 2 positions shown · non-contrast
Comparison: None.

CLINICAL DATA: Arthritis.  Altered mental status.

EXAM:
CHEST - 2 VIEW

[w chest lat]
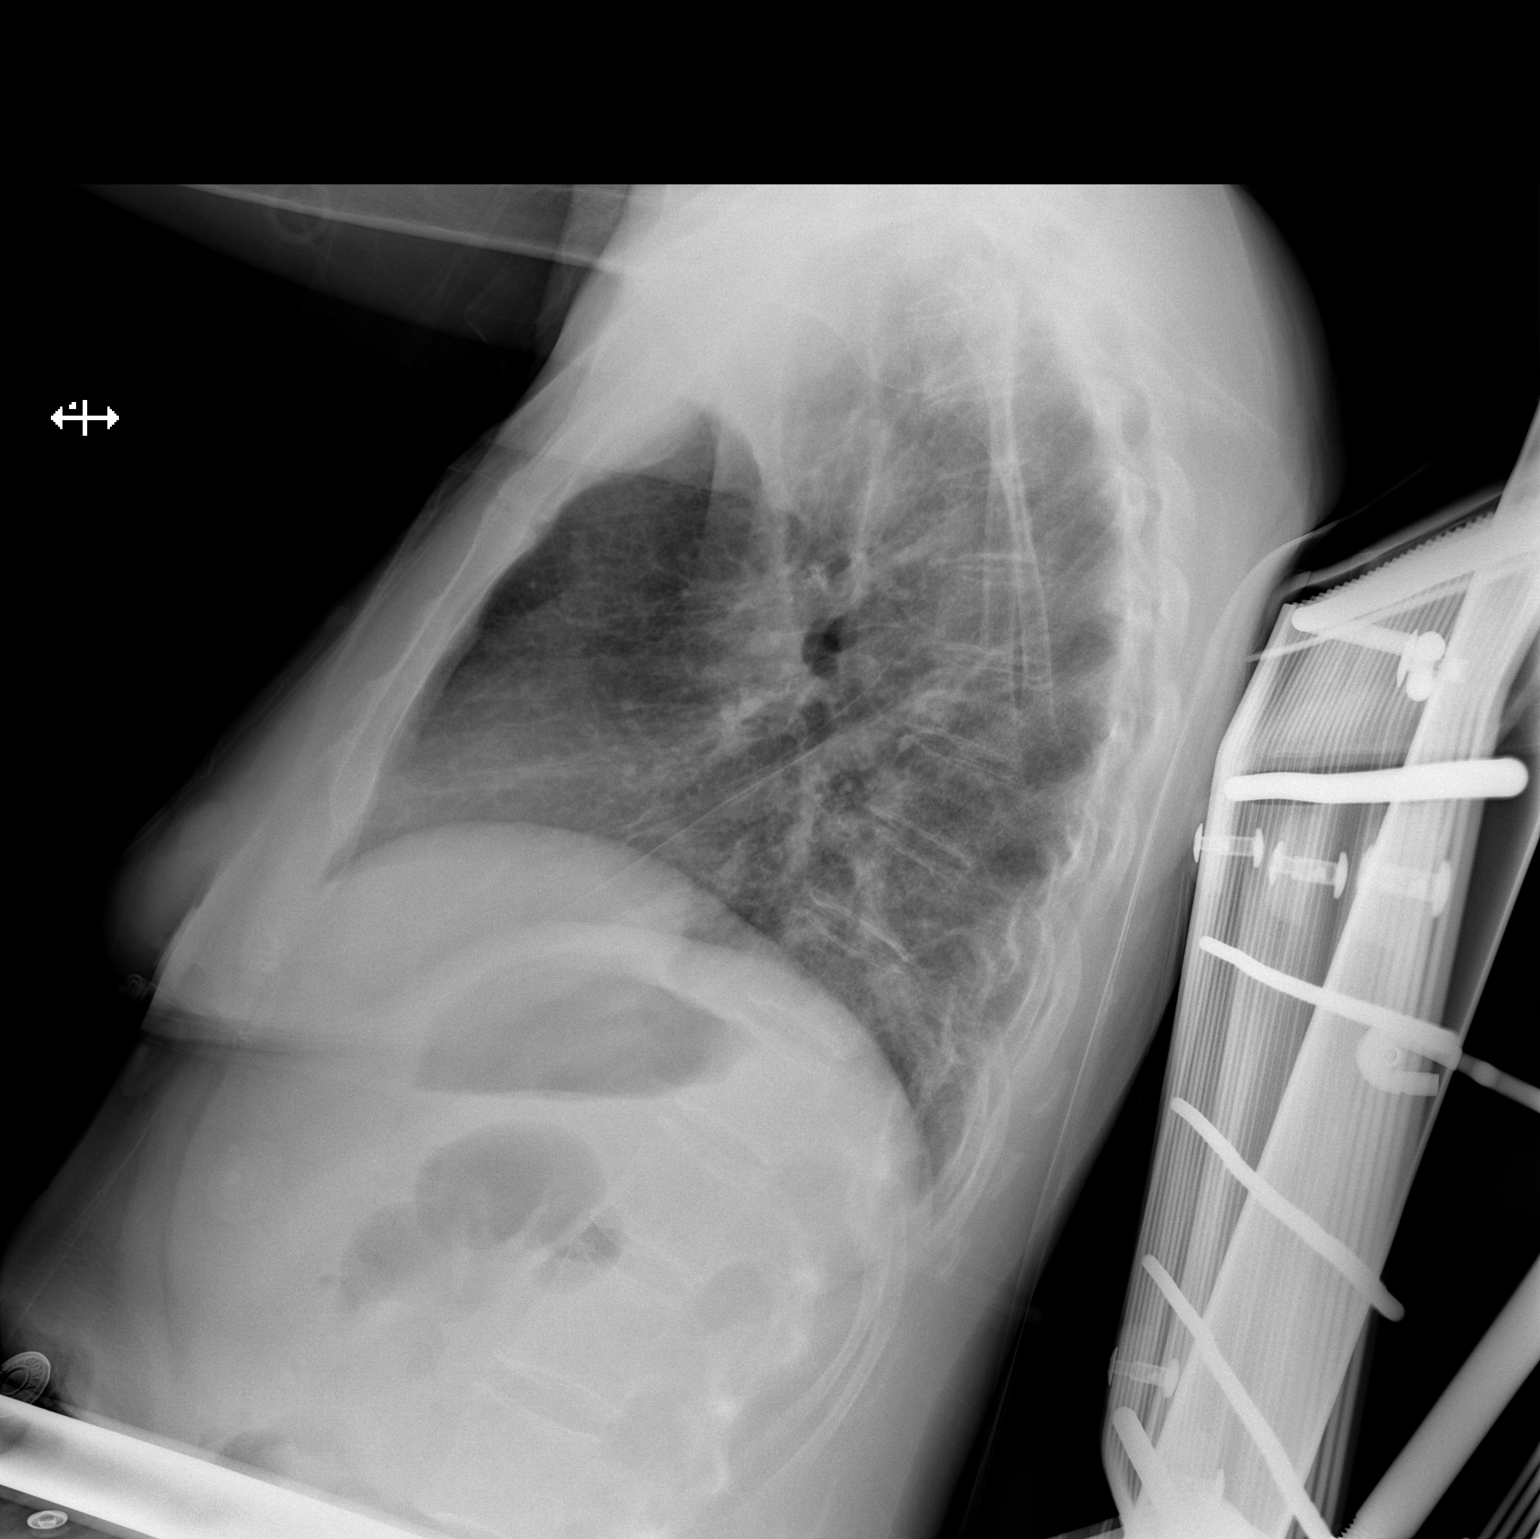

[x chest ap]
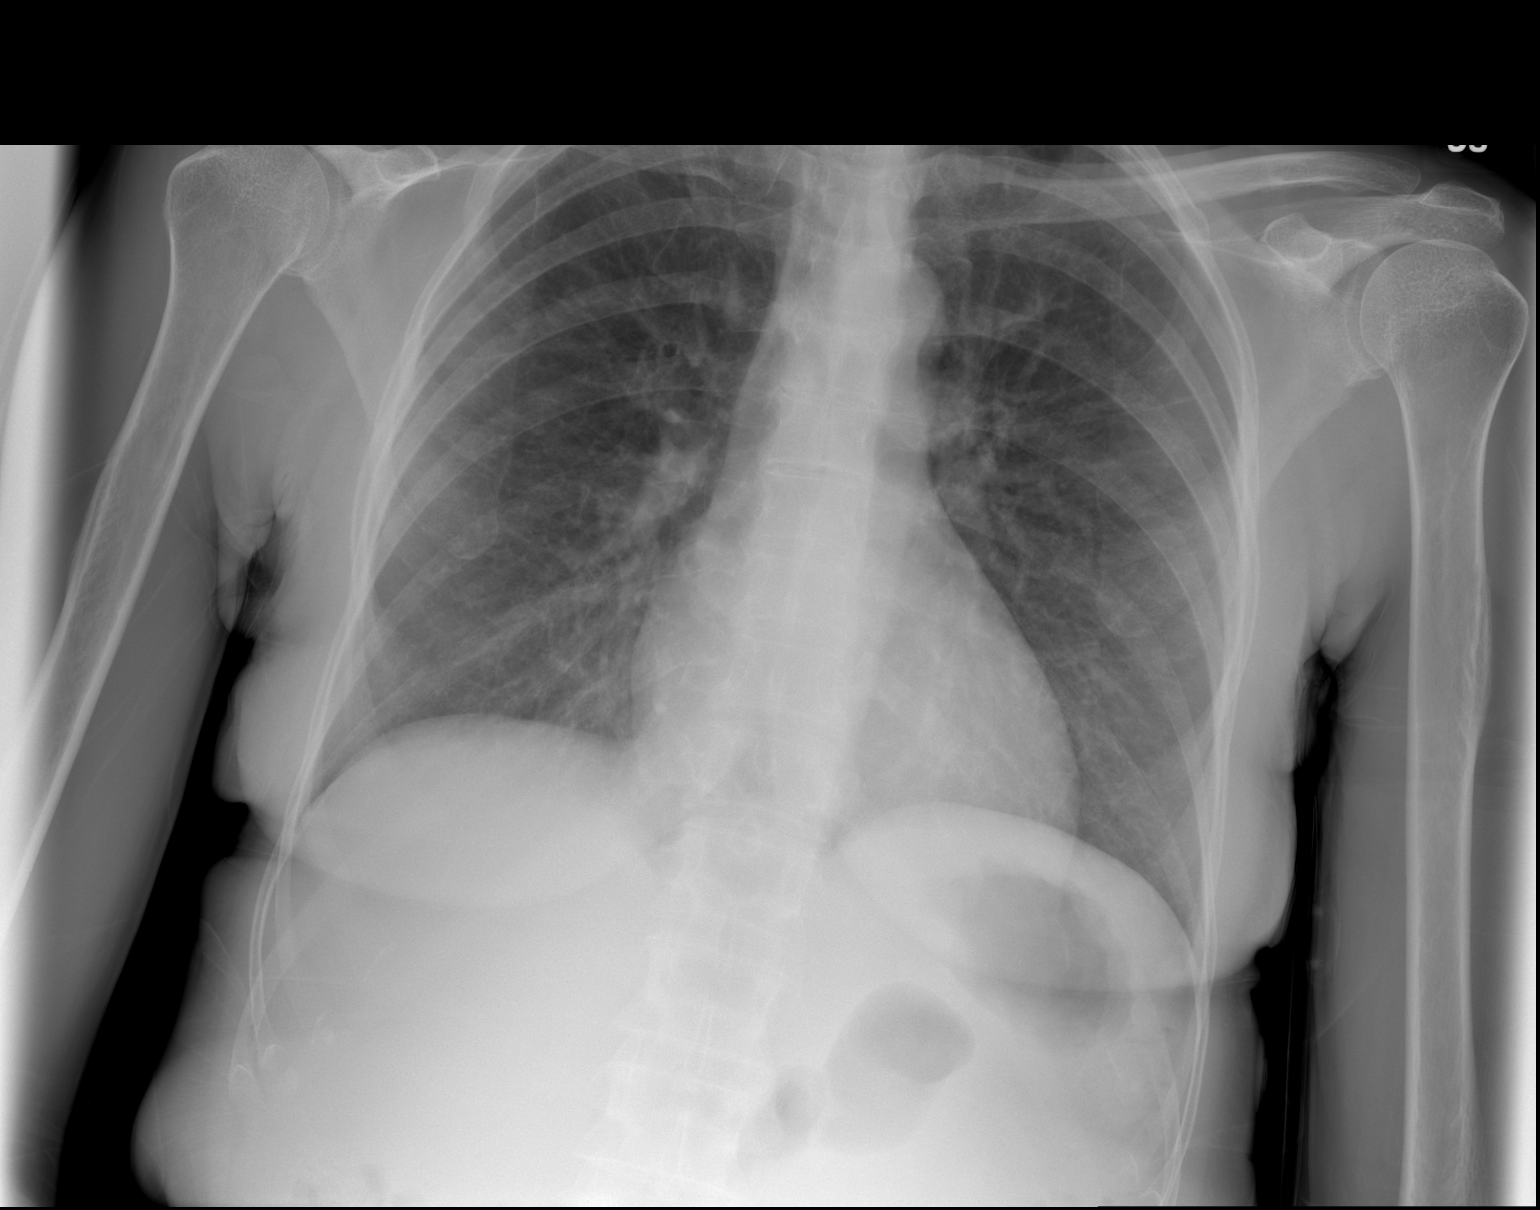

[2 of 2 positions shown; findings below may reference images not displayed]

FINDINGS: The heart size and mediastinal contours are within normal limits.
Both lungs are clear. The visualized skeletal structures are
unremarkable.
IMPRESSION: No active cardiopulmonary disease.

## 2020-03-28 DIAGNOSIS — I10 Essential (primary) hypertension: Secondary | ICD-10-CM | POA: Diagnosis not present

## 2020-03-28 DIAGNOSIS — F209 Schizophrenia, unspecified: Secondary | ICD-10-CM | POA: Diagnosis not present

## 2020-04-21 DIAGNOSIS — I10 Essential (primary) hypertension: Secondary | ICD-10-CM | POA: Diagnosis not present

## 2020-04-21 DIAGNOSIS — F339 Major depressive disorder, recurrent, unspecified: Secondary | ICD-10-CM | POA: Diagnosis not present

## 2020-04-21 DIAGNOSIS — F209 Schizophrenia, unspecified: Secondary | ICD-10-CM | POA: Diagnosis not present

## 2020-04-21 DIAGNOSIS — G40909 Epilepsy, unspecified, not intractable, without status epilepticus: Secondary | ICD-10-CM | POA: Diagnosis not present

## 2020-04-27 DIAGNOSIS — U071 COVID-19: Secondary | ICD-10-CM | POA: Diagnosis not present

## 2020-04-27 DIAGNOSIS — Z20828 Contact with and (suspected) exposure to other viral communicable diseases: Secondary | ICD-10-CM | POA: Diagnosis not present

## 2020-05-01 DIAGNOSIS — U071 COVID-19: Secondary | ICD-10-CM | POA: Diagnosis not present

## 2020-05-01 DIAGNOSIS — Z20828 Contact with and (suspected) exposure to other viral communicable diseases: Secondary | ICD-10-CM | POA: Diagnosis not present

## 2020-05-02 DIAGNOSIS — M6281 Muscle weakness (generalized): Secondary | ICD-10-CM | POA: Diagnosis not present

## 2020-05-02 DIAGNOSIS — M545 Low back pain: Secondary | ICD-10-CM | POA: Diagnosis not present

## 2020-05-02 DIAGNOSIS — R2681 Unsteadiness on feet: Secondary | ICD-10-CM | POA: Diagnosis not present

## 2020-05-03 DIAGNOSIS — R278 Other lack of coordination: Secondary | ICD-10-CM | POA: Diagnosis not present

## 2020-05-03 DIAGNOSIS — F0390 Unspecified dementia without behavioral disturbance: Secondary | ICD-10-CM | POA: Diagnosis not present

## 2020-05-03 DIAGNOSIS — F209 Schizophrenia, unspecified: Secondary | ICD-10-CM | POA: Diagnosis not present

## 2020-05-04 DIAGNOSIS — M545 Low back pain: Secondary | ICD-10-CM | POA: Diagnosis not present

## 2020-05-04 DIAGNOSIS — Z20828 Contact with and (suspected) exposure to other viral communicable diseases: Secondary | ICD-10-CM | POA: Diagnosis not present

## 2020-05-04 DIAGNOSIS — R2681 Unsteadiness on feet: Secondary | ICD-10-CM | POA: Diagnosis not present

## 2020-05-04 DIAGNOSIS — M6281 Muscle weakness (generalized): Secondary | ICD-10-CM | POA: Diagnosis not present

## 2020-05-04 DIAGNOSIS — U071 COVID-19: Secondary | ICD-10-CM | POA: Diagnosis not present

## 2020-05-08 DIAGNOSIS — F209 Schizophrenia, unspecified: Secondary | ICD-10-CM | POA: Diagnosis not present

## 2020-05-08 DIAGNOSIS — R278 Other lack of coordination: Secondary | ICD-10-CM | POA: Diagnosis not present

## 2020-05-08 DIAGNOSIS — F0390 Unspecified dementia without behavioral disturbance: Secondary | ICD-10-CM | POA: Diagnosis not present

## 2020-05-09 DIAGNOSIS — M6281 Muscle weakness (generalized): Secondary | ICD-10-CM | POA: Diagnosis not present

## 2020-05-09 DIAGNOSIS — R2681 Unsteadiness on feet: Secondary | ICD-10-CM | POA: Diagnosis not present

## 2020-05-09 DIAGNOSIS — M545 Low back pain: Secondary | ICD-10-CM | POA: Diagnosis not present

## 2020-05-09 DIAGNOSIS — Z20828 Contact with and (suspected) exposure to other viral communicable diseases: Secondary | ICD-10-CM | POA: Diagnosis not present

## 2020-05-09 DIAGNOSIS — U071 COVID-19: Secondary | ICD-10-CM | POA: Diagnosis not present

## 2020-05-11 DIAGNOSIS — M545 Low back pain: Secondary | ICD-10-CM | POA: Diagnosis not present

## 2020-05-11 DIAGNOSIS — R2681 Unsteadiness on feet: Secondary | ICD-10-CM | POA: Diagnosis not present

## 2020-05-11 DIAGNOSIS — U071 COVID-19: Secondary | ICD-10-CM | POA: Diagnosis not present

## 2020-05-11 DIAGNOSIS — M6281 Muscle weakness (generalized): Secondary | ICD-10-CM | POA: Diagnosis not present

## 2020-05-11 DIAGNOSIS — Z20828 Contact with and (suspected) exposure to other viral communicable diseases: Secondary | ICD-10-CM | POA: Diagnosis not present

## 2020-05-12 DIAGNOSIS — F0390 Unspecified dementia without behavioral disturbance: Secondary | ICD-10-CM | POA: Diagnosis not present

## 2020-05-12 DIAGNOSIS — R278 Other lack of coordination: Secondary | ICD-10-CM | POA: Diagnosis not present

## 2020-05-12 DIAGNOSIS — F209 Schizophrenia, unspecified: Secondary | ICD-10-CM | POA: Diagnosis not present

## 2020-05-14 DIAGNOSIS — R2681 Unsteadiness on feet: Secondary | ICD-10-CM | POA: Diagnosis not present

## 2020-05-14 DIAGNOSIS — M6281 Muscle weakness (generalized): Secondary | ICD-10-CM | POA: Diagnosis not present

## 2020-05-15 DIAGNOSIS — R278 Other lack of coordination: Secondary | ICD-10-CM | POA: Diagnosis not present

## 2020-05-15 DIAGNOSIS — F209 Schizophrenia, unspecified: Secondary | ICD-10-CM | POA: Diagnosis not present

## 2020-05-15 DIAGNOSIS — F0390 Unspecified dementia without behavioral disturbance: Secondary | ICD-10-CM | POA: Diagnosis not present

## 2020-05-16 DIAGNOSIS — M6281 Muscle weakness (generalized): Secondary | ICD-10-CM | POA: Diagnosis not present

## 2020-05-16 DIAGNOSIS — M545 Low back pain: Secondary | ICD-10-CM | POA: Diagnosis not present

## 2020-05-16 DIAGNOSIS — Z20828 Contact with and (suspected) exposure to other viral communicable diseases: Secondary | ICD-10-CM | POA: Diagnosis not present

## 2020-05-16 DIAGNOSIS — R2681 Unsteadiness on feet: Secondary | ICD-10-CM | POA: Diagnosis not present

## 2020-05-16 DIAGNOSIS — U071 COVID-19: Secondary | ICD-10-CM | POA: Diagnosis not present

## 2020-05-18 DIAGNOSIS — U071 COVID-19: Secondary | ICD-10-CM | POA: Diagnosis not present

## 2020-05-18 DIAGNOSIS — Z20828 Contact with and (suspected) exposure to other viral communicable diseases: Secondary | ICD-10-CM | POA: Diagnosis not present

## 2020-05-18 DIAGNOSIS — F209 Schizophrenia, unspecified: Secondary | ICD-10-CM | POA: Diagnosis not present

## 2020-05-18 DIAGNOSIS — R278 Other lack of coordination: Secondary | ICD-10-CM | POA: Diagnosis not present

## 2020-05-18 DIAGNOSIS — F0390 Unspecified dementia without behavioral disturbance: Secondary | ICD-10-CM | POA: Diagnosis not present

## 2020-05-19 DIAGNOSIS — Z23 Encounter for immunization: Secondary | ICD-10-CM | POA: Diagnosis not present

## 2020-05-21 DIAGNOSIS — F209 Schizophrenia, unspecified: Secondary | ICD-10-CM | POA: Diagnosis not present

## 2020-05-21 DIAGNOSIS — I10 Essential (primary) hypertension: Secondary | ICD-10-CM | POA: Diagnosis not present

## 2020-05-22 DIAGNOSIS — F0390 Unspecified dementia without behavioral disturbance: Secondary | ICD-10-CM | POA: Diagnosis not present

## 2020-05-22 DIAGNOSIS — F209 Schizophrenia, unspecified: Secondary | ICD-10-CM | POA: Diagnosis not present

## 2020-05-22 DIAGNOSIS — R278 Other lack of coordination: Secondary | ICD-10-CM | POA: Diagnosis not present

## 2020-05-23 DIAGNOSIS — R2681 Unsteadiness on feet: Secondary | ICD-10-CM | POA: Diagnosis not present

## 2020-05-23 DIAGNOSIS — Z20828 Contact with and (suspected) exposure to other viral communicable diseases: Secondary | ICD-10-CM | POA: Diagnosis not present

## 2020-05-23 DIAGNOSIS — M6281 Muscle weakness (generalized): Secondary | ICD-10-CM | POA: Diagnosis not present

## 2020-05-23 DIAGNOSIS — M545 Low back pain, unspecified: Secondary | ICD-10-CM | POA: Diagnosis not present

## 2020-05-23 DIAGNOSIS — U071 COVID-19: Secondary | ICD-10-CM | POA: Diagnosis not present

## 2020-05-25 DIAGNOSIS — M6281 Muscle weakness (generalized): Secondary | ICD-10-CM | POA: Diagnosis not present

## 2020-05-25 DIAGNOSIS — R2681 Unsteadiness on feet: Secondary | ICD-10-CM | POA: Diagnosis not present

## 2020-05-25 DIAGNOSIS — U071 COVID-19: Secondary | ICD-10-CM | POA: Diagnosis not present

## 2020-05-25 DIAGNOSIS — M545 Low back pain, unspecified: Secondary | ICD-10-CM | POA: Diagnosis not present

## 2020-05-25 DIAGNOSIS — Z20828 Contact with and (suspected) exposure to other viral communicable diseases: Secondary | ICD-10-CM | POA: Diagnosis not present

## 2020-05-26 DIAGNOSIS — R278 Other lack of coordination: Secondary | ICD-10-CM | POA: Diagnosis not present

## 2020-05-26 DIAGNOSIS — F209 Schizophrenia, unspecified: Secondary | ICD-10-CM | POA: Diagnosis not present

## 2020-05-26 DIAGNOSIS — F0390 Unspecified dementia without behavioral disturbance: Secondary | ICD-10-CM | POA: Diagnosis not present

## 2020-05-29 DIAGNOSIS — F209 Schizophrenia, unspecified: Secondary | ICD-10-CM | POA: Diagnosis not present

## 2020-05-29 DIAGNOSIS — R278 Other lack of coordination: Secondary | ICD-10-CM | POA: Diagnosis not present

## 2020-05-29 DIAGNOSIS — F0390 Unspecified dementia without behavioral disturbance: Secondary | ICD-10-CM | POA: Diagnosis not present

## 2020-05-30 DIAGNOSIS — R2681 Unsteadiness on feet: Secondary | ICD-10-CM | POA: Diagnosis not present

## 2020-05-30 DIAGNOSIS — Z20828 Contact with and (suspected) exposure to other viral communicable diseases: Secondary | ICD-10-CM | POA: Diagnosis not present

## 2020-05-30 DIAGNOSIS — M545 Low back pain, unspecified: Secondary | ICD-10-CM | POA: Diagnosis not present

## 2020-05-30 DIAGNOSIS — M6281 Muscle weakness (generalized): Secondary | ICD-10-CM | POA: Diagnosis not present

## 2020-05-30 DIAGNOSIS — U071 COVID-19: Secondary | ICD-10-CM | POA: Diagnosis not present

## 2020-05-31 DIAGNOSIS — F0390 Unspecified dementia without behavioral disturbance: Secondary | ICD-10-CM | POA: Diagnosis not present

## 2020-05-31 DIAGNOSIS — R278 Other lack of coordination: Secondary | ICD-10-CM | POA: Diagnosis not present

## 2020-05-31 DIAGNOSIS — F209 Schizophrenia, unspecified: Secondary | ICD-10-CM | POA: Diagnosis not present

## 2020-06-01 DIAGNOSIS — M545 Low back pain, unspecified: Secondary | ICD-10-CM | POA: Diagnosis not present

## 2020-06-01 DIAGNOSIS — M6281 Muscle weakness (generalized): Secondary | ICD-10-CM | POA: Diagnosis not present

## 2020-06-01 DIAGNOSIS — Z20828 Contact with and (suspected) exposure to other viral communicable diseases: Secondary | ICD-10-CM | POA: Diagnosis not present

## 2020-06-01 DIAGNOSIS — U071 COVID-19: Secondary | ICD-10-CM | POA: Diagnosis not present

## 2020-06-01 DIAGNOSIS — R2681 Unsteadiness on feet: Secondary | ICD-10-CM | POA: Diagnosis not present

## 2020-06-03 ENCOUNTER — Other Ambulatory Visit: Payer: Self-pay

## 2020-06-03 ENCOUNTER — Emergency Department (HOSPITAL_COMMUNITY)
Admission: EM | Admit: 2020-06-03 | Discharge: 2020-06-03 | Disposition: A | Discharge status: Home / Self Care | Payer: Medicare Other | Attending: Emergency Medicine | Admitting: Emergency Medicine

## 2020-06-03 ENCOUNTER — Encounter (HOSPITAL_COMMUNITY): Payer: Self-pay

## 2020-06-03 ENCOUNTER — Emergency Department (HOSPITAL_COMMUNITY): Payer: Medicare Other

## 2020-06-03 DIAGNOSIS — R52 Pain, unspecified: Secondary | ICD-10-CM | POA: Diagnosis not present

## 2020-06-03 DIAGNOSIS — I10 Essential (primary) hypertension: Secondary | ICD-10-CM | POA: Diagnosis not present

## 2020-06-03 DIAGNOSIS — M1990 Unspecified osteoarthritis, unspecified site: Secondary | ICD-10-CM

## 2020-06-03 DIAGNOSIS — S8001XA Contusion of right knee, initial encounter: Secondary | ICD-10-CM | POA: Diagnosis not present

## 2020-06-03 DIAGNOSIS — M25461 Effusion, right knee: Secondary | ICD-10-CM | POA: Diagnosis not present

## 2020-06-03 DIAGNOSIS — M1711 Unilateral primary osteoarthritis, right knee: Secondary | ICD-10-CM | POA: Diagnosis not present

## 2020-06-03 DIAGNOSIS — W19XXXA Unspecified fall, initial encounter: Secondary | ICD-10-CM | POA: Diagnosis not present

## 2020-06-03 DIAGNOSIS — S8991XA Unspecified injury of right lower leg, initial encounter: Secondary | ICD-10-CM | POA: Diagnosis present

## 2020-06-03 DIAGNOSIS — W07XXXA Fall from chair, initial encounter: Secondary | ICD-10-CM | POA: Insufficient documentation

## 2020-06-03 DIAGNOSIS — M25551 Pain in right hip: Secondary | ICD-10-CM | POA: Diagnosis not present

## 2020-06-03 DIAGNOSIS — Z79899 Other long term (current) drug therapy: Secondary | ICD-10-CM | POA: Insufficient documentation

## 2020-06-03 DIAGNOSIS — M160 Bilateral primary osteoarthritis of hip: Secondary | ICD-10-CM | POA: Diagnosis not present

## 2020-06-03 MED ORDER — ACETAMINOPHEN 325 MG PO TABS
650.0000 mg | ORAL_TABLET | Freq: Once | ORAL | Status: AC
  Administered 2020-06-03: 650 mg via ORAL
  Filled 2020-06-03: qty 2

## 2020-06-03 NOTE — Discharge Instructions (Addendum)
Take over-the-counter medications as needed for pain.  X-rays do not show any signs of fracture or dislocation.  There is swelling in your knee from the fall.  Follow-up with an orthopedic doctor

## 2020-06-03 NOTE — ED Notes (Signed)
Patient transported to X-ray 

## 2020-06-03 NOTE — ED Notes (Signed)
Called [facility] was told they will try and pick pt. Up before supper.

## 2020-06-03 NOTE — ED Provider Notes (Signed)
[facility] EMERGENCY DEPARTMENT Provider Note   CSN: [id number] Arrival date & time: 06/03/20  1323     History Chief Complaint  Patient presents with  . Fall    Stephanie Vincent is a 64 y.o. female.  HPI   Patient presented to ED for evaluation of pain in her hip and knee.  [facility].  She was attempting to sit in a chair when she accidentally slipped and fell.  Patient is now having pain in her hip and in her knee.  She denies any loss of consciousness.  No headache.  No numbness or weakness.  Past Medical History:  Diagnosis Date  . H. pylori infection 06/01/2019   Treated with amoxicillin, Biaxin, Protonix  . HTN (hypertension)   . Manic depression ([facility])   . Schizophrenia ([facility])   . Seizures [facility])     Patient Active Problem List   Diagnosis Date Noted  . Helicobacter pylori gastritis 02/18/2020  . Anemia 05/31/2019  . Schizophrenia ([facility]) 05/31/2019  . Seizure ([facility]) 05/31/2019  . History of weight loss   . Elevated liver enzymes 11/23/2018  . Normocytic anemia 11/23/2018  . Loss of weight 11/23/2018  . Depression 09/24/2018    Past Surgical History:  Procedure Laterality Date  . BIOPSY  06/01/2019   Procedure: BIOPSY;  Surgeon: West Bali, MD;  [facility];  Service: Endoscopy;;  gastric  . COLONOSCOPY  02/2009   Dr. Darrick Penna: slightly torturous colon otherwise normal   . COLONOSCOPY N/A 06/02/2019   Procedure: COLONOSCOPY;  Surgeon: Corbin Ade, MD; redundant colon, 5 mm polyp in the descending colon, otherwise normal exam.  Path with tubular adenoma.  Recommended repeat in 5 years.  . COLONOSCOPY WITH PROPOFOL N/A 06/01/2019   Procedure: COLONOSCOPY WITH PROPOFOL;  Surgeon: West Bali, MD; incomplete due to poor prep  . ESOPHAGOGASTRODUODENOSCOPY (EGD) WITH PROPOFOL N/A 06/01/2019   Procedure: ESOPHAGOGASTRODUODENOSCOPY (EGD) WITH PROPOFOL;  Surgeon: West Bali, MD;  Normal esophagus, gastritis  s/p biopsy, normal duodenum.  Pathology positive for H. Pylori.  Treated with amoxicillin, Biaxin, and Protonix.   Marland Kitchen POLYPECTOMY  06/02/2019   Procedure: POLYPECTOMY;  Surgeon: Corbin Ade, MD;  [facility];  Service: Endoscopy;;  colon     OB History   No obstetric history on file.     Family History  Problem Relation Age of Onset  . Colon cancer Neg Hx   . Colon polyps Neg Hx     Social History   Tobacco Use  . Smoking status: Never Smoker  . Smokeless tobacco: Never Used  Vaping Use  . Vaping Use: Never used  Substance Use Topics  . Alcohol use: No  . Drug use: No    Home Medications Prior to Admission medications   Medication Sig Start Date End Date Taking? Authorizing Provider  acetaminophen (TYLENOL) 500 MG tablet Take 500 mg by mouth daily as needed for mild pain.    [provider]  clonazePAM (KLONOPIN) 1 MG tablet Take 1 tablet (1 mg total) by mouth 2 (two) times daily as needed (agitation). 06/02/19   Johnson, Clanford L, MD  divalproex (DEPAKOTE) 250 MG DR tablet Take 250 mg by mouth 2 (two) times daily.  12/15/15   [provider]  donepezil (ARICEPT) 10 MG tablet Take 10 mg by mouth daily.  12/15/15   [provider]  fluvoxaMINE (LUVOX) 100 MG tablet Take 100 mg by mouth at  bedtime.  12/15/15   [provider]  lisinopril (PRINIVIL,ZESTRIL) 10 MG tablet Take 10 mg by mouth daily.  12/15/15   [provider]  memantine (NAMENDA) 10 MG tablet Take 10 mg by mouth 2 (two) times daily.  12/15/15   [provider]  OLANZapine (ZYPREXA) 10 MG tablet Take 10 mg by mouth daily.    [provider]  pantoprazole (PROTONIX) 40 MG tablet 1 PO BID FOR 10 DAYS THEN ONCE DAILY FOREVER 06/04/19   Fields, Darleene Cleaver, MD  PHENobarbital (LUMINAL) 97.2 MG tablet Take 97.2 mg by mouth at bedtime.  12/09/15   [provider]    Allergies    Patient has no known allergies.  Review of Systems   Review  of Systems  All other systems reviewed and are negative.   Physical Exam Updated Vital Signs BP 122/73   Pulse 75   Temp 98 F (36.7 C) (Oral)   Resp 18   Ht 1.6 m (5\' 3" )   Wt 77.1 kg   SpO2 100%   BMI 30.11 kg/m   Physical Exam Vitals and nursing note reviewed.  Constitutional:      General: She is not in acute distress.    Appearance: She is well-developed.  HENT:     Head: Normocephalic and atraumatic.     Right Ear: External ear normal.     Left Ear: External ear normal.  Eyes:     General: No scleral icterus.       Right eye: No discharge.        Left eye: No discharge.     Conjunctiva/sclera: Conjunctivae normal.  Neck:     Trachea: No tracheal deviation.  Cardiovascular:     Rate and Rhythm: Normal rate and regular rhythm.  Pulmonary:     Effort: Pulmonary effort is normal. No respiratory distress.     Breath sounds: Normal breath sounds. No stridor. No wheezing or rales.  Abdominal:     General: Bowel sounds are normal. There is no distension.     Palpations: Abdomen is soft.     Tenderness: There is no abdominal tenderness. There is no guarding or rebound.  Musculoskeletal:        General: Tenderness present.     Cervical back: Neck supple.     Comments: Patient has tenderness palpation right knee and right hip, patient does appear to have edema around the right knee but does appear to be similar to the left knee which is nontender  Skin:    General: Skin is warm and dry.     Findings: No rash.  Neurological:     Mental Status: She is alert.     Cranial Nerves: No cranial nerve deficit (no facial droop, extraocular movements intact, no slurred speech).     Sensory: No sensory deficit.     Motor: No abnormal muscle tone or seizure activity.     Coordination: Coordination normal.     ED Results / Procedures / Treatments   Labs (all labs ordered are listed, but only abnormal results are displayed) Labs Reviewed - No data to  display  EKG None  Radiology DG Knee Complete 4 Views Right  Result Date: 06/03/2020 CLINICAL DATA:  Pain following fall EXAM: RIGHT KNEE - COMPLETE 4+ VIEW COMPARISON:  None. FINDINGS: Frontal, lateral, and bilateral oblique views were obtained. There is no fracture or dislocation. There is a focal moderate joint effusion. There is mild spurring in all compartments. No appreciable  joint space narrowing, however. No erosive change. IMPRESSION: Moderate joint effusion. No fracture or dislocation. Mild spurring in all compartments without appreciable joint space narrowing. Electronically Signed   By: Bretta Bang III M.D.   On: 06/03/2020 14:46   DG Hip Unilat W or Wo Pelvis 2-3 Views Right  Result Date: 06/03/2020 CLINICAL DATA:  Pain following fall EXAM: DG HIP (WITH OR WITHOUT PELVIS) 2-3V RIGHT COMPARISON:  None. FINDINGS: Frontal pelvis as well as frontal and lateral right hip joint images were obtained. There is no fracture or dislocation. There is slight symmetric narrowing of each hip joint. No erosive change. Sacroiliac joints appear unremarkable bilaterally. IMPRESSION: Slight symmetric narrowing of each hip joint. No fracture or dislocation. Electronically Signed   By: Bretta Bang III M.D.   On: 06/03/2020 14:47    Procedures Procedures (including critical care time)  Medications Ordered in ED Medications  acetaminophen (TYLENOL) tablet 650 mg (has no administration in time range)    ED Course  I have reviewed the triage vital signs and the nursing notes.  Pertinent labs & imaging results that were available during my care of the patient were reviewed by me and considered in my medical decision making (see chart for details).    MDM Rules/Calculators/A&P                          X-rays do not show any signs of fracture.  She does have some signs of arthritis in both her hips and knees.  Patient does have an effusion of the right knee.  Patient does have evidence  of arthritis.  Is possible the effusion may be related after this could be posttraumatic.  Will place patient in the knee sleeve and also provide crutches.  Appears stable for discharge with outpatient orthopedic follow-up. Final Clinical Impression(s) / ED Diagnoses Final diagnoses:  Contusion of right knee, initial encounter  Arthritis  Effusion of right knee    Rx / DC Orders ED Discharge Orders    None       Linwood Dibbles, MD 06/03/20 1559

## 2020-06-03 NOTE — ED Triage Notes (Signed)
Pt brought to ED via [facility] for fall from [facility]. Pt states she missed the chair and fell. Pt c/o R knee and hip pain. Pt denies LOC or hitting head

## 2020-06-05 DIAGNOSIS — F0390 Unspecified dementia without behavioral disturbance: Secondary | ICD-10-CM | POA: Diagnosis not present

## 2020-06-05 DIAGNOSIS — F209 Schizophrenia, unspecified: Secondary | ICD-10-CM | POA: Diagnosis not present

## 2020-06-05 DIAGNOSIS — R278 Other lack of coordination: Secondary | ICD-10-CM | POA: Diagnosis not present

## 2020-06-06 DIAGNOSIS — Z20828 Contact with and (suspected) exposure to other viral communicable diseases: Secondary | ICD-10-CM | POA: Diagnosis not present

## 2020-06-06 DIAGNOSIS — M545 Low back pain, unspecified: Secondary | ICD-10-CM | POA: Diagnosis not present

## 2020-06-06 DIAGNOSIS — M6281 Muscle weakness (generalized): Secondary | ICD-10-CM | POA: Diagnosis not present

## 2020-06-06 DIAGNOSIS — R2681 Unsteadiness on feet: Secondary | ICD-10-CM | POA: Diagnosis not present

## 2020-06-06 DIAGNOSIS — U071 COVID-19: Secondary | ICD-10-CM | POA: Diagnosis not present

## 2020-06-08 DIAGNOSIS — M545 Low back pain, unspecified: Secondary | ICD-10-CM | POA: Diagnosis not present

## 2020-06-08 DIAGNOSIS — M6281 Muscle weakness (generalized): Secondary | ICD-10-CM | POA: Diagnosis not present

## 2020-06-08 DIAGNOSIS — G40909 Epilepsy, unspecified, not intractable, without status epilepticus: Secondary | ICD-10-CM | POA: Diagnosis not present

## 2020-06-08 DIAGNOSIS — Z20828 Contact with and (suspected) exposure to other viral communicable diseases: Secondary | ICD-10-CM | POA: Diagnosis not present

## 2020-06-08 DIAGNOSIS — U071 COVID-19: Secondary | ICD-10-CM | POA: Diagnosis not present

## 2020-06-08 DIAGNOSIS — M25461 Effusion, right knee: Secondary | ICD-10-CM | POA: Diagnosis not present

## 2020-06-08 DIAGNOSIS — R2681 Unsteadiness on feet: Secondary | ICD-10-CM | POA: Diagnosis not present

## 2020-06-08 DIAGNOSIS — W19XXXD Unspecified fall, subsequent encounter: Secondary | ICD-10-CM | POA: Diagnosis not present

## 2020-06-09 DIAGNOSIS — G40909 Epilepsy, unspecified, not intractable, without status epilepticus: Secondary | ICD-10-CM | POA: Diagnosis not present

## 2020-06-09 DIAGNOSIS — M6281 Muscle weakness (generalized): Secondary | ICD-10-CM | POA: Diagnosis not present

## 2020-06-09 DIAGNOSIS — F209 Schizophrenia, unspecified: Secondary | ICD-10-CM | POA: Diagnosis not present

## 2020-06-09 DIAGNOSIS — G8911 Acute pain due to trauma: Secondary | ICD-10-CM | POA: Diagnosis not present

## 2020-06-09 DIAGNOSIS — D649 Anemia, unspecified: Secondary | ICD-10-CM | POA: Diagnosis not present

## 2020-06-09 DIAGNOSIS — I10 Essential (primary) hypertension: Secondary | ICD-10-CM | POA: Diagnosis not present

## 2020-06-09 DIAGNOSIS — W19XXXD Unspecified fall, subsequent encounter: Secondary | ICD-10-CM | POA: Diagnosis not present

## 2020-06-09 DIAGNOSIS — R634 Abnormal weight loss: Secondary | ICD-10-CM | POA: Diagnosis not present

## 2020-06-09 DIAGNOSIS — R262 Difficulty in walking, not elsewhere classified: Secondary | ICD-10-CM | POA: Diagnosis not present

## 2020-06-09 DIAGNOSIS — M25461 Effusion, right knee: Secondary | ICD-10-CM | POA: Diagnosis not present

## 2020-06-09 DIAGNOSIS — M1711 Unilateral primary osteoarthritis, right knee: Secondary | ICD-10-CM | POA: Diagnosis not present

## 2020-06-13 DIAGNOSIS — M25561 Pain in right knee: Secondary | ICD-10-CM | POA: Diagnosis not present

## 2020-06-13 DIAGNOSIS — M25461 Effusion, right knee: Secondary | ICD-10-CM | POA: Diagnosis not present

## 2020-06-13 DIAGNOSIS — G8911 Acute pain due to trauma: Secondary | ICD-10-CM | POA: Diagnosis not present

## 2020-06-13 DIAGNOSIS — W19XXXD Unspecified fall, subsequent encounter: Secondary | ICD-10-CM | POA: Diagnosis not present

## 2020-06-13 DIAGNOSIS — M1711 Unilateral primary osteoarthritis, right knee: Secondary | ICD-10-CM | POA: Diagnosis not present

## 2020-06-13 DIAGNOSIS — D649 Anemia, unspecified: Secondary | ICD-10-CM | POA: Diagnosis not present

## 2020-06-13 DIAGNOSIS — R262 Difficulty in walking, not elsewhere classified: Secondary | ICD-10-CM | POA: Diagnosis not present

## 2020-06-14 DIAGNOSIS — D649 Anemia, unspecified: Secondary | ICD-10-CM | POA: Diagnosis not present

## 2020-06-14 DIAGNOSIS — W19XXXD Unspecified fall, subsequent encounter: Secondary | ICD-10-CM | POA: Diagnosis not present

## 2020-06-14 DIAGNOSIS — G8911 Acute pain due to trauma: Secondary | ICD-10-CM | POA: Diagnosis not present

## 2020-06-14 DIAGNOSIS — M25461 Effusion, right knee: Secondary | ICD-10-CM | POA: Diagnosis not present

## 2020-06-14 DIAGNOSIS — R262 Difficulty in walking, not elsewhere classified: Secondary | ICD-10-CM | POA: Diagnosis not present

## 2020-06-14 DIAGNOSIS — M1711 Unilateral primary osteoarthritis, right knee: Secondary | ICD-10-CM | POA: Diagnosis not present

## 2020-06-15 DIAGNOSIS — M25461 Effusion, right knee: Secondary | ICD-10-CM | POA: Diagnosis not present

## 2020-06-15 DIAGNOSIS — M1711 Unilateral primary osteoarthritis, right knee: Secondary | ICD-10-CM | POA: Diagnosis not present

## 2020-06-15 DIAGNOSIS — Z20828 Contact with and (suspected) exposure to other viral communicable diseases: Secondary | ICD-10-CM | POA: Diagnosis not present

## 2020-06-15 DIAGNOSIS — G8911 Acute pain due to trauma: Secondary | ICD-10-CM | POA: Diagnosis not present

## 2020-06-15 DIAGNOSIS — R262 Difficulty in walking, not elsewhere classified: Secondary | ICD-10-CM | POA: Diagnosis not present

## 2020-06-15 DIAGNOSIS — U071 COVID-19: Secondary | ICD-10-CM | POA: Diagnosis not present

## 2020-06-15 DIAGNOSIS — W19XXXD Unspecified fall, subsequent encounter: Secondary | ICD-10-CM | POA: Diagnosis not present

## 2020-06-15 DIAGNOSIS — D649 Anemia, unspecified: Secondary | ICD-10-CM | POA: Diagnosis not present

## 2020-06-16 DIAGNOSIS — G8911 Acute pain due to trauma: Secondary | ICD-10-CM | POA: Diagnosis not present

## 2020-06-16 DIAGNOSIS — M1711 Unilateral primary osteoarthritis, right knee: Secondary | ICD-10-CM | POA: Diagnosis not present

## 2020-06-16 DIAGNOSIS — M25461 Effusion, right knee: Secondary | ICD-10-CM | POA: Diagnosis not present

## 2020-06-16 DIAGNOSIS — W19XXXD Unspecified fall, subsequent encounter: Secondary | ICD-10-CM | POA: Diagnosis not present

## 2020-06-16 DIAGNOSIS — R262 Difficulty in walking, not elsewhere classified: Secondary | ICD-10-CM | POA: Diagnosis not present

## 2020-06-16 DIAGNOSIS — D649 Anemia, unspecified: Secondary | ICD-10-CM | POA: Diagnosis not present

## 2020-06-19 ENCOUNTER — Encounter (HOSPITAL_COMMUNITY): Payer: Self-pay

## 2020-06-19 ENCOUNTER — Other Ambulatory Visit: Payer: Self-pay

## 2020-06-19 ENCOUNTER — Emergency Department (HOSPITAL_COMMUNITY): Payer: Medicare Other

## 2020-06-19 ENCOUNTER — Emergency Department (HOSPITAL_COMMUNITY)
Admission: EM | Admit: 2020-06-19 | Discharge: 2020-06-19 | Disposition: A | Discharge status: Home / Self Care | Payer: Medicare Other | Attending: Emergency Medicine | Admitting: Emergency Medicine

## 2020-06-19 DIAGNOSIS — R0789 Other chest pain: Secondary | ICD-10-CM | POA: Diagnosis not present

## 2020-06-19 DIAGNOSIS — G8911 Acute pain due to trauma: Secondary | ICD-10-CM | POA: Diagnosis not present

## 2020-06-19 DIAGNOSIS — Z79899 Other long term (current) drug therapy: Secondary | ICD-10-CM | POA: Diagnosis not present

## 2020-06-19 DIAGNOSIS — Z20822 Contact with and (suspected) exposure to covid-19: Secondary | ICD-10-CM | POA: Insufficient documentation

## 2020-06-19 DIAGNOSIS — R262 Difficulty in walking, not elsewhere classified: Secondary | ICD-10-CM | POA: Diagnosis not present

## 2020-06-19 DIAGNOSIS — F209 Schizophrenia, unspecified: Secondary | ICD-10-CM | POA: Diagnosis not present

## 2020-06-19 DIAGNOSIS — R079 Chest pain, unspecified: Secondary | ICD-10-CM

## 2020-06-19 DIAGNOSIS — I10 Essential (primary) hypertension: Secondary | ICD-10-CM | POA: Insufficient documentation

## 2020-06-19 DIAGNOSIS — J90 Pleural effusion, not elsewhere classified: Secondary | ICD-10-CM | POA: Diagnosis not present

## 2020-06-19 DIAGNOSIS — R42 Dizziness and giddiness: Secondary | ICD-10-CM | POA: Diagnosis not present

## 2020-06-19 DIAGNOSIS — M1711 Unilateral primary osteoarthritis, right knee: Secondary | ICD-10-CM | POA: Diagnosis not present

## 2020-06-19 DIAGNOSIS — M25461 Effusion, right knee: Secondary | ICD-10-CM | POA: Diagnosis not present

## 2020-06-19 DIAGNOSIS — W19XXXD Unspecified fall, subsequent encounter: Secondary | ICD-10-CM | POA: Diagnosis not present

## 2020-06-19 DIAGNOSIS — D649 Anemia, unspecified: Secondary | ICD-10-CM | POA: Diagnosis not present

## 2020-06-19 LAB — COMPREHENSIVE METABOLIC PANEL
ALT: 29 U/L (ref 0–44)
AST: 20 U/L (ref 15–41)
Albumin: 3.6 g/dL (ref 3.5–5.0)
Alkaline Phosphatase: 67 U/L (ref 38–126)
Anion gap: 9 (ref 5–15)
BUN: 13 mg/dL (ref 8–23)
CO2: 26 mmol/L (ref 22–32)
Calcium: 8.9 mg/dL (ref 8.9–10.3)
Chloride: 97 mmol/L — ABNORMAL LOW (ref 98–111)
Creatinine, Ser: 0.45 mg/dL (ref 0.44–1.00)
GFR, Estimated: >60 mL/min (ref >60)
Glucose, Bld: 131 mg/dL — ABNORMAL HIGH (ref 70–99)
Potassium: 3.9 mmol/L (ref 3.5–5.1)
Sodium: 132 mmol/L — ABNORMAL LOW (ref 135–145)
Total Bilirubin: 0.3 mg/dL (ref 0.3–1.2)
Total Protein: 7 g/dL (ref 6.5–8.1)

## 2020-06-19 LAB — CBC WITH DIFFERENTIAL/PLATELET
Abs Immature Granulocytes: 0.01 10*3/uL (ref 0.00–0.07)
Basophils Absolute: 0 10*3/uL (ref 0.0–0.1)
Basophils Relative: 1 %
Eosinophils Absolute: 0.1 10*3/uL (ref 0.0–0.5)
Eosinophils Relative: 2 %
HCT: 44.7 % (ref 36.0–46.0)
Hemoglobin: 14.1 g/dL (ref 12.0–15.0)
Immature Granulocytes: 0 %
Lymphocytes Relative: 54 %
Lymphs Abs: 3.3 10*3/uL (ref 0.7–4.0)
MCH: 29.9 pg (ref 26.0–34.0)
MCHC: 31.5 g/dL (ref 30.0–36.0)
MCV: 94.7 fL (ref 80.0–100.0)
Monocytes Absolute: 0.3 10*3/uL (ref 0.1–1.0)
Monocytes Relative: 5 %
Neutro Abs: 2.3 10*3/uL (ref 1.7–7.7)
Neutrophils Relative %: 38 %
Platelets: 307 10*3/uL (ref 150–400)
RBC: 4.72 MIL/uL (ref 3.87–5.11)
RDW: 12.3 % (ref 11.5–15.5)
WBC: 6.1 10*3/uL (ref 4.0–10.5)
nRBC: 0 % (ref 0.0–0.2)

## 2020-06-19 LAB — TROPONIN I (HIGH SENSITIVITY)
Troponin I (High Sensitivity): 4 ng/L (ref <18)
Troponin I (High Sensitivity): 4 ng/L (ref <18)

## 2020-06-19 LAB — RESPIRATORY PANEL BY RT PCR (FLU A&B, COVID)
Influenza A by PCR: NEGATIVE
Influenza B by PCR: NEGATIVE
SARS Coronavirus 2 by RT PCR: NEGATIVE

## 2020-06-19 LAB — LIPASE, BLOOD: Lipase: 31 U/L (ref 11–51)

## 2020-06-19 MED ORDER — LIDOCAINE VISCOUS HCL 2 % MT SOLN
15.0000 mL | Freq: Once | OROMUCOSAL | Status: AC
  Administered 2020-06-19: 15 mL via ORAL
  Filled 2020-06-19: qty 15

## 2020-06-19 MED ORDER — ALUM & MAG HYDROXIDE-SIMETH 200-200-20 MG/5ML PO SUSP
30.0000 mL | Freq: Once | ORAL | Status: AC
  Administered 2020-06-19: 30 mL via ORAL
  Filled 2020-06-19: qty 30

## 2020-06-19 NOTE — ED Provider Notes (Signed)
  Face-to-face evaluation   History: Patient presents from her care facility for evaluation of chest discomfort.  She is unable to give a history.  Physical exam: Alert, calm and comfortable.  No distress.  Abdomen soft and nontender.  No respiratory distress.  Calm and cooperative.  She is confused  Medical screening examination/treatment/procedure(s) were conducted as a shared visit with non-physician practitioner(s) and myself.  I personally evaluated the patient during the encounter    Mancel Bale, MD 06/20/20 1227

## 2020-06-19 NOTE — ED Notes (Signed)
Gave report to [place] at [facility] and informed her pt is ready for discharge, verbalized understanding

## 2020-06-19 NOTE — Discharge Instructions (Signed)
Your work-up today was reassuring.  Please follow-up with your primary care doctor.  Return to the Emergency Department immediately if you experiencing worsening chest pain, difficulty breathing, nausea/vomiting, get very sweaty, headache or any other worsening or concerning symptoms.

## 2020-06-19 NOTE — ED Notes (Signed)
PO sprite given per provider request.

## 2020-06-19 NOTE — ED Notes (Signed)
Pt tolerated PO fluids well  

## 2020-06-19 NOTE — ED Triage Notes (Signed)
From [facility] facility, EMS states she was having chest pain.

## 2020-06-19 NOTE — ED Provider Notes (Signed)
[facility] EMERGENCY DEPARTMENT Provider Note   CSN: [id number] Arrival date & time: 06/19/20  1330     History Chief Complaint  Patient presents with  . Chest Pain    Caleb PoppFrances L Vincent is a 64 y.o. female past medical history of H. pylori infection, hypertension, manic depression, schizophrenia who presents for evaluation of chest pain that began earlier today after eating lunch. She reports that she ate lunch and shortly after started having pain in her epigastric and chest area. She has not had any vomiting or diarrhea. She has had some mild SOB. She was feeling her normal state of health today prior to onset of symptoms. Patient is a very poor historian.   EM LEVEL 5 CAVEAT DUE TO H/O PSYCHIATRIC ILLNESS AND DEMENTIA    The history is provided by the patient.       Past Medical History:  Diagnosis Date  . H. pylori infection 06/01/2019   Treated with amoxicillin, Biaxin, Protonix  . HTN (hypertension)   . Manic depression (HCC)   . Schizophrenia (HCC)   . Seizures [facility](HCC)     Patient Active Problem List   Diagnosis Date Noted  . Helicobacter pylori gastritis 02/18/2020  . Anemia 05/31/2019  . Schizophrenia (HCC) 05/31/2019  . Seizure (HCC) 05/31/2019  . History of weight loss   . Elevated liver enzymes 11/23/2018  . Normocytic anemia 11/23/2018  . Loss of weight 11/23/2018  . Depression 09/24/2018    Past Surgical History:  Procedure Laterality Date  . BIOPSY  06/01/2019   Procedure: BIOPSY;  Surgeon: West BaliFields, Sandi L, MD;  [facility];  Service: Endoscopy;;  gastric  . COLONOSCOPY  02/2009   Dr. Darrick PennaFields: slightly torturous colon otherwise normal   . COLONOSCOPY N/A 06/02/2019   Procedure: COLONOSCOPY;  Surgeon: Corbin Adeourk, Robert M, MD; redundant colon, 5 mm polyp in the descending colon, otherwise normal exam.  Path with tubular adenoma.  Recommended repeat in 5 years.  . COLONOSCOPY WITH PROPOFOL N/A 06/01/2019   Procedure: COLONOSCOPY WITH  PROPOFOL;  Surgeon: West BaliFields, Sandi L, MD; incomplete due to poor prep  . ESOPHAGOGASTRODUODENOSCOPY (EGD) WITH PROPOFOL N/A 06/01/2019   Procedure: ESOPHAGOGASTRODUODENOSCOPY (EGD) WITH PROPOFOL;  Surgeon: West BaliFields, Sandi L, MD;  Normal esophagus, gastritis s/p biopsy, normal duodenum.  Pathology positive for H. Pylori.  Treated with amoxicillin, Biaxin, and Protonix.   Marland Kitchen. POLYPECTOMY  06/02/2019   Procedure: POLYPECTOMY;  Surgeon: Corbin Adeourk, Robert M, MD;  [facility];  Service: Endoscopy;;  colon     OB History   No obstetric history on file.     Family History  Problem Relation Age of Onset  . Colon cancer Neg Hx   . Colon polyps Neg Hx     Social History   Tobacco Use  . Smoking status: Never Smoker  . Smokeless tobacco: Never Used  Vaping Use  . Vaping Use: Never used  Substance Use Topics  . Alcohol use: No  . Drug use: No    Home Medications Prior to Admission medications   Medication Sig Start Date End Date Taking? Authorizing Provider  clonazePAM (KLONOPIN) 1 MG tablet Take 1 tablet (1 mg total) by mouth 2 (two) times daily as needed (agitation). 06/02/19  Yes Johnson, Clanford L, MD  divalproex (DEPAKOTE) 250 MG DR tablet Take 250 mg by mouth 2 (two) times daily.  12/15/15  Yes [provider]  donepezil (ARICEPT) 10 MG tablet Take 10 mg by mouth daily.  12/15/15  Yes  [provider]  fluvoxaMINE (LUVOX) 100 MG tablet Take 100 mg by mouth at bedtime.  12/15/15  Yes [provider]  lisinopril (PRINIVIL,ZESTRIL) 10 MG tablet Take 10 mg by mouth daily.  12/15/15  Yes [provider]  memantine (NAMENDA) 10 MG tablet Take 10 mg by mouth 2 (two) times daily.  12/15/15  Yes [provider]  OLANZapine (ZYPREXA) 2.5 MG tablet Take 2.5 mg by mouth in the morning.   Yes [provider]  OLANZapine (ZYPREXA) 5 MG tablet Take 5 mg by mouth at bedtime.   Yes [provider]  pantoprazole (PROTONIX) 40 MG tablet 1 PO  BID FOR 10 DAYS THEN ONCE DAILY FOREVER 06/04/19  Yes Fields, Sandi L, MD  PHENobarbital (LUMINAL) 97.2 MG tablet Take 97.2 mg by mouth at bedtime.  12/09/15  Yes [provider]    Allergies    Patient has no known allergies.  Review of Systems   Review of Systems  Unable to perform ROS: Psychiatric disorder    Physical Exam Updated Vital Signs BP 111/87   Pulse 66   Temp 97.6 F (36.4 C) (Oral)   Resp 13   Ht 5\' 3"  (1.6 m)   Wt 77.1 kg   SpO2 97%   BMI 30.11 kg/m   Physical Exam Vitals and nursing note reviewed.  Constitutional:      Appearance: Normal appearance. She is well-developed.  HENT:     Head: Normocephalic and atraumatic.  Eyes:     General: Lids are normal.     Conjunctiva/sclera: Conjunctivae normal.     Pupils: Pupils are equal, round, and reactive to light.  Cardiovascular:     Rate and Rhythm: Normal rate and regular rhythm.     Pulses: Normal pulses.          Radial pulses are 2+ on the right side and 2+ on the left side.       Dorsalis pedis pulses are 2+ on the right side and 2+ on the left side.     Heart sounds: Normal heart sounds. No murmur heard.  No friction rub. No gallop.   Pulmonary:     Effort: Pulmonary effort is normal.     Breath sounds: Normal breath sounds.     Comments: Lungs clear to auscultation bilaterally.  Symmetric chest rise.  No wheezing, rales, rhonchi. Abdominal:     Palpations: Abdomen is soft. Abdomen is not rigid.     Tenderness: There is abdominal tenderness in the epigastric area. There is no guarding.     Comments: Abdomen is soft, nondistended.  Tenderness palpation epigastric region.  No rigidity, guarding.  Musculoskeletal:        General: Normal range of motion.     Cervical back: Full passive range of motion without pain.     Comments: BLE are symmetric in appearance without any overlying warmth, erythema.  Knee brace noted on right lower extremity.  Skin:    General: Skin is warm and dry.      Capillary Refill: Capillary refill takes less than 2 seconds.  Neurological:     Mental Status: She is alert and oriented to person, place, and time.  Psychiatric:        Speech: Speech normal.     ED Results / Procedures / Treatments   Labs (all labs ordered are listed, but only abnormal results are displayed) Labs Reviewed  COMPREHENSIVE METABOLIC PANEL - Abnormal; Notable for the following components:  Result Value   Sodium 132 (*)    Chloride 97 (*)    Glucose, Bld 131 (*)    All other components within normal limits  RESPIRATORY PANEL BY RT PCR (FLU A&B, COVID)  CBC WITH DIFFERENTIAL/PLATELET  LIPASE, BLOOD  TROPONIN I (HIGH SENSITIVITY)  TROPONIN I (HIGH SENSITIVITY)    EKG EKG Interpretation  Date/Time:  Monday June 19 2020 13:39:40 EDT Ventricular Rate:  84 PR Interval:    QRS Duration: 78 QT Interval:  360 QTC Calculation: 426 R Axis:   60 Text Interpretation: Sinus rhythm Nonspecific repol abnormality, inferior leads ST elevation, consider lateral injury No STEMI Confirmed by Alona Bene [phone]) on 06/19/2020 1:52:02 PM   Radiology DG Chest 2 View  Result Date: 06/19/2020 CLINICAL DATA:  Chest pain and dizziness. EXAM: CHEST - 2 VIEW COMPARISON:  September 23, 2018. FINDINGS: Cardiac silhouette is accentuated by low lung volumes. Mild diffuse interstitial prominence and patchy bilateral airspace opacities. No visible pleural effusions or pneumothorax. No acute osseous abnormality. IMPRESSION: Mild diffuse interstitial prominence and patchy bilateral airspace opacities, which could represent mild edema or infection. Electronically Signed   By: Feliberto Harts MD   On: 06/19/2020 14:41    Procedures Procedures (including critical care time)  Medications Ordered in ED Medications  alum & mag hydroxide-simeth (MAALOX/MYLANTA) 200-200-20 MG/5ML suspension 30 mL (30 mLs Oral Given 06/19/20 1409)    And  lidocaine (XYLOCAINE) 2 % viscous mouth solution 15  mL (15 mLs Oral Given 06/19/20 1409)    ED Course  I have reviewed the triage vital signs and the nursing notes.  Pertinent labs & imaging results that were available during my care of the patient were reviewed by me and considered in my medical decision making (see chart for details).    MDM Rules/Calculators/A&P                          64 year old female who presents for evaluation of chest pain that began today after eating lunch.  History of H. pylori.  She reports pain in the epigastric region that radiates up into her chest.  Patient is a very poor historian.  Initial arrival, she is afebrile nontoxic-appearing.  Vital signs are stable.  On exam, she has tenderness palpation in epigastric region of her abdomen.  Question if this is GERD versus intra-abdominal process.  Also consider ACS but low suspicion.  We will plan to check labs, EKG, chest x-ray.  Review of records shows no evidence of cardiac history.   I discussed with nurse from [facility] who states that patient is at mental baseline and states that patient can sometimes be difficult to converse with.  She does have history of dementia.   Lipase is normal. CBC shows no leukocytosis or anemia. CMP shows BUN and Cr. Trop is negative. CXR shows mild diffuse interstital prominence and patchy bilateral airspace opacities.   Reevaluation.  Patient is sleeping comfortably with no signs of distress.  She states her pain has improved.  Repeat evaluation shows improvement in epigastric tenderness.  She is requesting something to eat.  We will plan to p.o. challenge.  Patient able to tolerate p.o. without any difficulty.  At this time, patient with reassuring EKG, 2 - troponins.  At this time, do not suspect her symptoms are indicative of ACS etiology.  I suspect this is ongoing related to her H. pylori and PUD.  She is on Protonix and seem to  respond to GI cocktail.  At this time, her repeat abdominal exam is improved.  Do not feel that  she needs CT abdomen pelvis is do not suspect surgical abdomen.  She is resting comfortably and has been able to tolerate p.o.  She is seemingly stable. At this time, patient exhibits no emergent life-threatening condition that require further evaluation in ED. Discussed patient with Dr. Effie Shy who is agreeable to plan.   Portions of this note were generated with Scientist, clinical (histocompatibility and immunogenetics). Dictation errors may occur despite best attempts at proofreading.  Final Clinical Impression(s) / ED Diagnoses Final diagnoses:  Nonspecific chest pain    Rx / DC Orders ED Discharge Orders    None       Rosana Hoes 06/20/20 0005    Mancel Bale, MD 06/20/20 1227

## 2020-06-20 DIAGNOSIS — M1711 Unilateral primary osteoarthritis, right knee: Secondary | ICD-10-CM | POA: Diagnosis not present

## 2020-06-20 DIAGNOSIS — Z20828 Contact with and (suspected) exposure to other viral communicable diseases: Secondary | ICD-10-CM | POA: Diagnosis not present

## 2020-06-20 DIAGNOSIS — D649 Anemia, unspecified: Secondary | ICD-10-CM | POA: Diagnosis not present

## 2020-06-20 DIAGNOSIS — W19XXXD Unspecified fall, subsequent encounter: Secondary | ICD-10-CM | POA: Diagnosis not present

## 2020-06-20 DIAGNOSIS — U071 COVID-19: Secondary | ICD-10-CM | POA: Diagnosis not present

## 2020-06-20 DIAGNOSIS — R262 Difficulty in walking, not elsewhere classified: Secondary | ICD-10-CM | POA: Diagnosis not present

## 2020-06-20 DIAGNOSIS — G8911 Acute pain due to trauma: Secondary | ICD-10-CM | POA: Diagnosis not present

## 2020-06-20 DIAGNOSIS — M25461 Effusion, right knee: Secondary | ICD-10-CM | POA: Diagnosis not present

## 2020-06-21 DIAGNOSIS — D649 Anemia, unspecified: Secondary | ICD-10-CM | POA: Diagnosis not present

## 2020-06-21 DIAGNOSIS — W19XXXD Unspecified fall, subsequent encounter: Secondary | ICD-10-CM | POA: Diagnosis not present

## 2020-06-21 DIAGNOSIS — G8911 Acute pain due to trauma: Secondary | ICD-10-CM | POA: Diagnosis not present

## 2020-06-21 DIAGNOSIS — R262 Difficulty in walking, not elsewhere classified: Secondary | ICD-10-CM | POA: Diagnosis not present

## 2020-06-21 DIAGNOSIS — M1711 Unilateral primary osteoarthritis, right knee: Secondary | ICD-10-CM | POA: Diagnosis not present

## 2020-06-21 DIAGNOSIS — M25461 Effusion, right knee: Secondary | ICD-10-CM | POA: Diagnosis not present

## 2020-06-22 DIAGNOSIS — Z20828 Contact with and (suspected) exposure to other viral communicable diseases: Secondary | ICD-10-CM | POA: Diagnosis not present

## 2020-06-22 DIAGNOSIS — F71 Moderate intellectual disabilities: Secondary | ICD-10-CM | POA: Diagnosis not present

## 2020-06-22 DIAGNOSIS — F209 Schizophrenia, unspecified: Secondary | ICD-10-CM | POA: Diagnosis not present

## 2020-06-22 DIAGNOSIS — U071 COVID-19: Secondary | ICD-10-CM | POA: Diagnosis not present

## 2020-06-22 DIAGNOSIS — R1013 Epigastric pain: Secondary | ICD-10-CM | POA: Diagnosis not present

## 2020-06-23 DIAGNOSIS — G8911 Acute pain due to trauma: Secondary | ICD-10-CM | POA: Diagnosis not present

## 2020-06-23 DIAGNOSIS — W19XXXD Unspecified fall, subsequent encounter: Secondary | ICD-10-CM | POA: Diagnosis not present

## 2020-06-23 DIAGNOSIS — R262 Difficulty in walking, not elsewhere classified: Secondary | ICD-10-CM | POA: Diagnosis not present

## 2020-06-23 DIAGNOSIS — M1711 Unilateral primary osteoarthritis, right knee: Secondary | ICD-10-CM | POA: Diagnosis not present

## 2020-06-23 DIAGNOSIS — D649 Anemia, unspecified: Secondary | ICD-10-CM | POA: Diagnosis not present

## 2020-06-23 DIAGNOSIS — M25461 Effusion, right knee: Secondary | ICD-10-CM | POA: Diagnosis not present

## 2020-06-26 DIAGNOSIS — G8911 Acute pain due to trauma: Secondary | ICD-10-CM | POA: Diagnosis not present

## 2020-06-26 DIAGNOSIS — R262 Difficulty in walking, not elsewhere classified: Secondary | ICD-10-CM | POA: Diagnosis not present

## 2020-06-26 DIAGNOSIS — M1711 Unilateral primary osteoarthritis, right knee: Secondary | ICD-10-CM | POA: Diagnosis not present

## 2020-06-26 DIAGNOSIS — W19XXXD Unspecified fall, subsequent encounter: Secondary | ICD-10-CM | POA: Diagnosis not present

## 2020-06-26 DIAGNOSIS — M25461 Effusion, right knee: Secondary | ICD-10-CM | POA: Diagnosis not present

## 2020-06-26 DIAGNOSIS — D649 Anemia, unspecified: Secondary | ICD-10-CM | POA: Diagnosis not present

## 2020-06-27 DIAGNOSIS — R262 Difficulty in walking, not elsewhere classified: Secondary | ICD-10-CM | POA: Diagnosis not present

## 2020-06-27 DIAGNOSIS — G8911 Acute pain due to trauma: Secondary | ICD-10-CM | POA: Diagnosis not present

## 2020-06-27 DIAGNOSIS — W19XXXD Unspecified fall, subsequent encounter: Secondary | ICD-10-CM | POA: Diagnosis not present

## 2020-06-27 DIAGNOSIS — D649 Anemia, unspecified: Secondary | ICD-10-CM | POA: Diagnosis not present

## 2020-06-27 DIAGNOSIS — M25461 Effusion, right knee: Secondary | ICD-10-CM | POA: Diagnosis not present

## 2020-06-27 DIAGNOSIS — Z20828 Contact with and (suspected) exposure to other viral communicable diseases: Secondary | ICD-10-CM | POA: Diagnosis not present

## 2020-06-27 DIAGNOSIS — U071 COVID-19: Secondary | ICD-10-CM | POA: Diagnosis not present

## 2020-06-27 DIAGNOSIS — M1711 Unilateral primary osteoarthritis, right knee: Secondary | ICD-10-CM | POA: Diagnosis not present

## 2020-06-28 DIAGNOSIS — D649 Anemia, unspecified: Secondary | ICD-10-CM | POA: Diagnosis not present

## 2020-06-28 DIAGNOSIS — M25461 Effusion, right knee: Secondary | ICD-10-CM | POA: Diagnosis not present

## 2020-06-28 DIAGNOSIS — M1711 Unilateral primary osteoarthritis, right knee: Secondary | ICD-10-CM | POA: Diagnosis not present

## 2020-06-28 DIAGNOSIS — R262 Difficulty in walking, not elsewhere classified: Secondary | ICD-10-CM | POA: Diagnosis not present

## 2020-06-28 DIAGNOSIS — G8911 Acute pain due to trauma: Secondary | ICD-10-CM | POA: Diagnosis not present

## 2020-06-28 DIAGNOSIS — W19XXXD Unspecified fall, subsequent encounter: Secondary | ICD-10-CM | POA: Diagnosis not present

## 2020-06-29 DIAGNOSIS — M1711 Unilateral primary osteoarthritis, right knee: Secondary | ICD-10-CM | POA: Diagnosis not present

## 2020-06-29 DIAGNOSIS — D649 Anemia, unspecified: Secondary | ICD-10-CM | POA: Diagnosis not present

## 2020-06-29 DIAGNOSIS — U071 COVID-19: Secondary | ICD-10-CM | POA: Diagnosis not present

## 2020-06-29 DIAGNOSIS — R262 Difficulty in walking, not elsewhere classified: Secondary | ICD-10-CM | POA: Diagnosis not present

## 2020-06-29 DIAGNOSIS — Z20828 Contact with and (suspected) exposure to other viral communicable diseases: Secondary | ICD-10-CM | POA: Diagnosis not present

## 2020-06-29 DIAGNOSIS — W19XXXD Unspecified fall, subsequent encounter: Secondary | ICD-10-CM | POA: Diagnosis not present

## 2020-06-29 DIAGNOSIS — G8911 Acute pain due to trauma: Secondary | ICD-10-CM | POA: Diagnosis not present

## 2020-06-29 DIAGNOSIS — M25461 Effusion, right knee: Secondary | ICD-10-CM | POA: Diagnosis not present

## 2020-06-30 ENCOUNTER — Ambulatory Visit (INDEPENDENT_AMBULATORY_CARE_PROVIDER_SITE_OTHER): Payer: Medicare Other | Admitting: Orthopedic Surgery

## 2020-06-30 ENCOUNTER — Other Ambulatory Visit: Payer: Self-pay

## 2020-06-30 ENCOUNTER — Encounter: Payer: Self-pay | Admitting: Orthopedic Surgery

## 2020-06-30 VITALS — BP 140/78 | HR 72 | Ht 62.0 in | Wt 147.0 lb

## 2020-06-30 DIAGNOSIS — M1711 Unilateral primary osteoarthritis, right knee: Secondary | ICD-10-CM | POA: Diagnosis not present

## 2020-06-30 DIAGNOSIS — M1710 Unilateral primary osteoarthritis, unspecified knee: Secondary | ICD-10-CM

## 2020-06-30 NOTE — Progress Notes (Signed)
New Patient Visit  Assessment: Stephanie Vincent is a 64 y.o. female with the following: Right knee arthritis  Plan: Stephanie Vincent fell on her knee a few weeks ago.  She had some swelling and reportedly had some fluid removed from the knee, no steroid injection.  She does have some arthritis in her knee and her pain is diffuse on exam today.  It is possible that she has an injury to her meniscus, but the swelling and pain tends to improve with time.  We discussed proceeding with a steroid injection in clinic today, and she has elected to proceed.  In her facility, she should start to work with PT; recommend NSAIDs as needed for pain.  If she continues to have significant difficulty with her right knee, we can consider an MRI, although I am not convinced that she will benefit from an arthroscopy.  All questions were answered in the presence of her nursing assistant, and she is amenable to this plan.   Procedure note injection Right knee joint   Verbal consent was obtained to inject the right knee joint  Timeout was completed to confirm the site of injection.  The skin was prepped with alcohol and ethyl chloride was sprayed at the injection site.  A 21-gauge needle was used to inject 40 mg of Depo-Medrol and 1% lidocaine (3 cc) into the right knee using an anterolateral approach.  There were no complications. A sterile bandage was applied.   Follow-up: Return if symptoms worsen or fail to improve.  Subjective:  Chief Complaint  Patient presents with  . Knee Pain    Right knee pain    History of Present Illness: Stephanie Vincent is a 64 y.o. female who has been referred to clinic today by Avon Gully, MD for evaluation of right knee pain.  [facility], in the presence of her nursing assistant, after falling directly onto her right knee approximately 1 month ago.  She is unclear if she twisted her knee.  She was seen in the emergency  department, x-rays were negative, and she subsequently was evaluated at [facility].  She had some fluid removed from her knee, but did not receive steroid injection.  She continues to have pain in the right knee.  She is taking Tylenol as needed.  Prior to this, she used a walker to assist with ambulation, and she is continue to use this walker.  Chief Complaint  Patient presents with  . Knee Pain    Right knee pain    Review of Systems: No fevers or chills No numbness or tingling No shortness of breath No bowel or bladder dysfunction No GI distress   Medical History:  Past Medical History:  Diagnosis Date  . H. pylori infection 06/01/2019   Treated with amoxicillin, Biaxin, Protonix  . HTN (hypertension)   . Manic depression (HCC)   . Schizophrenia (HCC)   . Seizures (HCC)     Past Surgical History:  Procedure Laterality Date  . BIOPSY  06/01/2019   Procedure: BIOPSY;  Surgeon: West Bali, MD;  [facility];  Service: Endoscopy;;  gastric  . COLONOSCOPY  02/2009   Dr. Darrick Penna: slightly torturous colon otherwise normal   . COLONOSCOPY N/A 06/02/2019   Procedure: COLONOSCOPY;  Surgeon: Corbin Ade, MD; redundant colon, 5 mm polyp in the descending colon, otherwise normal exam.  Path with tubular adenoma.  Recommended repeat in 5 years.  Marland Kitchen  COLONOSCOPY WITH PROPOFOL N/A 06/01/2019   Procedure: COLONOSCOPY WITH PROPOFOL;  Surgeon: West Bali, MD; incomplete due to poor prep  . ESOPHAGOGASTRODUODENOSCOPY (EGD) WITH PROPOFOL N/A 06/01/2019   Procedure: ESOPHAGOGASTRODUODENOSCOPY (EGD) WITH PROPOFOL;  Surgeon: West Bali, MD;  Normal esophagus, gastritis s/p biopsy, normal duodenum.  Pathology positive for H. Pylori.  Treated with amoxicillin, Biaxin, and Protonix.   Marland Kitchen POLYPECTOMY  06/02/2019   Procedure: POLYPECTOMY;  Surgeon: Corbin Ade, MD;  [facility];  Service: Endoscopy;;  colon    Family History  Problem Relation Age of  Onset  . Colon cancer Neg Hx   . Colon polyps Neg Hx    Social History   Tobacco Use  . Smoking status: Never Smoker  . Smokeless tobacco: Never Used  Vaping Use  . Vaping Use: Never used  Substance Use Topics  . Alcohol use: No  . Drug use: No    No Known Allergies  Current Meds  Medication Sig  . clonazePAM (KLONOPIN) 1 MG tablet Take 1 tablet (1 mg total) by mouth 2 (two) times daily as needed (agitation).  Marland Kitchen divalproex (DEPAKOTE) 250 MG DR tablet Take 250 mg by mouth 2 (two) times daily.   Marland Kitchen donepezil (ARICEPT) 10 MG tablet Take 10 mg by mouth daily.   . fluvoxaMINE (LUVOX) 100 MG tablet Take 100 mg by mouth at bedtime.   Marland Kitchen lisinopril (PRINIVIL,ZESTRIL) 10 MG tablet Take 10 mg by mouth daily.   . memantine (NAMENDA) 10 MG tablet Take 10 mg by mouth 2 (two) times daily.   Marland Kitchen OLANZapine (ZYPREXA) 2.5 MG tablet Take 2.5 mg by mouth in the morning.  Marland Kitchen OLANZapine (ZYPREXA) 5 MG tablet Take 5 mg by mouth at bedtime.  . pantoprazole (PROTONIX) 40 MG tablet 1 PO BID FOR 10 DAYS THEN ONCE DAILY FOREVER  . PHENobarbital (LUMINAL) 97.2 MG tablet Take 97.2 mg by mouth at bedtime.     Objective: BP 140/78   Pulse 72   Ht 5\' 2"  (1.575 m)   Wt 147 lb (66.7 kg)   BMI 26.89 kg/m   Physical Exam:  General: Alert and oriented, no acute distress.  Answers most questions appropriately Gait: Uses a walker to assist with ambulation.  Evaluation of the right knee demonstrates a mild effusion.  She is able to achieve full extension, flexion to approximately 110 degrees.  She does have some pain at extremes of motion.  She is diffusely tender to palpation including the medial and lateral joint lines, as well as peripatellar.  Negative Lachman.  No increased laxity with varus or valgus stress.    IMAGING: I personally reviewed images previously obtained from the ED  X-rays of the right knee were obtained in the emergency department and demonstrate mild arthritis overall.  She does have  maintained joint spaces, with the presence of some osteophytes within all compartments.  No acute fracture or other bony abnormality.  New Medications:  No orders of the defined types were placed in this encounter.     , MD  06/30/2020 10:05 AM

## 2020-06-30 NOTE — Patient Instructions (Signed)

## 2020-07-03 DIAGNOSIS — R262 Difficulty in walking, not elsewhere classified: Secondary | ICD-10-CM | POA: Diagnosis not present

## 2020-07-03 DIAGNOSIS — D649 Anemia, unspecified: Secondary | ICD-10-CM | POA: Diagnosis not present

## 2020-07-03 DIAGNOSIS — W19XXXD Unspecified fall, subsequent encounter: Secondary | ICD-10-CM | POA: Diagnosis not present

## 2020-07-03 DIAGNOSIS — M1711 Unilateral primary osteoarthritis, right knee: Secondary | ICD-10-CM | POA: Diagnosis not present

## 2020-07-03 DIAGNOSIS — M25461 Effusion, right knee: Secondary | ICD-10-CM | POA: Diagnosis not present

## 2020-07-03 DIAGNOSIS — G8911 Acute pain due to trauma: Secondary | ICD-10-CM | POA: Diagnosis not present

## 2020-07-04 DIAGNOSIS — R262 Difficulty in walking, not elsewhere classified: Secondary | ICD-10-CM | POA: Diagnosis not present

## 2020-07-04 DIAGNOSIS — M25461 Effusion, right knee: Secondary | ICD-10-CM | POA: Diagnosis not present

## 2020-07-04 DIAGNOSIS — D649 Anemia, unspecified: Secondary | ICD-10-CM | POA: Diagnosis not present

## 2020-07-04 DIAGNOSIS — W19XXXD Unspecified fall, subsequent encounter: Secondary | ICD-10-CM | POA: Diagnosis not present

## 2020-07-04 DIAGNOSIS — Z20828 Contact with and (suspected) exposure to other viral communicable diseases: Secondary | ICD-10-CM | POA: Diagnosis not present

## 2020-07-04 DIAGNOSIS — U071 COVID-19: Secondary | ICD-10-CM | POA: Diagnosis not present

## 2020-07-04 DIAGNOSIS — M1711 Unilateral primary osteoarthritis, right knee: Secondary | ICD-10-CM | POA: Diagnosis not present

## 2020-07-04 DIAGNOSIS — G8911 Acute pain due to trauma: Secondary | ICD-10-CM | POA: Diagnosis not present

## 2020-07-05 DIAGNOSIS — R262 Difficulty in walking, not elsewhere classified: Secondary | ICD-10-CM | POA: Diagnosis not present

## 2020-07-05 DIAGNOSIS — D649 Anemia, unspecified: Secondary | ICD-10-CM | POA: Diagnosis not present

## 2020-07-05 DIAGNOSIS — W19XXXD Unspecified fall, subsequent encounter: Secondary | ICD-10-CM | POA: Diagnosis not present

## 2020-07-05 DIAGNOSIS — G8911 Acute pain due to trauma: Secondary | ICD-10-CM | POA: Diagnosis not present

## 2020-07-05 DIAGNOSIS — M25461 Effusion, right knee: Secondary | ICD-10-CM | POA: Diagnosis not present

## 2020-07-05 DIAGNOSIS — M1711 Unilateral primary osteoarthritis, right knee: Secondary | ICD-10-CM | POA: Diagnosis not present

## 2020-07-06 DIAGNOSIS — M1711 Unilateral primary osteoarthritis, right knee: Secondary | ICD-10-CM | POA: Diagnosis not present

## 2020-07-06 DIAGNOSIS — Z20828 Contact with and (suspected) exposure to other viral communicable diseases: Secondary | ICD-10-CM | POA: Diagnosis not present

## 2020-07-06 DIAGNOSIS — G8911 Acute pain due to trauma: Secondary | ICD-10-CM | POA: Diagnosis not present

## 2020-07-06 DIAGNOSIS — W19XXXD Unspecified fall, subsequent encounter: Secondary | ICD-10-CM | POA: Diagnosis not present

## 2020-07-06 DIAGNOSIS — R262 Difficulty in walking, not elsewhere classified: Secondary | ICD-10-CM | POA: Diagnosis not present

## 2020-07-06 DIAGNOSIS — U071 COVID-19: Secondary | ICD-10-CM | POA: Diagnosis not present

## 2020-07-06 DIAGNOSIS — M25461 Effusion, right knee: Secondary | ICD-10-CM | POA: Diagnosis not present

## 2020-07-06 DIAGNOSIS — D649 Anemia, unspecified: Secondary | ICD-10-CM | POA: Diagnosis not present

## 2020-07-09 DIAGNOSIS — G8911 Acute pain due to trauma: Secondary | ICD-10-CM | POA: Diagnosis not present

## 2020-07-09 DIAGNOSIS — R634 Abnormal weight loss: Secondary | ICD-10-CM | POA: Diagnosis not present

## 2020-07-09 DIAGNOSIS — M6281 Muscle weakness (generalized): Secondary | ICD-10-CM | POA: Diagnosis not present

## 2020-07-09 DIAGNOSIS — I10 Essential (primary) hypertension: Secondary | ICD-10-CM | POA: Diagnosis not present

## 2020-07-09 DIAGNOSIS — F209 Schizophrenia, unspecified: Secondary | ICD-10-CM | POA: Diagnosis not present

## 2020-07-09 DIAGNOSIS — G40909 Epilepsy, unspecified, not intractable, without status epilepticus: Secondary | ICD-10-CM | POA: Diagnosis not present

## 2020-07-09 DIAGNOSIS — R262 Difficulty in walking, not elsewhere classified: Secondary | ICD-10-CM | POA: Diagnosis not present

## 2020-07-09 DIAGNOSIS — M25461 Effusion, right knee: Secondary | ICD-10-CM | POA: Diagnosis not present

## 2020-07-09 DIAGNOSIS — W19XXXD Unspecified fall, subsequent encounter: Secondary | ICD-10-CM | POA: Diagnosis not present

## 2020-07-09 DIAGNOSIS — M1711 Unilateral primary osteoarthritis, right knee: Secondary | ICD-10-CM | POA: Diagnosis not present

## 2020-07-09 DIAGNOSIS — D649 Anemia, unspecified: Secondary | ICD-10-CM | POA: Diagnosis not present

## 2020-07-10 DIAGNOSIS — G8911 Acute pain due to trauma: Secondary | ICD-10-CM | POA: Diagnosis not present

## 2020-07-10 DIAGNOSIS — W19XXXD Unspecified fall, subsequent encounter: Secondary | ICD-10-CM | POA: Diagnosis not present

## 2020-07-10 DIAGNOSIS — R262 Difficulty in walking, not elsewhere classified: Secondary | ICD-10-CM | POA: Diagnosis not present

## 2020-07-10 DIAGNOSIS — M25461 Effusion, right knee: Secondary | ICD-10-CM | POA: Diagnosis not present

## 2020-07-10 DIAGNOSIS — D649 Anemia, unspecified: Secondary | ICD-10-CM | POA: Diagnosis not present

## 2020-07-10 DIAGNOSIS — M1711 Unilateral primary osteoarthritis, right knee: Secondary | ICD-10-CM | POA: Diagnosis not present

## 2020-07-11 DIAGNOSIS — D649 Anemia, unspecified: Secondary | ICD-10-CM | POA: Diagnosis not present

## 2020-07-11 DIAGNOSIS — M25461 Effusion, right knee: Secondary | ICD-10-CM | POA: Diagnosis not present

## 2020-07-11 DIAGNOSIS — U071 COVID-19: Secondary | ICD-10-CM | POA: Diagnosis not present

## 2020-07-11 DIAGNOSIS — Z20828 Contact with and (suspected) exposure to other viral communicable diseases: Secondary | ICD-10-CM | POA: Diagnosis not present

## 2020-07-11 DIAGNOSIS — M1711 Unilateral primary osteoarthritis, right knee: Secondary | ICD-10-CM | POA: Diagnosis not present

## 2020-07-11 DIAGNOSIS — W19XXXD Unspecified fall, subsequent encounter: Secondary | ICD-10-CM | POA: Diagnosis not present

## 2020-07-11 DIAGNOSIS — R262 Difficulty in walking, not elsewhere classified: Secondary | ICD-10-CM | POA: Diagnosis not present

## 2020-07-11 DIAGNOSIS — G8911 Acute pain due to trauma: Secondary | ICD-10-CM | POA: Diagnosis not present

## 2020-07-14 DIAGNOSIS — M1711 Unilateral primary osteoarthritis, right knee: Secondary | ICD-10-CM | POA: Diagnosis not present

## 2020-07-14 DIAGNOSIS — M25461 Effusion, right knee: Secondary | ICD-10-CM | POA: Diagnosis not present

## 2020-07-14 DIAGNOSIS — R262 Difficulty in walking, not elsewhere classified: Secondary | ICD-10-CM | POA: Diagnosis not present

## 2020-07-14 DIAGNOSIS — W19XXXD Unspecified fall, subsequent encounter: Secondary | ICD-10-CM | POA: Diagnosis not present

## 2020-07-14 DIAGNOSIS — D649 Anemia, unspecified: Secondary | ICD-10-CM | POA: Diagnosis not present

## 2020-07-14 DIAGNOSIS — G8911 Acute pain due to trauma: Secondary | ICD-10-CM | POA: Diagnosis not present

## 2020-07-17 DIAGNOSIS — D649 Anemia, unspecified: Secondary | ICD-10-CM | POA: Diagnosis not present

## 2020-07-17 DIAGNOSIS — M25461 Effusion, right knee: Secondary | ICD-10-CM | POA: Diagnosis not present

## 2020-07-17 DIAGNOSIS — G8911 Acute pain due to trauma: Secondary | ICD-10-CM | POA: Diagnosis not present

## 2020-07-17 DIAGNOSIS — M1711 Unilateral primary osteoarthritis, right knee: Secondary | ICD-10-CM | POA: Diagnosis not present

## 2020-07-17 DIAGNOSIS — W19XXXD Unspecified fall, subsequent encounter: Secondary | ICD-10-CM | POA: Diagnosis not present

## 2020-07-17 DIAGNOSIS — R262 Difficulty in walking, not elsewhere classified: Secondary | ICD-10-CM | POA: Diagnosis not present

## 2020-07-18 DIAGNOSIS — M1711 Unilateral primary osteoarthritis, right knee: Secondary | ICD-10-CM | POA: Diagnosis not present

## 2020-07-18 DIAGNOSIS — M25461 Effusion, right knee: Secondary | ICD-10-CM | POA: Diagnosis not present

## 2020-07-18 DIAGNOSIS — D649 Anemia, unspecified: Secondary | ICD-10-CM | POA: Diagnosis not present

## 2020-07-18 DIAGNOSIS — Z20828 Contact with and (suspected) exposure to other viral communicable diseases: Secondary | ICD-10-CM | POA: Diagnosis not present

## 2020-07-18 DIAGNOSIS — R262 Difficulty in walking, not elsewhere classified: Secondary | ICD-10-CM | POA: Diagnosis not present

## 2020-07-18 DIAGNOSIS — G8911 Acute pain due to trauma: Secondary | ICD-10-CM | POA: Diagnosis not present

## 2020-07-18 DIAGNOSIS — W19XXXD Unspecified fall, subsequent encounter: Secondary | ICD-10-CM | POA: Diagnosis not present

## 2020-07-18 DIAGNOSIS — U071 COVID-19: Secondary | ICD-10-CM | POA: Diagnosis not present

## 2020-07-19 DIAGNOSIS — W19XXXD Unspecified fall, subsequent encounter: Secondary | ICD-10-CM | POA: Diagnosis not present

## 2020-07-19 DIAGNOSIS — M1711 Unilateral primary osteoarthritis, right knee: Secondary | ICD-10-CM | POA: Diagnosis not present

## 2020-07-19 DIAGNOSIS — G8911 Acute pain due to trauma: Secondary | ICD-10-CM | POA: Diagnosis not present

## 2020-07-19 DIAGNOSIS — R262 Difficulty in walking, not elsewhere classified: Secondary | ICD-10-CM | POA: Diagnosis not present

## 2020-07-19 DIAGNOSIS — M25461 Effusion, right knee: Secondary | ICD-10-CM | POA: Diagnosis not present

## 2020-07-19 DIAGNOSIS — D649 Anemia, unspecified: Secondary | ICD-10-CM | POA: Diagnosis not present

## 2020-07-20 DIAGNOSIS — G8911 Acute pain due to trauma: Secondary | ICD-10-CM | POA: Diagnosis not present

## 2020-07-20 DIAGNOSIS — Z20828 Contact with and (suspected) exposure to other viral communicable diseases: Secondary | ICD-10-CM | POA: Diagnosis not present

## 2020-07-20 DIAGNOSIS — D649 Anemia, unspecified: Secondary | ICD-10-CM | POA: Diagnosis not present

## 2020-07-20 DIAGNOSIS — M1711 Unilateral primary osteoarthritis, right knee: Secondary | ICD-10-CM | POA: Diagnosis not present

## 2020-07-20 DIAGNOSIS — M25461 Effusion, right knee: Secondary | ICD-10-CM | POA: Diagnosis not present

## 2020-07-20 DIAGNOSIS — W19XXXD Unspecified fall, subsequent encounter: Secondary | ICD-10-CM | POA: Diagnosis not present

## 2020-07-20 DIAGNOSIS — R262 Difficulty in walking, not elsewhere classified: Secondary | ICD-10-CM | POA: Diagnosis not present

## 2020-07-20 DIAGNOSIS — U071 COVID-19: Secondary | ICD-10-CM | POA: Diagnosis not present

## 2020-07-24 DIAGNOSIS — D649 Anemia, unspecified: Secondary | ICD-10-CM | POA: Diagnosis not present

## 2020-07-24 DIAGNOSIS — R262 Difficulty in walking, not elsewhere classified: Secondary | ICD-10-CM | POA: Diagnosis not present

## 2020-07-24 DIAGNOSIS — G8911 Acute pain due to trauma: Secondary | ICD-10-CM | POA: Diagnosis not present

## 2020-07-24 DIAGNOSIS — M25461 Effusion, right knee: Secondary | ICD-10-CM | POA: Diagnosis not present

## 2020-07-24 DIAGNOSIS — W19XXXD Unspecified fall, subsequent encounter: Secondary | ICD-10-CM | POA: Diagnosis not present

## 2020-07-24 DIAGNOSIS — M1711 Unilateral primary osteoarthritis, right knee: Secondary | ICD-10-CM | POA: Diagnosis not present

## 2020-07-25 DIAGNOSIS — M1711 Unilateral primary osteoarthritis, right knee: Secondary | ICD-10-CM | POA: Diagnosis not present

## 2020-07-25 DIAGNOSIS — Z20828 Contact with and (suspected) exposure to other viral communicable diseases: Secondary | ICD-10-CM | POA: Diagnosis not present

## 2020-07-25 DIAGNOSIS — R262 Difficulty in walking, not elsewhere classified: Secondary | ICD-10-CM | POA: Diagnosis not present

## 2020-07-25 DIAGNOSIS — U071 COVID-19: Secondary | ICD-10-CM | POA: Diagnosis not present

## 2020-07-25 DIAGNOSIS — D649 Anemia, unspecified: Secondary | ICD-10-CM | POA: Diagnosis not present

## 2020-07-25 DIAGNOSIS — G8911 Acute pain due to trauma: Secondary | ICD-10-CM | POA: Diagnosis not present

## 2020-07-25 DIAGNOSIS — M25461 Effusion, right knee: Secondary | ICD-10-CM | POA: Diagnosis not present

## 2020-07-25 DIAGNOSIS — W19XXXD Unspecified fall, subsequent encounter: Secondary | ICD-10-CM | POA: Diagnosis not present

## 2020-07-26 DIAGNOSIS — D649 Anemia, unspecified: Secondary | ICD-10-CM | POA: Diagnosis not present

## 2020-07-26 DIAGNOSIS — M25461 Effusion, right knee: Secondary | ICD-10-CM | POA: Diagnosis not present

## 2020-07-26 DIAGNOSIS — W19XXXD Unspecified fall, subsequent encounter: Secondary | ICD-10-CM | POA: Diagnosis not present

## 2020-07-26 DIAGNOSIS — M1711 Unilateral primary osteoarthritis, right knee: Secondary | ICD-10-CM | POA: Diagnosis not present

## 2020-07-26 DIAGNOSIS — R262 Difficulty in walking, not elsewhere classified: Secondary | ICD-10-CM | POA: Diagnosis not present

## 2020-07-26 DIAGNOSIS — G8911 Acute pain due to trauma: Secondary | ICD-10-CM | POA: Diagnosis not present

## 2020-07-27 DIAGNOSIS — M1711 Unilateral primary osteoarthritis, right knee: Secondary | ICD-10-CM | POA: Diagnosis not present

## 2020-07-27 DIAGNOSIS — D649 Anemia, unspecified: Secondary | ICD-10-CM | POA: Diagnosis not present

## 2020-07-27 DIAGNOSIS — G8911 Acute pain due to trauma: Secondary | ICD-10-CM | POA: Diagnosis not present

## 2020-07-27 DIAGNOSIS — M25461 Effusion, right knee: Secondary | ICD-10-CM | POA: Diagnosis not present

## 2020-07-27 DIAGNOSIS — Z20828 Contact with and (suspected) exposure to other viral communicable diseases: Secondary | ICD-10-CM | POA: Diagnosis not present

## 2020-07-27 DIAGNOSIS — U071 COVID-19: Secondary | ICD-10-CM | POA: Diagnosis not present

## 2020-07-27 DIAGNOSIS — R262 Difficulty in walking, not elsewhere classified: Secondary | ICD-10-CM | POA: Diagnosis not present

## 2020-07-27 DIAGNOSIS — W19XXXD Unspecified fall, subsequent encounter: Secondary | ICD-10-CM | POA: Diagnosis not present

## 2020-07-31 DIAGNOSIS — G8911 Acute pain due to trauma: Secondary | ICD-10-CM | POA: Diagnosis not present

## 2020-07-31 DIAGNOSIS — D649 Anemia, unspecified: Secondary | ICD-10-CM | POA: Diagnosis not present

## 2020-07-31 DIAGNOSIS — W19XXXD Unspecified fall, subsequent encounter: Secondary | ICD-10-CM | POA: Diagnosis not present

## 2020-07-31 DIAGNOSIS — M25461 Effusion, right knee: Secondary | ICD-10-CM | POA: Diagnosis not present

## 2020-07-31 DIAGNOSIS — R262 Difficulty in walking, not elsewhere classified: Secondary | ICD-10-CM | POA: Diagnosis not present

## 2020-07-31 DIAGNOSIS — M1711 Unilateral primary osteoarthritis, right knee: Secondary | ICD-10-CM | POA: Diagnosis not present

## 2020-08-01 DIAGNOSIS — Z20828 Contact with and (suspected) exposure to other viral communicable diseases: Secondary | ICD-10-CM | POA: Diagnosis not present

## 2020-08-01 DIAGNOSIS — M1711 Unilateral primary osteoarthritis, right knee: Secondary | ICD-10-CM | POA: Diagnosis not present

## 2020-08-01 DIAGNOSIS — R262 Difficulty in walking, not elsewhere classified: Secondary | ICD-10-CM | POA: Diagnosis not present

## 2020-08-01 DIAGNOSIS — D649 Anemia, unspecified: Secondary | ICD-10-CM | POA: Diagnosis not present

## 2020-08-01 DIAGNOSIS — G8911 Acute pain due to trauma: Secondary | ICD-10-CM | POA: Diagnosis not present

## 2020-08-01 DIAGNOSIS — W19XXXD Unspecified fall, subsequent encounter: Secondary | ICD-10-CM | POA: Diagnosis not present

## 2020-08-01 DIAGNOSIS — U071 COVID-19: Secondary | ICD-10-CM | POA: Diagnosis not present

## 2020-08-01 DIAGNOSIS — M25461 Effusion, right knee: Secondary | ICD-10-CM | POA: Diagnosis not present

## 2020-08-02 DIAGNOSIS — M25461 Effusion, right knee: Secondary | ICD-10-CM | POA: Diagnosis not present

## 2020-08-02 DIAGNOSIS — D649 Anemia, unspecified: Secondary | ICD-10-CM | POA: Diagnosis not present

## 2020-08-02 DIAGNOSIS — G8911 Acute pain due to trauma: Secondary | ICD-10-CM | POA: Diagnosis not present

## 2020-08-02 DIAGNOSIS — R262 Difficulty in walking, not elsewhere classified: Secondary | ICD-10-CM | POA: Diagnosis not present

## 2020-08-02 DIAGNOSIS — W19XXXD Unspecified fall, subsequent encounter: Secondary | ICD-10-CM | POA: Diagnosis not present

## 2020-08-02 DIAGNOSIS — M1711 Unilateral primary osteoarthritis, right knee: Secondary | ICD-10-CM | POA: Diagnosis not present

## 2020-08-03 DIAGNOSIS — M25461 Effusion, right knee: Secondary | ICD-10-CM | POA: Diagnosis not present

## 2020-08-03 DIAGNOSIS — W19XXXD Unspecified fall, subsequent encounter: Secondary | ICD-10-CM | POA: Diagnosis not present

## 2020-08-03 DIAGNOSIS — U071 COVID-19: Secondary | ICD-10-CM | POA: Diagnosis not present

## 2020-08-03 DIAGNOSIS — M1711 Unilateral primary osteoarthritis, right knee: Secondary | ICD-10-CM | POA: Diagnosis not present

## 2020-08-03 DIAGNOSIS — G8911 Acute pain due to trauma: Secondary | ICD-10-CM | POA: Diagnosis not present

## 2020-08-03 DIAGNOSIS — R262 Difficulty in walking, not elsewhere classified: Secondary | ICD-10-CM | POA: Diagnosis not present

## 2020-08-03 DIAGNOSIS — Z20828 Contact with and (suspected) exposure to other viral communicable diseases: Secondary | ICD-10-CM | POA: Diagnosis not present

## 2020-08-03 DIAGNOSIS — D649 Anemia, unspecified: Secondary | ICD-10-CM | POA: Diagnosis not present

## 2020-08-06 DIAGNOSIS — W19XXXD Unspecified fall, subsequent encounter: Secondary | ICD-10-CM | POA: Diagnosis not present

## 2020-08-06 DIAGNOSIS — D649 Anemia, unspecified: Secondary | ICD-10-CM | POA: Diagnosis not present

## 2020-08-06 DIAGNOSIS — R262 Difficulty in walking, not elsewhere classified: Secondary | ICD-10-CM | POA: Diagnosis not present

## 2020-08-06 DIAGNOSIS — M25461 Effusion, right knee: Secondary | ICD-10-CM | POA: Diagnosis not present

## 2020-08-06 DIAGNOSIS — M1711 Unilateral primary osteoarthritis, right knee: Secondary | ICD-10-CM | POA: Diagnosis not present

## 2020-08-06 DIAGNOSIS — G8911 Acute pain due to trauma: Secondary | ICD-10-CM | POA: Diagnosis not present

## 2020-08-07 DIAGNOSIS — F209 Schizophrenia, unspecified: Secondary | ICD-10-CM | POA: Diagnosis not present

## 2020-08-07 DIAGNOSIS — I10 Essential (primary) hypertension: Secondary | ICD-10-CM | POA: Diagnosis not present

## 2020-08-07 DIAGNOSIS — Z1389 Encounter for screening for other disorder: Secondary | ICD-10-CM | POA: Diagnosis not present

## 2020-08-07 DIAGNOSIS — F0390 Unspecified dementia without behavioral disturbance: Secondary | ICD-10-CM | POA: Diagnosis not present

## 2020-08-07 DIAGNOSIS — G40909 Epilepsy, unspecified, not intractable, without status epilepticus: Secondary | ICD-10-CM | POA: Diagnosis not present

## 2020-08-08 DIAGNOSIS — R262 Difficulty in walking, not elsewhere classified: Secondary | ICD-10-CM | POA: Diagnosis not present

## 2020-08-08 DIAGNOSIS — M25561 Pain in right knee: Secondary | ICD-10-CM | POA: Diagnosis not present

## 2020-08-08 DIAGNOSIS — Z20828 Contact with and (suspected) exposure to other viral communicable diseases: Secondary | ICD-10-CM | POA: Diagnosis not present

## 2020-08-08 DIAGNOSIS — G40909 Epilepsy, unspecified, not intractable, without status epilepticus: Secondary | ICD-10-CM | POA: Diagnosis not present

## 2020-08-08 DIAGNOSIS — M6281 Muscle weakness (generalized): Secondary | ICD-10-CM | POA: Diagnosis not present

## 2020-08-08 DIAGNOSIS — M25511 Pain in right shoulder: Secondary | ICD-10-CM | POA: Diagnosis not present

## 2020-08-08 DIAGNOSIS — R634 Abnormal weight loss: Secondary | ICD-10-CM | POA: Diagnosis not present

## 2020-08-08 DIAGNOSIS — U071 COVID-19: Secondary | ICD-10-CM | POA: Diagnosis not present

## 2020-08-08 DIAGNOSIS — D649 Anemia, unspecified: Secondary | ICD-10-CM | POA: Diagnosis not present

## 2020-08-08 DIAGNOSIS — W19XXXD Unspecified fall, subsequent encounter: Secondary | ICD-10-CM | POA: Diagnosis not present

## 2020-08-08 DIAGNOSIS — F209 Schizophrenia, unspecified: Secondary | ICD-10-CM | POA: Diagnosis not present

## 2020-08-08 DIAGNOSIS — I10 Essential (primary) hypertension: Secondary | ICD-10-CM | POA: Diagnosis not present

## 2020-08-08 DIAGNOSIS — M1711 Unilateral primary osteoarthritis, right knee: Secondary | ICD-10-CM | POA: Diagnosis not present

## 2020-08-09 DIAGNOSIS — M1711 Unilateral primary osteoarthritis, right knee: Secondary | ICD-10-CM | POA: Diagnosis not present

## 2020-08-09 DIAGNOSIS — D649 Anemia, unspecified: Secondary | ICD-10-CM | POA: Diagnosis not present

## 2020-08-09 DIAGNOSIS — F209 Schizophrenia, unspecified: Secondary | ICD-10-CM | POA: Diagnosis not present

## 2020-08-09 DIAGNOSIS — M25561 Pain in right knee: Secondary | ICD-10-CM | POA: Diagnosis not present

## 2020-08-09 DIAGNOSIS — R262 Difficulty in walking, not elsewhere classified: Secondary | ICD-10-CM | POA: Diagnosis not present

## 2020-08-09 DIAGNOSIS — M25511 Pain in right shoulder: Secondary | ICD-10-CM | POA: Diagnosis not present

## 2020-08-10 DIAGNOSIS — U071 COVID-19: Secondary | ICD-10-CM | POA: Diagnosis not present

## 2020-08-10 DIAGNOSIS — Z20828 Contact with and (suspected) exposure to other viral communicable diseases: Secondary | ICD-10-CM | POA: Diagnosis not present

## 2020-08-15 DIAGNOSIS — Z20828 Contact with and (suspected) exposure to other viral communicable diseases: Secondary | ICD-10-CM | POA: Diagnosis not present

## 2020-08-15 DIAGNOSIS — U071 COVID-19: Secondary | ICD-10-CM | POA: Diagnosis not present

## 2020-08-16 DIAGNOSIS — Z0001 Encounter for general adult medical examination with abnormal findings: Secondary | ICD-10-CM | POA: Diagnosis not present

## 2020-08-16 DIAGNOSIS — Z79899 Other long term (current) drug therapy: Secondary | ICD-10-CM | POA: Diagnosis not present

## 2020-08-16 DIAGNOSIS — I10 Essential (primary) hypertension: Secondary | ICD-10-CM | POA: Diagnosis not present

## 2020-08-17 DIAGNOSIS — U071 COVID-19: Secondary | ICD-10-CM | POA: Diagnosis not present

## 2020-08-17 DIAGNOSIS — Z20828 Contact with and (suspected) exposure to other viral communicable diseases: Secondary | ICD-10-CM | POA: Diagnosis not present

## 2020-08-22 DIAGNOSIS — M1711 Unilateral primary osteoarthritis, right knee: Secondary | ICD-10-CM | POA: Diagnosis not present

## 2020-08-22 DIAGNOSIS — U071 COVID-19: Secondary | ICD-10-CM | POA: Diagnosis not present

## 2020-08-22 DIAGNOSIS — F209 Schizophrenia, unspecified: Secondary | ICD-10-CM | POA: Diagnosis not present

## 2020-08-22 DIAGNOSIS — Z20828 Contact with and (suspected) exposure to other viral communicable diseases: Secondary | ICD-10-CM | POA: Diagnosis not present

## 2020-08-22 DIAGNOSIS — M25561 Pain in right knee: Secondary | ICD-10-CM | POA: Diagnosis not present

## 2020-08-22 DIAGNOSIS — M25511 Pain in right shoulder: Secondary | ICD-10-CM | POA: Diagnosis not present

## 2020-08-22 DIAGNOSIS — D649 Anemia, unspecified: Secondary | ICD-10-CM | POA: Diagnosis not present

## 2020-08-22 DIAGNOSIS — R262 Difficulty in walking, not elsewhere classified: Secondary | ICD-10-CM | POA: Diagnosis not present

## 2020-08-23 DIAGNOSIS — F200 Paranoid schizophrenia: Secondary | ICD-10-CM | POA: Diagnosis not present

## 2020-08-24 DIAGNOSIS — U071 COVID-19: Secondary | ICD-10-CM | POA: Diagnosis not present

## 2020-08-24 DIAGNOSIS — M1711 Unilateral primary osteoarthritis, right knee: Secondary | ICD-10-CM | POA: Diagnosis not present

## 2020-08-24 DIAGNOSIS — D649 Anemia, unspecified: Secondary | ICD-10-CM | POA: Diagnosis not present

## 2020-08-24 DIAGNOSIS — M25511 Pain in right shoulder: Secondary | ICD-10-CM | POA: Diagnosis not present

## 2020-08-24 DIAGNOSIS — M25561 Pain in right knee: Secondary | ICD-10-CM | POA: Diagnosis not present

## 2020-08-24 DIAGNOSIS — R262 Difficulty in walking, not elsewhere classified: Secondary | ICD-10-CM | POA: Diagnosis not present

## 2020-08-24 DIAGNOSIS — Z20828 Contact with and (suspected) exposure to other viral communicable diseases: Secondary | ICD-10-CM | POA: Diagnosis not present

## 2020-08-24 DIAGNOSIS — F209 Schizophrenia, unspecified: Secondary | ICD-10-CM | POA: Diagnosis not present

## 2020-08-25 DIAGNOSIS — D649 Anemia, unspecified: Secondary | ICD-10-CM | POA: Diagnosis not present

## 2020-08-25 DIAGNOSIS — M25561 Pain in right knee: Secondary | ICD-10-CM | POA: Diagnosis not present

## 2020-08-25 DIAGNOSIS — R262 Difficulty in walking, not elsewhere classified: Secondary | ICD-10-CM | POA: Diagnosis not present

## 2020-08-25 DIAGNOSIS — M25511 Pain in right shoulder: Secondary | ICD-10-CM | POA: Diagnosis not present

## 2020-08-25 DIAGNOSIS — F209 Schizophrenia, unspecified: Secondary | ICD-10-CM | POA: Diagnosis not present

## 2020-08-25 DIAGNOSIS — M1711 Unilateral primary osteoarthritis, right knee: Secondary | ICD-10-CM | POA: Diagnosis not present

## 2020-08-28 DIAGNOSIS — M1711 Unilateral primary osteoarthritis, right knee: Secondary | ICD-10-CM | POA: Diagnosis not present

## 2020-08-28 DIAGNOSIS — R262 Difficulty in walking, not elsewhere classified: Secondary | ICD-10-CM | POA: Diagnosis not present

## 2020-08-28 DIAGNOSIS — F209 Schizophrenia, unspecified: Secondary | ICD-10-CM | POA: Diagnosis not present

## 2020-08-28 DIAGNOSIS — M25561 Pain in right knee: Secondary | ICD-10-CM | POA: Diagnosis not present

## 2020-08-28 DIAGNOSIS — M25511 Pain in right shoulder: Secondary | ICD-10-CM | POA: Diagnosis not present

## 2020-08-28 DIAGNOSIS — D649 Anemia, unspecified: Secondary | ICD-10-CM | POA: Diagnosis not present

## 2020-08-29 DIAGNOSIS — U071 COVID-19: Secondary | ICD-10-CM | POA: Diagnosis not present

## 2020-08-29 DIAGNOSIS — Z20828 Contact with and (suspected) exposure to other viral communicable diseases: Secondary | ICD-10-CM | POA: Diagnosis not present

## 2020-08-29 DIAGNOSIS — R262 Difficulty in walking, not elsewhere classified: Secondary | ICD-10-CM | POA: Diagnosis not present

## 2020-08-29 DIAGNOSIS — M25511 Pain in right shoulder: Secondary | ICD-10-CM | POA: Diagnosis not present

## 2020-08-29 DIAGNOSIS — M25561 Pain in right knee: Secondary | ICD-10-CM | POA: Diagnosis not present

## 2020-08-29 DIAGNOSIS — F209 Schizophrenia, unspecified: Secondary | ICD-10-CM | POA: Diagnosis not present

## 2020-08-29 DIAGNOSIS — M1711 Unilateral primary osteoarthritis, right knee: Secondary | ICD-10-CM | POA: Diagnosis not present

## 2020-08-29 DIAGNOSIS — D649 Anemia, unspecified: Secondary | ICD-10-CM | POA: Diagnosis not present

## 2020-08-30 DIAGNOSIS — F209 Schizophrenia, unspecified: Secondary | ICD-10-CM | POA: Diagnosis not present

## 2020-08-30 DIAGNOSIS — R262 Difficulty in walking, not elsewhere classified: Secondary | ICD-10-CM | POA: Diagnosis not present

## 2020-08-30 DIAGNOSIS — M25511 Pain in right shoulder: Secondary | ICD-10-CM | POA: Diagnosis not present

## 2020-08-30 DIAGNOSIS — M1711 Unilateral primary osteoarthritis, right knee: Secondary | ICD-10-CM | POA: Diagnosis not present

## 2020-08-30 DIAGNOSIS — D649 Anemia, unspecified: Secondary | ICD-10-CM | POA: Diagnosis not present

## 2020-08-30 DIAGNOSIS — M25561 Pain in right knee: Secondary | ICD-10-CM | POA: Diagnosis not present

## 2020-08-31 DIAGNOSIS — D649 Anemia, unspecified: Secondary | ICD-10-CM | POA: Diagnosis not present

## 2020-08-31 DIAGNOSIS — Z20828 Contact with and (suspected) exposure to other viral communicable diseases: Secondary | ICD-10-CM | POA: Diagnosis not present

## 2020-08-31 DIAGNOSIS — U071 COVID-19: Secondary | ICD-10-CM | POA: Diagnosis not present

## 2020-08-31 DIAGNOSIS — M1711 Unilateral primary osteoarthritis, right knee: Secondary | ICD-10-CM | POA: Diagnosis not present

## 2020-08-31 DIAGNOSIS — M25561 Pain in right knee: Secondary | ICD-10-CM | POA: Diagnosis not present

## 2020-08-31 DIAGNOSIS — M25511 Pain in right shoulder: Secondary | ICD-10-CM | POA: Diagnosis not present

## 2020-08-31 DIAGNOSIS — R262 Difficulty in walking, not elsewhere classified: Secondary | ICD-10-CM | POA: Diagnosis not present

## 2020-08-31 DIAGNOSIS — F209 Schizophrenia, unspecified: Secondary | ICD-10-CM | POA: Diagnosis not present

## 2020-09-05 DIAGNOSIS — M25561 Pain in right knee: Secondary | ICD-10-CM | POA: Diagnosis not present

## 2020-09-05 DIAGNOSIS — Z20828 Contact with and (suspected) exposure to other viral communicable diseases: Secondary | ICD-10-CM | POA: Diagnosis not present

## 2020-09-05 DIAGNOSIS — D649 Anemia, unspecified: Secondary | ICD-10-CM | POA: Diagnosis not present

## 2020-09-05 DIAGNOSIS — F209 Schizophrenia, unspecified: Secondary | ICD-10-CM | POA: Diagnosis not present

## 2020-09-05 DIAGNOSIS — M25511 Pain in right shoulder: Secondary | ICD-10-CM | POA: Diagnosis not present

## 2020-09-05 DIAGNOSIS — M1711 Unilateral primary osteoarthritis, right knee: Secondary | ICD-10-CM | POA: Diagnosis not present

## 2020-09-05 DIAGNOSIS — U071 COVID-19: Secondary | ICD-10-CM | POA: Diagnosis not present

## 2020-09-05 DIAGNOSIS — R262 Difficulty in walking, not elsewhere classified: Secondary | ICD-10-CM | POA: Diagnosis not present

## 2020-09-06 DIAGNOSIS — M25561 Pain in right knee: Secondary | ICD-10-CM | POA: Diagnosis not present

## 2020-09-06 DIAGNOSIS — M1711 Unilateral primary osteoarthritis, right knee: Secondary | ICD-10-CM | POA: Diagnosis not present

## 2020-09-06 DIAGNOSIS — R262 Difficulty in walking, not elsewhere classified: Secondary | ICD-10-CM | POA: Diagnosis not present

## 2020-09-06 DIAGNOSIS — M25511 Pain in right shoulder: Secondary | ICD-10-CM | POA: Diagnosis not present

## 2020-09-06 DIAGNOSIS — D649 Anemia, unspecified: Secondary | ICD-10-CM | POA: Diagnosis not present

## 2020-09-06 DIAGNOSIS — F209 Schizophrenia, unspecified: Secondary | ICD-10-CM | POA: Diagnosis not present

## 2020-09-07 DIAGNOSIS — I10 Essential (primary) hypertension: Secondary | ICD-10-CM | POA: Diagnosis not present

## 2020-09-07 DIAGNOSIS — W19XXXD Unspecified fall, subsequent encounter: Secondary | ICD-10-CM | POA: Diagnosis not present

## 2020-09-07 DIAGNOSIS — F209 Schizophrenia, unspecified: Secondary | ICD-10-CM | POA: Diagnosis not present

## 2020-09-07 DIAGNOSIS — Z20828 Contact with and (suspected) exposure to other viral communicable diseases: Secondary | ICD-10-CM | POA: Diagnosis not present

## 2020-09-07 DIAGNOSIS — D649 Anemia, unspecified: Secondary | ICD-10-CM | POA: Diagnosis not present

## 2020-09-07 DIAGNOSIS — M1711 Unilateral primary osteoarthritis, right knee: Secondary | ICD-10-CM | POA: Diagnosis not present

## 2020-09-07 DIAGNOSIS — M6281 Muscle weakness (generalized): Secondary | ICD-10-CM | POA: Diagnosis not present

## 2020-09-07 DIAGNOSIS — M25561 Pain in right knee: Secondary | ICD-10-CM | POA: Diagnosis not present

## 2020-09-07 DIAGNOSIS — R262 Difficulty in walking, not elsewhere classified: Secondary | ICD-10-CM | POA: Diagnosis not present

## 2020-09-07 DIAGNOSIS — G40909 Epilepsy, unspecified, not intractable, without status epilepticus: Secondary | ICD-10-CM | POA: Diagnosis not present

## 2020-09-07 DIAGNOSIS — R634 Abnormal weight loss: Secondary | ICD-10-CM | POA: Diagnosis not present

## 2020-09-07 DIAGNOSIS — U071 COVID-19: Secondary | ICD-10-CM | POA: Diagnosis not present

## 2020-09-07 DIAGNOSIS — M25511 Pain in right shoulder: Secondary | ICD-10-CM | POA: Diagnosis not present

## 2020-09-08 DIAGNOSIS — R262 Difficulty in walking, not elsewhere classified: Secondary | ICD-10-CM | POA: Diagnosis not present

## 2020-09-08 DIAGNOSIS — M25511 Pain in right shoulder: Secondary | ICD-10-CM | POA: Diagnosis not present

## 2020-09-08 DIAGNOSIS — D649 Anemia, unspecified: Secondary | ICD-10-CM | POA: Diagnosis not present

## 2020-09-08 DIAGNOSIS — M25561 Pain in right knee: Secondary | ICD-10-CM | POA: Diagnosis not present

## 2020-09-08 DIAGNOSIS — F209 Schizophrenia, unspecified: Secondary | ICD-10-CM | POA: Diagnosis not present

## 2020-09-08 DIAGNOSIS — M1711 Unilateral primary osteoarthritis, right knee: Secondary | ICD-10-CM | POA: Diagnosis not present

## 2020-09-11 DIAGNOSIS — M25561 Pain in right knee: Secondary | ICD-10-CM | POA: Diagnosis not present

## 2020-09-11 DIAGNOSIS — F209 Schizophrenia, unspecified: Secondary | ICD-10-CM | POA: Diagnosis not present

## 2020-09-11 DIAGNOSIS — M25511 Pain in right shoulder: Secondary | ICD-10-CM | POA: Diagnosis not present

## 2020-09-11 DIAGNOSIS — M1711 Unilateral primary osteoarthritis, right knee: Secondary | ICD-10-CM | POA: Diagnosis not present

## 2020-09-11 DIAGNOSIS — R262 Difficulty in walking, not elsewhere classified: Secondary | ICD-10-CM | POA: Diagnosis not present

## 2020-09-11 DIAGNOSIS — D649 Anemia, unspecified: Secondary | ICD-10-CM | POA: Diagnosis not present

## 2020-09-12 DIAGNOSIS — M25561 Pain in right knee: Secondary | ICD-10-CM | POA: Diagnosis not present

## 2020-09-12 DIAGNOSIS — M1711 Unilateral primary osteoarthritis, right knee: Secondary | ICD-10-CM | POA: Diagnosis not present

## 2020-09-12 DIAGNOSIS — Z20828 Contact with and (suspected) exposure to other viral communicable diseases: Secondary | ICD-10-CM | POA: Diagnosis not present

## 2020-09-12 DIAGNOSIS — R262 Difficulty in walking, not elsewhere classified: Secondary | ICD-10-CM | POA: Diagnosis not present

## 2020-09-12 DIAGNOSIS — F209 Schizophrenia, unspecified: Secondary | ICD-10-CM | POA: Diagnosis not present

## 2020-09-12 DIAGNOSIS — M25511 Pain in right shoulder: Secondary | ICD-10-CM | POA: Diagnosis not present

## 2020-09-12 DIAGNOSIS — U071 COVID-19: Secondary | ICD-10-CM | POA: Diagnosis not present

## 2020-09-12 DIAGNOSIS — D649 Anemia, unspecified: Secondary | ICD-10-CM | POA: Diagnosis not present

## 2020-09-13 DIAGNOSIS — F209 Schizophrenia, unspecified: Secondary | ICD-10-CM | POA: Diagnosis not present

## 2020-09-13 DIAGNOSIS — M1711 Unilateral primary osteoarthritis, right knee: Secondary | ICD-10-CM | POA: Diagnosis not present

## 2020-09-13 DIAGNOSIS — D649 Anemia, unspecified: Secondary | ICD-10-CM | POA: Diagnosis not present

## 2020-09-13 DIAGNOSIS — R262 Difficulty in walking, not elsewhere classified: Secondary | ICD-10-CM | POA: Diagnosis not present

## 2020-09-13 DIAGNOSIS — M25511 Pain in right shoulder: Secondary | ICD-10-CM | POA: Diagnosis not present

## 2020-09-13 DIAGNOSIS — M25561 Pain in right knee: Secondary | ICD-10-CM | POA: Diagnosis not present

## 2020-09-14 DIAGNOSIS — U071 COVID-19: Secondary | ICD-10-CM | POA: Diagnosis not present

## 2020-09-14 DIAGNOSIS — D649 Anemia, unspecified: Secondary | ICD-10-CM | POA: Diagnosis not present

## 2020-09-14 DIAGNOSIS — F209 Schizophrenia, unspecified: Secondary | ICD-10-CM | POA: Diagnosis not present

## 2020-09-14 DIAGNOSIS — M25511 Pain in right shoulder: Secondary | ICD-10-CM | POA: Diagnosis not present

## 2020-09-14 DIAGNOSIS — M25561 Pain in right knee: Secondary | ICD-10-CM | POA: Diagnosis not present

## 2020-09-14 DIAGNOSIS — Z20828 Contact with and (suspected) exposure to other viral communicable diseases: Secondary | ICD-10-CM | POA: Diagnosis not present

## 2020-09-14 DIAGNOSIS — M1711 Unilateral primary osteoarthritis, right knee: Secondary | ICD-10-CM | POA: Diagnosis not present

## 2020-09-14 DIAGNOSIS — R262 Difficulty in walking, not elsewhere classified: Secondary | ICD-10-CM | POA: Diagnosis not present

## 2020-09-15 DIAGNOSIS — R278 Other lack of coordination: Secondary | ICD-10-CM | POA: Diagnosis not present

## 2020-09-15 DIAGNOSIS — L089 Local infection of the skin and subcutaneous tissue, unspecified: Secondary | ICD-10-CM | POA: Diagnosis not present

## 2020-09-19 DIAGNOSIS — U071 COVID-19: Secondary | ICD-10-CM | POA: Diagnosis not present

## 2020-09-19 DIAGNOSIS — R278 Other lack of coordination: Secondary | ICD-10-CM | POA: Diagnosis not present

## 2020-09-19 DIAGNOSIS — Z20828 Contact with and (suspected) exposure to other viral communicable diseases: Secondary | ICD-10-CM | POA: Diagnosis not present

## 2020-09-21 DIAGNOSIS — R278 Other lack of coordination: Secondary | ICD-10-CM | POA: Diagnosis not present

## 2020-09-21 DIAGNOSIS — Z20828 Contact with and (suspected) exposure to other viral communicable diseases: Secondary | ICD-10-CM | POA: Diagnosis not present

## 2020-09-21 DIAGNOSIS — U071 COVID-19: Secondary | ICD-10-CM | POA: Diagnosis not present

## 2020-09-26 DIAGNOSIS — U071 COVID-19: Secondary | ICD-10-CM | POA: Diagnosis not present

## 2020-09-26 DIAGNOSIS — Z20828 Contact with and (suspected) exposure to other viral communicable diseases: Secondary | ICD-10-CM | POA: Diagnosis not present

## 2020-09-27 DIAGNOSIS — R278 Other lack of coordination: Secondary | ICD-10-CM | POA: Diagnosis not present

## 2020-09-28 DIAGNOSIS — U071 COVID-19: Secondary | ICD-10-CM | POA: Diagnosis not present

## 2020-09-28 DIAGNOSIS — Z20828 Contact with and (suspected) exposure to other viral communicable diseases: Secondary | ICD-10-CM | POA: Diagnosis not present

## 2020-09-29 DIAGNOSIS — R278 Other lack of coordination: Secondary | ICD-10-CM | POA: Diagnosis not present

## 2020-10-02 ENCOUNTER — Other Ambulatory Visit (HOSPITAL_COMMUNITY): Payer: Self-pay | Admitting: Internal Medicine

## 2020-10-02 DIAGNOSIS — Z1231 Encounter for screening mammogram for malignant neoplasm of breast: Secondary | ICD-10-CM

## 2020-10-03 DIAGNOSIS — I10 Essential (primary) hypertension: Secondary | ICD-10-CM | POA: Diagnosis not present

## 2020-10-03 DIAGNOSIS — U071 COVID-19: Secondary | ICD-10-CM | POA: Diagnosis not present

## 2020-10-03 DIAGNOSIS — Z20828 Contact with and (suspected) exposure to other viral communicable diseases: Secondary | ICD-10-CM | POA: Diagnosis not present

## 2020-10-03 DIAGNOSIS — F209 Schizophrenia, unspecified: Secondary | ICD-10-CM | POA: Diagnosis not present

## 2020-10-03 DIAGNOSIS — L259 Unspecified contact dermatitis, unspecified cause: Secondary | ICD-10-CM | POA: Diagnosis not present

## 2020-10-04 DIAGNOSIS — R278 Other lack of coordination: Secondary | ICD-10-CM | POA: Diagnosis not present

## 2020-10-05 DIAGNOSIS — Z20828 Contact with and (suspected) exposure to other viral communicable diseases: Secondary | ICD-10-CM | POA: Diagnosis not present

## 2020-10-05 DIAGNOSIS — U071 COVID-19: Secondary | ICD-10-CM | POA: Diagnosis not present

## 2020-10-06 DIAGNOSIS — R278 Other lack of coordination: Secondary | ICD-10-CM | POA: Diagnosis not present

## 2020-10-08 ENCOUNTER — Encounter (HOSPITAL_COMMUNITY): Payer: Self-pay | Admitting: Emergency Medicine

## 2020-10-08 ENCOUNTER — Emergency Department (HOSPITAL_COMMUNITY)
Admission: EM | Admit: 2020-10-08 | Discharge: 2020-10-08 | Disposition: A | Discharge status: Home / Self Care | Payer: Medicare Other | Attending: Emergency Medicine | Admitting: Emergency Medicine

## 2020-10-08 ENCOUNTER — Other Ambulatory Visit: Payer: Self-pay

## 2020-10-08 DIAGNOSIS — H6011 Cellulitis of right external ear: Secondary | ICD-10-CM | POA: Diagnosis not present

## 2020-10-08 DIAGNOSIS — L03211 Cellulitis of face: Secondary | ICD-10-CM | POA: Diagnosis not present

## 2020-10-08 DIAGNOSIS — I10 Essential (primary) hypertension: Secondary | ICD-10-CM | POA: Insufficient documentation

## 2020-10-08 DIAGNOSIS — Z79899 Other long term (current) drug therapy: Secondary | ICD-10-CM | POA: Diagnosis not present

## 2020-10-08 DIAGNOSIS — R0902 Hypoxemia: Secondary | ICD-10-CM | POA: Diagnosis not present

## 2020-10-08 DIAGNOSIS — R5381 Other malaise: Secondary | ICD-10-CM | POA: Diagnosis not present

## 2020-10-08 DIAGNOSIS — H6123 Impacted cerumen, bilateral: Secondary | ICD-10-CM | POA: Diagnosis not present

## 2020-10-08 DIAGNOSIS — R220 Localized swelling, mass and lump, head: Secondary | ICD-10-CM | POA: Diagnosis present

## 2020-10-08 DIAGNOSIS — R609 Edema, unspecified: Secondary | ICD-10-CM | POA: Diagnosis not present

## 2020-10-08 MED ORDER — DOXYCYCLINE HYCLATE 100 MG PO TABS
100.0000 mg | ORAL_TABLET | Freq: Once | ORAL | Status: AC
  Administered 2020-10-08: 100 mg via ORAL
  Filled 2020-10-08: qty 1

## 2020-10-08 MED ORDER — DOXYCYCLINE HYCLATE 100 MG PO CAPS: 100.0000 mg | ORAL_CAPSULE | Freq: Two times a day (BID) | ORAL | 0 refills | Status: DC

## 2020-10-08 NOTE — ED Triage Notes (Signed)
Pt from [facility] via [facility]. Pt reports right sided facial swelling and ear swelling that started yesterday. Denies dif. Breathing.

## 2020-10-08 NOTE — Discharge Instructions (Addendum)
Take the antibiotic as directed until its finished.  Follow-up with Dr. Felecia Shelling later this week for recheck.

## 2020-10-08 NOTE — ED Notes (Signed)
Patient being discharged, [facility] contacted. Discharge instructions given to [facility]. [facility] sending transportation to get patient.

## 2020-10-09 DIAGNOSIS — R278 Other lack of coordination: Secondary | ICD-10-CM | POA: Diagnosis not present

## 2020-10-10 DIAGNOSIS — Z20828 Contact with and (suspected) exposure to other viral communicable diseases: Secondary | ICD-10-CM | POA: Diagnosis not present

## 2020-10-10 DIAGNOSIS — U071 COVID-19: Secondary | ICD-10-CM | POA: Diagnosis not present

## 2020-10-10 NOTE — ED Provider Notes (Signed)
[facility] EMERGENCY DEPARTMENT Provider Note   CSN: [id number] Arrival date & time: 10/08/20  1135     History Chief Complaint  Patient presents with  . Facial Swelling    HELAYNE METSKER is a 65 y.o. female.  HPI      CALIYAH SIEH is a 65 y.o. female with past medical history of hypertension, schizophrenia and seizures who presents to the Emergency Department from [facility].  Patient sent to the ER for evaluation of right-sided facial swelling and swelling of the right ear.  Symptoms were noticed by facility staff yesterday.  Patient reports having pain along her right cheekbone area.  No known trauma or injury, fever, chills, eye pain or visual changes, neck pain or difficulty swallowing or breathing.  No known insect bite.  Denies facial lesions.     Past Medical History:  Diagnosis Date  . H. pylori infection 06/01/2019   Treated with amoxicillin, Biaxin, Protonix  . HTN (hypertension)   . Manic depression (HCC)   . Schizophrenia (HCC)   . Seizures [facility])     Patient Active Problem List   Diagnosis Date Noted  . Helicobacter pylori gastritis 02/18/2020  . Anemia 05/31/2019  . Schizophrenia (HCC) 05/31/2019  . Seizure (HCC) 05/31/2019  . History of weight loss   . Elevated liver enzymes 11/23/2018  . Normocytic anemia 11/23/2018  . Loss of weight 11/23/2018  . Depression 09/24/2018    Past Surgical History:  Procedure Laterality Date  . BIOPSY  06/01/2019   Procedure: BIOPSY;  Surgeon: West Bali, MD;  [facility];  Service: Endoscopy;;  gastric  . COLONOSCOPY  02/2009   Dr. Darrick Penna: slightly torturous colon otherwise normal   . COLONOSCOPY N/A 06/02/2019   Procedure: COLONOSCOPY;  Surgeon: Corbin Ade, MD; redundant colon, 5 mm polyp in the descending colon, otherwise normal exam.  Path with tubular adenoma.  Recommended repeat in 5 years.  . COLONOSCOPY WITH PROPOFOL N/A 06/01/2019   Procedure: COLONOSCOPY  WITH PROPOFOL;  Surgeon: West Bali, MD; incomplete due to poor prep  . ESOPHAGOGASTRODUODENOSCOPY (EGD) WITH PROPOFOL N/A 06/01/2019   Procedure: ESOPHAGOGASTRODUODENOSCOPY (EGD) WITH PROPOFOL;  Surgeon: West Bali, MD;  Normal esophagus, gastritis s/p biopsy, normal duodenum.  Pathology positive for H. Pylori.  Treated with amoxicillin, Biaxin, and Protonix.   Marland Kitchen POLYPECTOMY  06/02/2019   Procedure: POLYPECTOMY;  Surgeon: Corbin Ade, MD;  [facility];  Service: Endoscopy;;  colon     OB History   No obstetric history on file.     Family History  Problem Relation Age of Onset  . Colon cancer Neg Hx   . Colon polyps Neg Hx     Social History   Tobacco Use  . Smoking status: Never Smoker  . Smokeless tobacco: Never Used  Vaping Use  . Vaping Use: Never used  Substance Use Topics  . Alcohol use: No  . Drug use: No    Home Medications Prior to Admission medications   Medication Sig Start Date End Date Taking? Authorizing Provider  doxycycline (VIBRAMYCIN) 100 MG capsule Take 1 capsule (100 mg total) by mouth 2 (two) times daily. 10/08/20  Yes Triplett, Tammy, PA-C  clonazePAM (KLONOPIN) 1 MG tablet Take 1 tablet (1 mg total) by mouth 2 (two) times daily as needed (agitation). 06/02/19   Johnson, Clanford L, MD  divalproex (DEPAKOTE) 250 MG DR tablet Take 250 mg by mouth 2 (two) times daily.  12/15/15   [provider]  donepezil (ARICEPT) 10 MG tablet Take 10 mg by mouth daily.  12/15/15   [provider]  fluvoxaMINE (LUVOX) 100 MG tablet Take 100 mg by mouth at bedtime.  12/15/15   [provider]  lisinopril (PRINIVIL,ZESTRIL) 10 MG tablet Take 10 mg by mouth daily.  12/15/15   [provider]  memantine (NAMENDA) 10 MG tablet Take 10 mg by mouth 2 (two) times daily.  12/15/15   [provider]  OLANZapine (ZYPREXA) 2.5 MG tablet Take 2.5 mg by mouth in the morning.    [provider]  OLANZapine  (ZYPREXA) 5 MG tablet Take 5 mg by mouth at bedtime.    [provider]  pantoprazole (PROTONIX) 40 MG tablet 1 PO BID FOR 10 DAYS THEN ONCE DAILY FOREVER 06/04/19   Fields, Darleene Cleaver, MD  PHENobarbital (LUMINAL) 97.2 MG tablet Take 97.2 mg by mouth at bedtime.  12/09/15   [provider]    Allergies    Patient has no known allergies.  Review of Systems   Review of Systems  Constitutional: Negative for chills, fatigue and fever.  HENT: Positive for ear pain and facial swelling. Negative for congestion, dental problem, nosebleeds, sinus pressure, sore throat and trouble swallowing.        Swelling and itching of the right face and right external ear  Eyes: Negative for pain, redness and visual disturbance.  Respiratory: Negative for cough and shortness of breath.   Cardiovascular: Negative for chest pain.  Gastrointestinal: Negative for abdominal pain, nausea and vomiting.  Musculoskeletal: Negative for arthralgias, neck pain and neck stiffness.  Skin: Negative for rash and wound.  Neurological: Negative for dizziness, weakness, numbness and headaches.  Hematological: Does not bruise/bleed easily.    Physical Exam Updated Vital Signs BP 116/82 (BP Location: Left Arm)   Pulse 70   Temp 98.7 F (37.1 C) (Oral)   Resp 18   SpO2 100%   Physical Exam Vitals and nursing note reviewed.  Constitutional:      General: She is not in acute distress.    Appearance: Normal appearance. She is not ill-appearing or toxic-appearing.  HENT:     Head: Normocephalic.     Comments: Localized 4 cm area of erythema along the right cheekbone.  2-3 centimeter slightly raised papules.  No pustules or vesicles    Right Ear: No mastoid tenderness.     Left Ear: No mastoid tenderness.     Ears:     Comments: Diffuse edema noted of the right external ear.  No significant erythema or wound.  Moderate cerumen noted to both ear canals.  Unable to visualize TM due to cerumen.  No edema of the  ear canals.  Left external ear normal appearing.    Nose: Nose normal.     Mouth/Throat:     Mouth: Mucous membranes are moist.     Pharynx: Oropharynx is clear. No oropharyngeal exudate or posterior oropharyngeal erythema.     Comments: No oral lesions, no edema of the tongue or lips.  Patient is edentulous. Eyes:     Extraocular Movements: Extraocular movements intact.     Conjunctiva/sclera: Conjunctivae normal.     Pupils: Pupils are equal, round, and reactive to light.     Comments: No periorbital erythema or edema.   Neck:     Thyroid: No thyromegaly.     Meningeal: Kernig's sign absent.  Cardiovascular:     Rate and Rhythm: Normal  rate and regular rhythm.     Pulses: Normal pulses.  Pulmonary:     Effort: Pulmonary effort is normal.     Breath sounds: Normal breath sounds. No wheezing.  Abdominal:     Palpations: Abdomen is soft.     Tenderness: There is no abdominal tenderness.  Musculoskeletal:        General: Normal range of motion.     Cervical back: Normal range of motion. No rigidity.  Lymphadenopathy:     Head:     Right side of head: No submandibular, preauricular or posterior auricular adenopathy.     Cervical: No cervical adenopathy.     Right cervical: No superficial cervical adenopathy.    Left cervical: No superficial cervical adenopathy.  Skin:    General: Skin is warm.     Findings: No rash.  Neurological:     General: No focal deficit present.     Mental Status: She is alert.     Sensory: No sensory deficit.     Motor: No weakness.     ED Results / Procedures / Treatments   Labs (all labs ordered are listed, but only abnormal results are displayed) Labs Reviewed - No data to display  EKG None  Radiology No results found.  Procedures Procedures   Medications Ordered in ED Medications  doxycycline (VIBRA-TABS) tablet 100 mg (100 mg Oral Given 10/08/20 1515)    ED Course  I have reviewed the triage vital signs and the nursing  notes.  Pertinent labs & imaging results that were available during my care of the patient were reviewed by me and considered in my medical decision making (see chart for details).    MDM Rules/Calculators/A&P                          Patient sent here from assisted living facility for evaluation of right facial pain, possible rash and swelling of the external right ear.  Symptoms are localized.  No history of injury, patient denies insect bite or new medications. There is noted swelling to the external right ear and a focal area of erythema along the right cheekbone.  No edema noted of the face or significant findings within the mouth.  Patient is nontoxic-appearing.  Findings appear consistent with cellulitis.  differential would also include possible developing zoster, but at this time there are no vesicles.  No periorbital involvement or reported visual changes.  Patient also seen by Dr. Wilkie Aye and care plan discussed.  Since this is likely developing cellulitis of the face, will start patient on doxycycline and advised to follow-up with PCP later this week.  Return precautions were also given.  Final Clinical Impression(s) / ED Diagnoses Final diagnoses:  Facial cellulitis    Rx / DC Orders ED Discharge Orders         Ordered    doxycycline (VIBRAMYCIN) 100 MG capsule  2 times daily        10/08/20 1525           [place], [place], PA-C 10/10/20 1409    Horton, Clabe Seal, DO 10/11/20 2349

## 2020-10-11 DIAGNOSIS — S60562A Insect bite (nonvenomous) of left hand, initial encounter: Secondary | ICD-10-CM | POA: Diagnosis not present

## 2020-10-11 DIAGNOSIS — R278 Other lack of coordination: Secondary | ICD-10-CM | POA: Diagnosis not present

## 2020-10-12 DIAGNOSIS — Z20828 Contact with and (suspected) exposure to other viral communicable diseases: Secondary | ICD-10-CM | POA: Diagnosis not present

## 2020-10-12 DIAGNOSIS — U071 COVID-19: Secondary | ICD-10-CM | POA: Diagnosis not present

## 2020-10-15 ENCOUNTER — Emergency Department (HOSPITAL_COMMUNITY)
Admission: EM | Admit: 2020-10-15 | Discharge: 2020-10-16 | Disposition: A | Discharge status: Home / Self Care | Payer: Medicare Other | Attending: Emergency Medicine | Admitting: Emergency Medicine

## 2020-10-15 ENCOUNTER — Other Ambulatory Visit: Payer: Self-pay

## 2020-10-15 ENCOUNTER — Emergency Department (HOSPITAL_COMMUNITY): Payer: Medicare Other

## 2020-10-15 ENCOUNTER — Encounter (HOSPITAL_COMMUNITY): Payer: Self-pay

## 2020-10-15 DIAGNOSIS — L539 Erythematous condition, unspecified: Secondary | ICD-10-CM | POA: Insufficient documentation

## 2020-10-15 DIAGNOSIS — R220 Localized swelling, mass and lump, head: Secondary | ICD-10-CM

## 2020-10-15 DIAGNOSIS — Z79899 Other long term (current) drug therapy: Secondary | ICD-10-CM | POA: Diagnosis not present

## 2020-10-15 DIAGNOSIS — I10 Essential (primary) hypertension: Secondary | ICD-10-CM | POA: Insufficient documentation

## 2020-10-15 DIAGNOSIS — H05019 Cellulitis of unspecified orbit: Secondary | ICD-10-CM | POA: Diagnosis not present

## 2020-10-15 DIAGNOSIS — Z1231 Encounter for screening mammogram for malignant neoplasm of breast: Secondary | ICD-10-CM | POA: Diagnosis not present

## 2020-10-15 DIAGNOSIS — H02846 Edema of left eye, unspecified eyelid: Secondary | ICD-10-CM | POA: Diagnosis not present
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
## 2020-10-16 ENCOUNTER — Ambulatory Visit (HOSPITAL_COMMUNITY)
Admission: RE | Admit: 2020-10-16 | Discharge: 2020-10-16 | Disposition: A | Discharge status: Home / Self Care | Payer: Medicare Other | Source: Ambulatory Visit | Attending: Internal Medicine | Admitting: Internal Medicine

## 2020-10-16 DIAGNOSIS — R220 Localized swelling, mass and lump, head: Secondary | ICD-10-CM | POA: Diagnosis not present

## 2020-10-16 DIAGNOSIS — Z1231 Encounter for screening mammogram for malignant neoplasm of breast: Secondary | ICD-10-CM

## 2020-10-16 DIAGNOSIS — H05019 Cellulitis of unspecified orbit: Secondary | ICD-10-CM | POA: Diagnosis not present

## 2020-10-16 DIAGNOSIS — H02846 Edema of left eye, unspecified eyelid: Secondary | ICD-10-CM | POA: Diagnosis not present

## 2020-10-16 LAB — BASIC METABOLIC PANEL
Anion gap: 13 (ref 5–15)
BUN: 11 mg/dL (ref 8–23)
CO2: 22 mmol/L (ref 22–32)
Calcium: 9.6 mg/dL (ref 8.9–10.3)
Chloride: 102 mmol/L (ref 98–111)
Creatinine, Ser: 0.47 mg/dL (ref 0.44–1.00)
GFR, Estimated: >60 mL/min (ref >60)
Glucose, Bld: 169 mg/dL — ABNORMAL HIGH (ref 70–99)
Potassium: 3.6 mmol/L (ref 3.5–5.1)
Sodium: 137 mmol/L (ref 135–145)

## 2020-10-16 LAB — CBC WITH DIFFERENTIAL/PLATELET
Abs Immature Granulocytes: 0.01 10*3/uL (ref 0.00–0.07)
Basophils Absolute: 0 10*3/uL (ref 0.0–0.1)
Basophils Relative: 0 %
Eosinophils Absolute: 0.2 10*3/uL (ref 0.0–0.5)
Eosinophils Relative: 5 %
HCT: 38.3 % (ref 36.0–46.0)
Hemoglobin: 12.2 g/dL (ref 12.0–15.0)
Immature Granulocytes: 0 %
Lymphocytes Relative: 59 %
Lymphs Abs: 2.8 10*3/uL (ref 0.7–4.0)
MCH: 29.5 pg (ref 26.0–34.0)
MCHC: 31.9 g/dL (ref 30.0–36.0)
MCV: 92.5 fL (ref 80.0–100.0)
Monocytes Absolute: 0.4 10*3/uL (ref 0.1–1.0)
Monocytes Relative: 8 %
Neutro Abs: 1.3 10*3/uL — ABNORMAL LOW (ref 1.7–7.7)
Neutrophils Relative %: 28 %
Platelets: 249 10*3/uL (ref 150–400)
RBC: 4.14 MIL/uL (ref 3.87–5.11)
RDW: 12.3 % (ref 11.5–15.5)
WBC: 4.7 10*3/uL (ref 4.0–10.5)
nRBC: 0 % (ref 0.0–0.2)

## 2020-10-16 MED ORDER — IOHEXOL 300 MG/ML  SOLN
75.0000 mL | Freq: Once | INTRAMUSCULAR | Status: AC | PRN
  Administered 2020-10-16: 75 mL via INTRAVENOUS

## 2020-10-16 MED ORDER — METOPROLOL SUCCINATE ER 25 MG PO TB24: 25.0000 mg | ORAL_TABLET | Freq: Every day | ORAL | 1 refills | Status: AC

## 2020-10-16 MED ORDER — CLINDAMYCIN HCL 300 MG PO CAPS: 300.0000 mg | ORAL_CAPSULE | Freq: Four times a day (QID) | ORAL | 0 refills | Status: DC

## 2020-10-16 NOTE — ED Notes (Signed)
Pt to CT

## 2020-10-16 NOTE — Discharge Instructions (Addendum)
It is not clear at this point but the swelling around her eyes is from. The left one does look red and is little more swollen the right. It still could be an infection however your white blood cell count is normal and there is no evidence of inflammation on your CT scan making that a little bit less likely. You also have medication called lisinopril that can cause swelling like this. What I would suggest is to try another round of antibiotics and switch the lisinopril to metoprolol. Follow-up with your doctor in 7 to 10 days to make sure things are improving and to ensure your blood pressures are stable. Return here for any NEW or WORSENING symptoms.

## 2020-10-17 DIAGNOSIS — Z20828 Contact with and (suspected) exposure to other viral communicable diseases: Secondary | ICD-10-CM | POA: Diagnosis not present

## 2020-10-17 DIAGNOSIS — U071 COVID-19: Secondary | ICD-10-CM | POA: Diagnosis not present

## 2020-10-19 DIAGNOSIS — Z20828 Contact with and (suspected) exposure to other viral communicable diseases: Secondary | ICD-10-CM | POA: Diagnosis not present

## 2020-10-19 DIAGNOSIS — U071 COVID-19: Secondary | ICD-10-CM | POA: Diagnosis not present

## 2020-10-24 DIAGNOSIS — Z20828 Contact with and (suspected) exposure to other viral communicable diseases: Secondary | ICD-10-CM | POA: Diagnosis not present

## 2020-10-24 DIAGNOSIS — U071 COVID-19: Secondary | ICD-10-CM | POA: Diagnosis not present

## 2020-10-31 DIAGNOSIS — Z20828 Contact with and (suspected) exposure to other viral communicable diseases: Secondary | ICD-10-CM | POA: Diagnosis not present

## 2020-10-31 DIAGNOSIS — U071 COVID-19: Secondary | ICD-10-CM | POA: Diagnosis not present

## 2020-11-02 DIAGNOSIS — L03221 Cellulitis of neck: Secondary | ICD-10-CM | POA: Diagnosis not present

## 2020-11-07 DIAGNOSIS — U071 COVID-19: Secondary | ICD-10-CM | POA: Diagnosis not present

## 2020-11-07 DIAGNOSIS — Z20828 Contact with and (suspected) exposure to other viral communicable diseases: Secondary | ICD-10-CM | POA: Diagnosis not present

## 2020-11-07 DIAGNOSIS — R278 Other lack of coordination: Secondary | ICD-10-CM | POA: Diagnosis not present

## 2020-11-08 DIAGNOSIS — R2681 Unsteadiness on feet: Secondary | ICD-10-CM | POA: Diagnosis not present

## 2020-11-08 DIAGNOSIS — I10 Essential (primary) hypertension: Secondary | ICD-10-CM | POA: Diagnosis not present

## 2020-11-08 DIAGNOSIS — R2689 Other abnormalities of gait and mobility: Secondary | ICD-10-CM | POA: Diagnosis not present

## 2020-11-08 DIAGNOSIS — M170 Bilateral primary osteoarthritis of knee: Secondary | ICD-10-CM | POA: Diagnosis not present

## 2020-11-08 DIAGNOSIS — F209 Schizophrenia, unspecified: Secondary | ICD-10-CM | POA: Diagnosis not present

## 2020-11-10 DIAGNOSIS — R278 Other lack of coordination: Secondary | ICD-10-CM | POA: Diagnosis not present

## 2020-11-13 DIAGNOSIS — R278 Other lack of coordination: Secondary | ICD-10-CM | POA: Diagnosis not present

## 2020-11-14 DIAGNOSIS — R2689 Other abnormalities of gait and mobility: Secondary | ICD-10-CM | POA: Diagnosis not present

## 2020-11-14 DIAGNOSIS — M170 Bilateral primary osteoarthritis of knee: Secondary | ICD-10-CM | POA: Diagnosis not present

## 2020-11-14 DIAGNOSIS — R2681 Unsteadiness on feet: Secondary | ICD-10-CM | POA: Diagnosis not present

## 2020-11-16 DIAGNOSIS — R2681 Unsteadiness on feet: Secondary | ICD-10-CM | POA: Diagnosis not present

## 2020-11-16 DIAGNOSIS — R296 Repeated falls: Secondary | ICD-10-CM | POA: Diagnosis not present

## 2020-11-16 DIAGNOSIS — M170 Bilateral primary osteoarthritis of knee: Secondary | ICD-10-CM | POA: Diagnosis not present

## 2020-11-16 DIAGNOSIS — R2689 Other abnormalities of gait and mobility: Secondary | ICD-10-CM | POA: Diagnosis not present

## 2020-11-16 DIAGNOSIS — U071 COVID-19: Secondary | ICD-10-CM | POA: Diagnosis not present

## 2020-11-16 DIAGNOSIS — Z20828 Contact with and (suspected) exposure to other viral communicable diseases: Secondary | ICD-10-CM | POA: Diagnosis not present

## 2020-11-17 DIAGNOSIS — R278 Other lack of coordination: Secondary | ICD-10-CM | POA: Diagnosis not present

## 2020-11-21 DIAGNOSIS — M79674 Pain in right toe(s): Secondary | ICD-10-CM | POA: Diagnosis not present

## 2020-11-21 DIAGNOSIS — M79675 Pain in left toe(s): Secondary | ICD-10-CM | POA: Diagnosis not present

## 2020-11-21 DIAGNOSIS — R296 Repeated falls: Secondary | ICD-10-CM | POA: Diagnosis not present

## 2020-11-21 DIAGNOSIS — Z20828 Contact with and (suspected) exposure to other viral communicable diseases: Secondary | ICD-10-CM | POA: Diagnosis not present

## 2020-11-21 DIAGNOSIS — B351 Tinea unguium: Secondary | ICD-10-CM | POA: Diagnosis not present

## 2020-11-21 DIAGNOSIS — U071 COVID-19: Secondary | ICD-10-CM | POA: Diagnosis not present

## 2020-11-21 DIAGNOSIS — R2689 Other abnormalities of gait and mobility: Secondary | ICD-10-CM | POA: Diagnosis not present

## 2020-11-21 DIAGNOSIS — R2681 Unsteadiness on feet: Secondary | ICD-10-CM | POA: Diagnosis not present

## 2020-11-21 DIAGNOSIS — M170 Bilateral primary osteoarthritis of knee: Secondary | ICD-10-CM | POA: Diagnosis not present

## 2020-11-23 DIAGNOSIS — R296 Repeated falls: Secondary | ICD-10-CM | POA: Diagnosis not present

## 2020-11-23 DIAGNOSIS — R2689 Other abnormalities of gait and mobility: Secondary | ICD-10-CM | POA: Diagnosis not present

## 2020-11-23 DIAGNOSIS — M170 Bilateral primary osteoarthritis of knee: Secondary | ICD-10-CM | POA: Diagnosis not present

## 2020-11-23 DIAGNOSIS — R2681 Unsteadiness on feet: Secondary | ICD-10-CM | POA: Diagnosis not present

## 2020-11-26 DIAGNOSIS — R296 Repeated falls: Secondary | ICD-10-CM | POA: Diagnosis not present

## 2020-11-26 DIAGNOSIS — R2681 Unsteadiness on feet: Secondary | ICD-10-CM | POA: Diagnosis not present

## 2020-11-26 DIAGNOSIS — R2689 Other abnormalities of gait and mobility: Secondary | ICD-10-CM | POA: Diagnosis not present

## 2020-11-26 DIAGNOSIS — M170 Bilateral primary osteoarthritis of knee: Secondary | ICD-10-CM | POA: Diagnosis not present

## 2020-11-28 DIAGNOSIS — U071 COVID-19: Secondary | ICD-10-CM | POA: Diagnosis not present

## 2020-11-28 DIAGNOSIS — M170 Bilateral primary osteoarthritis of knee: Secondary | ICD-10-CM | POA: Diagnosis not present

## 2020-11-28 DIAGNOSIS — R2681 Unsteadiness on feet: Secondary | ICD-10-CM | POA: Diagnosis not present

## 2020-11-28 DIAGNOSIS — R2689 Other abnormalities of gait and mobility: Secondary | ICD-10-CM | POA: Diagnosis not present

## 2020-11-28 DIAGNOSIS — R296 Repeated falls: Secondary | ICD-10-CM | POA: Diagnosis not present

## 2020-11-28 DIAGNOSIS — Z20828 Contact with and (suspected) exposure to other viral communicable diseases: Secondary | ICD-10-CM | POA: Diagnosis not present

## 2020-12-05 DIAGNOSIS — M170 Bilateral primary osteoarthritis of knee: Secondary | ICD-10-CM | POA: Diagnosis not present

## 2020-12-05 DIAGNOSIS — R2689 Other abnormalities of gait and mobility: Secondary | ICD-10-CM | POA: Diagnosis not present

## 2020-12-05 DIAGNOSIS — Z20828 Contact with and (suspected) exposure to other viral communicable diseases: Secondary | ICD-10-CM | POA: Diagnosis not present

## 2020-12-05 DIAGNOSIS — U071 COVID-19: Secondary | ICD-10-CM | POA: Diagnosis not present

## 2020-12-05 DIAGNOSIS — R2681 Unsteadiness on feet: Secondary | ICD-10-CM | POA: Diagnosis not present

## 2020-12-05 DIAGNOSIS — R296 Repeated falls: Secondary | ICD-10-CM | POA: Diagnosis not present

## 2020-12-07 DIAGNOSIS — R2689 Other abnormalities of gait and mobility: Secondary | ICD-10-CM | POA: Diagnosis not present

## 2020-12-07 DIAGNOSIS — R2681 Unsteadiness on feet: Secondary | ICD-10-CM | POA: Diagnosis not present

## 2020-12-07 DIAGNOSIS — R296 Repeated falls: Secondary | ICD-10-CM | POA: Diagnosis not present

## 2020-12-07 DIAGNOSIS — M170 Bilateral primary osteoarthritis of knee: Secondary | ICD-10-CM | POA: Diagnosis not present

## 2020-12-09 DIAGNOSIS — F209 Schizophrenia, unspecified: Secondary | ICD-10-CM | POA: Diagnosis not present

## 2020-12-09 DIAGNOSIS — I10 Essential (primary) hypertension: Secondary | ICD-10-CM | POA: Diagnosis not present

## 2020-12-12 DIAGNOSIS — Z20828 Contact with and (suspected) exposure to other viral communicable diseases: Secondary | ICD-10-CM | POA: Diagnosis not present

## 2020-12-12 DIAGNOSIS — U071 COVID-19: Secondary | ICD-10-CM | POA: Diagnosis not present

## 2020-12-13 DIAGNOSIS — M170 Bilateral primary osteoarthritis of knee: Secondary | ICD-10-CM | POA: Diagnosis not present

## 2020-12-13 DIAGNOSIS — R2689 Other abnormalities of gait and mobility: Secondary | ICD-10-CM | POA: Diagnosis not present

## 2020-12-13 DIAGNOSIS — R296 Repeated falls: Secondary | ICD-10-CM | POA: Diagnosis not present

## 2020-12-13 DIAGNOSIS — R2681 Unsteadiness on feet: Secondary | ICD-10-CM | POA: Diagnosis not present

## 2020-12-14 DIAGNOSIS — R2681 Unsteadiness on feet: Secondary | ICD-10-CM | POA: Diagnosis not present

## 2020-12-14 DIAGNOSIS — R2689 Other abnormalities of gait and mobility: Secondary | ICD-10-CM | POA: Diagnosis not present

## 2020-12-14 DIAGNOSIS — M170 Bilateral primary osteoarthritis of knee: Secondary | ICD-10-CM | POA: Diagnosis not present

## 2020-12-14 DIAGNOSIS — R296 Repeated falls: Secondary | ICD-10-CM | POA: Diagnosis not present

## 2020-12-19 DIAGNOSIS — U071 COVID-19: Secondary | ICD-10-CM | POA: Diagnosis not present

## 2020-12-19 DIAGNOSIS — Z20828 Contact with and (suspected) exposure to other viral communicable diseases: Secondary | ICD-10-CM | POA: Diagnosis not present

## 2020-12-26 DIAGNOSIS — Z20828 Contact with and (suspected) exposure to other viral communicable diseases: Secondary | ICD-10-CM | POA: Diagnosis not present

## 2020-12-26 DIAGNOSIS — U071 COVID-19: Secondary | ICD-10-CM | POA: Diagnosis not present

## 2021-01-02 DIAGNOSIS — U071 COVID-19: Secondary | ICD-10-CM | POA: Diagnosis not present

## 2021-01-02 DIAGNOSIS — Z20828 Contact with and (suspected) exposure to other viral communicable diseases: Secondary | ICD-10-CM | POA: Diagnosis not present

## 2021-01-08 DIAGNOSIS — F209 Schizophrenia, unspecified: Secondary | ICD-10-CM | POA: Diagnosis not present

## 2021-01-08 DIAGNOSIS — I10 Essential (primary) hypertension: Secondary | ICD-10-CM | POA: Diagnosis not present

## 2021-01-09 DIAGNOSIS — U071 COVID-19: Secondary | ICD-10-CM | POA: Diagnosis not present

## 2021-01-09 DIAGNOSIS — Z20828 Contact with and (suspected) exposure to other viral communicable diseases: Secondary | ICD-10-CM | POA: Diagnosis not present

## 2021-01-17 DIAGNOSIS — U071 COVID-19: Secondary | ICD-10-CM | POA: Diagnosis not present

## 2021-01-17 DIAGNOSIS — Z20828 Contact with and (suspected) exposure to other viral communicable diseases: Secondary | ICD-10-CM | POA: Diagnosis not present

## 2021-01-19 DIAGNOSIS — M25562 Pain in left knee: Secondary | ICD-10-CM | POA: Diagnosis not present

## 2021-01-19 DIAGNOSIS — F209 Schizophrenia, unspecified: Secondary | ICD-10-CM | POA: Diagnosis not present

## 2021-01-19 DIAGNOSIS — I10 Essential (primary) hypertension: Secondary | ICD-10-CM | POA: Diagnosis not present

## 2021-01-19 DIAGNOSIS — G40909 Epilepsy, unspecified, not intractable, without status epilepticus: Secondary | ICD-10-CM | POA: Diagnosis not present

## 2021-01-23 DIAGNOSIS — Z20828 Contact with and (suspected) exposure to other viral communicable diseases: Secondary | ICD-10-CM | POA: Diagnosis not present

## 2021-01-23 DIAGNOSIS — U071 COVID-19: Secondary | ICD-10-CM | POA: Diagnosis not present

## 2021-01-26 DIAGNOSIS — N390 Urinary tract infection, site not specified: Secondary | ICD-10-CM | POA: Diagnosis not present

## 2021-01-30 DIAGNOSIS — Z20828 Contact with and (suspected) exposure to other viral communicable diseases: Secondary | ICD-10-CM | POA: Diagnosis not present

## 2021-01-30 DIAGNOSIS — U071 COVID-19: Secondary | ICD-10-CM | POA: Diagnosis not present

## 2021-02-06 DIAGNOSIS — U071 COVID-19: Secondary | ICD-10-CM | POA: Diagnosis not present

## 2021-02-06 DIAGNOSIS — Z20828 Contact with and (suspected) exposure to other viral communicable diseases: Secondary | ICD-10-CM | POA: Diagnosis not present

## 2021-02-13 DIAGNOSIS — U071 COVID-19: Secondary | ICD-10-CM | POA: Diagnosis not present

## 2021-02-13 DIAGNOSIS — Z20828 Contact with and (suspected) exposure to other viral communicable diseases: Secondary | ICD-10-CM | POA: Diagnosis not present

## 2021-02-15 DIAGNOSIS — Z79899 Other long term (current) drug therapy: Secondary | ICD-10-CM | POA: Diagnosis not present

## 2021-02-18 DIAGNOSIS — G40909 Epilepsy, unspecified, not intractable, without status epilepticus: Secondary | ICD-10-CM | POA: Diagnosis not present

## 2021-02-18 DIAGNOSIS — F209 Schizophrenia, unspecified: Secondary | ICD-10-CM | POA: Diagnosis not present

## 2021-02-20 DIAGNOSIS — F200 Paranoid schizophrenia: Secondary | ICD-10-CM | POA: Diagnosis not present

## 2021-02-20 DIAGNOSIS — F3341 Major depressive disorder, recurrent, in partial remission: Secondary | ICD-10-CM | POA: Diagnosis not present

## 2021-02-20 DIAGNOSIS — F981 Encopresis not due to a substance or known physiological condition: Secondary | ICD-10-CM | POA: Diagnosis not present

## 2021-02-20 DIAGNOSIS — G3184 Mild cognitive impairment, so stated: Secondary | ICD-10-CM | POA: Diagnosis not present

## 2021-02-22 DIAGNOSIS — N390 Urinary tract infection, site not specified: Secondary | ICD-10-CM | POA: Diagnosis not present

## 2021-02-27 DIAGNOSIS — Z20828 Contact with and (suspected) exposure to other viral communicable diseases: Secondary | ICD-10-CM | POA: Diagnosis not present

## 2021-02-27 DIAGNOSIS — U071 COVID-19: Secondary | ICD-10-CM | POA: Diagnosis not present

## 2021-03-02 ENCOUNTER — Ambulatory Visit (HOSPITAL_COMMUNITY)
Admission: RE | Admit: 2021-03-02 | Discharge: 2021-03-02 | Disposition: A | Discharge status: Home / Self Care | Payer: Medicare Other | Source: Ambulatory Visit | Attending: Gerontology | Admitting: Gerontology

## 2021-03-02 ENCOUNTER — Other Ambulatory Visit (HOSPITAL_COMMUNITY): Payer: Self-pay | Admitting: Gerontology

## 2021-03-02 ENCOUNTER — Other Ambulatory Visit: Payer: Self-pay

## 2021-03-02 DIAGNOSIS — M25562 Pain in left knee: Secondary | ICD-10-CM | POA: Diagnosis not present

## 2021-03-02 DIAGNOSIS — M25561 Pain in right knee: Secondary | ICD-10-CM

## 2021-03-02 DIAGNOSIS — M25461 Effusion, right knee: Secondary | ICD-10-CM | POA: Diagnosis not present

## 2021-03-02 DIAGNOSIS — M25462 Effusion, left knee: Secondary | ICD-10-CM | POA: Diagnosis not present

## 2021-03-06 DIAGNOSIS — Z20828 Contact with and (suspected) exposure to other viral communicable diseases: Secondary | ICD-10-CM | POA: Diagnosis not present

## 2021-03-06 DIAGNOSIS — U071 COVID-19: Secondary | ICD-10-CM | POA: Diagnosis not present

## 2021-03-07 DIAGNOSIS — H25813 Combined forms of age-related cataract, bilateral: Secondary | ICD-10-CM | POA: Diagnosis not present

## 2021-03-13 DIAGNOSIS — Z20828 Contact with and (suspected) exposure to other viral communicable diseases: Secondary | ICD-10-CM | POA: Diagnosis not present

## 2021-03-13 DIAGNOSIS — U071 COVID-19: Secondary | ICD-10-CM | POA: Diagnosis not present

## 2021-03-20 DIAGNOSIS — Z20828 Contact with and (suspected) exposure to other viral communicable diseases: Secondary | ICD-10-CM | POA: Diagnosis not present

## 2021-03-20 DIAGNOSIS — U071 COVID-19: Secondary | ICD-10-CM | POA: Diagnosis not present

## 2021-03-21 DIAGNOSIS — F70 Mild intellectual disabilities: Secondary | ICD-10-CM | POA: Diagnosis not present

## 2021-03-21 DIAGNOSIS — Z79899 Other long term (current) drug therapy: Secondary | ICD-10-CM | POA: Diagnosis not present

## 2021-03-21 DIAGNOSIS — G40909 Epilepsy, unspecified, not intractable, without status epilepticus: Secondary | ICD-10-CM | POA: Diagnosis not present

## 2021-03-21 DIAGNOSIS — G2111 Neuroleptic induced parkinsonism: Secondary | ICD-10-CM | POA: Diagnosis not present

## 2021-03-21 DIAGNOSIS — I10 Essential (primary) hypertension: Secondary | ICD-10-CM | POA: Diagnosis not present

## 2021-03-21 DIAGNOSIS — R2689 Other abnormalities of gait and mobility: Secondary | ICD-10-CM | POA: Diagnosis not present

## 2021-03-21 DIAGNOSIS — R569 Unspecified convulsions: Secondary | ICD-10-CM | POA: Diagnosis not present

## 2021-03-23 DIAGNOSIS — F0390 Unspecified dementia without behavioral disturbance: Secondary | ICD-10-CM | POA: Diagnosis not present

## 2021-03-23 DIAGNOSIS — R2689 Other abnormalities of gait and mobility: Secondary | ICD-10-CM | POA: Diagnosis not present

## 2021-03-23 DIAGNOSIS — G2111 Neuroleptic induced parkinsonism: Secondary | ICD-10-CM | POA: Diagnosis not present

## 2021-03-23 DIAGNOSIS — G40909 Epilepsy, unspecified, not intractable, without status epilepticus: Secondary | ICD-10-CM | POA: Diagnosis not present

## 2021-03-23 DIAGNOSIS — I10 Essential (primary) hypertension: Secondary | ICD-10-CM | POA: Diagnosis not present

## 2021-03-26 DIAGNOSIS — R2689 Other abnormalities of gait and mobility: Secondary | ICD-10-CM | POA: Diagnosis not present

## 2021-03-26 DIAGNOSIS — G40909 Epilepsy, unspecified, not intractable, without status epilepticus: Secondary | ICD-10-CM | POA: Diagnosis not present

## 2021-03-26 DIAGNOSIS — F0390 Unspecified dementia without behavioral disturbance: Secondary | ICD-10-CM | POA: Diagnosis not present

## 2021-03-26 DIAGNOSIS — G2111 Neuroleptic induced parkinsonism: Secondary | ICD-10-CM | POA: Diagnosis not present

## 2021-03-26 DIAGNOSIS — I10 Essential (primary) hypertension: Secondary | ICD-10-CM | POA: Diagnosis not present

## 2021-03-27 DIAGNOSIS — Z20828 Contact with and (suspected) exposure to other viral communicable diseases: Secondary | ICD-10-CM | POA: Diagnosis not present

## 2021-03-27 DIAGNOSIS — I10 Essential (primary) hypertension: Secondary | ICD-10-CM | POA: Diagnosis not present

## 2021-03-27 DIAGNOSIS — G40909 Epilepsy, unspecified, not intractable, without status epilepticus: Secondary | ICD-10-CM | POA: Diagnosis not present

## 2021-03-27 DIAGNOSIS — G2111 Neuroleptic induced parkinsonism: Secondary | ICD-10-CM | POA: Diagnosis not present

## 2021-03-27 DIAGNOSIS — R2689 Other abnormalities of gait and mobility: Secondary | ICD-10-CM | POA: Diagnosis not present

## 2021-03-27 DIAGNOSIS — F0390 Unspecified dementia without behavioral disturbance: Secondary | ICD-10-CM | POA: Diagnosis not present

## 2021-03-27 DIAGNOSIS — U071 COVID-19: Secondary | ICD-10-CM | POA: Diagnosis not present

## 2021-03-28 DIAGNOSIS — F0390 Unspecified dementia without behavioral disturbance: Secondary | ICD-10-CM | POA: Diagnosis not present

## 2021-03-28 DIAGNOSIS — R2689 Other abnormalities of gait and mobility: Secondary | ICD-10-CM | POA: Diagnosis not present

## 2021-03-28 DIAGNOSIS — I10 Essential (primary) hypertension: Secondary | ICD-10-CM | POA: Diagnosis not present

## 2021-03-28 DIAGNOSIS — G40909 Epilepsy, unspecified, not intractable, without status epilepticus: Secondary | ICD-10-CM | POA: Diagnosis not present

## 2021-03-28 DIAGNOSIS — G2111 Neuroleptic induced parkinsonism: Secondary | ICD-10-CM | POA: Diagnosis not present

## 2021-04-03 DIAGNOSIS — M79674 Pain in right toe(s): Secondary | ICD-10-CM | POA: Diagnosis not present

## 2021-04-03 DIAGNOSIS — M79675 Pain in left toe(s): Secondary | ICD-10-CM | POA: Diagnosis not present

## 2021-04-03 DIAGNOSIS — U071 COVID-19: Secondary | ICD-10-CM | POA: Diagnosis not present

## 2021-04-03 DIAGNOSIS — Z20828 Contact with and (suspected) exposure to other viral communicable diseases: Secondary | ICD-10-CM | POA: Diagnosis not present

## 2021-04-03 DIAGNOSIS — B351 Tinea unguium: Secondary | ICD-10-CM | POA: Diagnosis not present

## 2021-04-09 DIAGNOSIS — G2111 Neuroleptic induced parkinsonism: Secondary | ICD-10-CM | POA: Diagnosis not present

## 2021-04-09 DIAGNOSIS — I10 Essential (primary) hypertension: Secondary | ICD-10-CM | POA: Diagnosis not present

## 2021-04-09 DIAGNOSIS — R2689 Other abnormalities of gait and mobility: Secondary | ICD-10-CM | POA: Diagnosis not present

## 2021-04-09 DIAGNOSIS — G40909 Epilepsy, unspecified, not intractable, without status epilepticus: Secondary | ICD-10-CM | POA: Diagnosis not present

## 2021-04-09 DIAGNOSIS — F0390 Unspecified dementia without behavioral disturbance: Secondary | ICD-10-CM | POA: Diagnosis not present

## 2021-04-10 ENCOUNTER — Ambulatory Visit (INDEPENDENT_AMBULATORY_CARE_PROVIDER_SITE_OTHER): Payer: Medicare Other | Admitting: Orthopedic Surgery

## 2021-04-10 ENCOUNTER — Other Ambulatory Visit: Payer: Self-pay

## 2021-04-10 ENCOUNTER — Encounter: Payer: Self-pay | Admitting: Orthopedic Surgery

## 2021-04-10 VITALS — BP 170/95 | HR 76 | Ht 62.0 in | Wt 195.0 lb

## 2021-04-10 DIAGNOSIS — M170 Bilateral primary osteoarthritis of knee: Secondary | ICD-10-CM

## 2021-04-10 NOTE — Patient Instructions (Signed)

## 2021-04-10 NOTE — Progress Notes (Signed)
Orthopaedic Clinic Return  Assessment: Stephanie Vincent is a 65 y.o. female with the following: Bilateral knee arthritis  Plan: Stephanie Vincent continues to have pain in both of her knees.  She previously had a good response, including pain control from an injection in her right knee.  As result, she would like to proceed with bilateral knee injections.  Last injection 6 months ago.  Medications needed.  Activities as tolerated.  Follow-up as needed.  Procedure note injection Left knee joint   Verbal consent was obtained to inject the left knee joint  Timeout was completed to confirm the site of injection.  The skin was prepped with alcohol and ethyl chloride was sprayed at the injection site.  A 21-gauge needle was used to inject 40 mg of Depo-Medrol and 1% lidocaine (3 cc) into the left knee using an anterolateral approach.  There were no complications. A sterile bandage was applied.   Procedure note injection Right knee joint   Verbal consent was obtained to inject the right knee joint  Timeout was completed to confirm the site of injection.  The skin was prepped with alcohol and ethyl chloride was sprayed at the injection site.  A 21-gauge needle was used to inject 40 mg of Depo-Medrol and 1% lidocaine (3 cc) into the right knee using an anterolateral approach.  There were no complications. A sterile bandage was applied.    Follow-up: Return if symptoms worsen or fail to improve.   Subjective:  Chief Complaint  Patient presents with   Knee Pain    Bilateral     History of Present Illness: Stephanie Vincent is a 65 y.o. female who returns to clinic for repeat evaluation of her knee pain.  She was last seen in November, 2021, at which time she was complaining of right knee pain.  She received a steroid injection, which improved her symptoms for several months.  Over the past 1-2 months, she has noted progressive worsening of pain in both knees.  According to records from the  assisted living facility, she is not taking any medications on a consistent basis for knee pain.  She notices swelling on a regular basis.  Review of Systems: No fevers or chills No numbness or tingling No chest pain No shortness of breath No bowel or bladder dysfunction No GI distress No headaches   Objective: BP (!) 170/95   Pulse 76   Ht 5\' 2"  (1.575 m)   Wt 195 lb (88.5 kg)   BMI 35.67 kg/m   Physical Exam:  Alert and oriented.  No acute distress.  Evaluation of bilateral knees demonstrates moderate effusions.  She has diffuse tenderness to palpation throughout the knee.  She can achieve full extension, flexion beyond 90 degrees.  No obvious instability, although physical exam is limited due to her mentation.  IMAGING: I personally ordered and reviewed the following images:  No new imaging obtained today.  , MD 04/10/2021 2:16 PM

## 2021-04-11 DIAGNOSIS — G2111 Neuroleptic induced parkinsonism: Secondary | ICD-10-CM | POA: Diagnosis not present

## 2021-04-11 DIAGNOSIS — G40909 Epilepsy, unspecified, not intractable, without status epilepticus: Secondary | ICD-10-CM | POA: Diagnosis not present

## 2021-04-11 DIAGNOSIS — U071 COVID-19: Secondary | ICD-10-CM | POA: Diagnosis not present

## 2021-04-11 DIAGNOSIS — Z20828 Contact with and (suspected) exposure to other viral communicable diseases: Secondary | ICD-10-CM | POA: Diagnosis not present

## 2021-04-11 DIAGNOSIS — I10 Essential (primary) hypertension: Secondary | ICD-10-CM | POA: Diagnosis not present

## 2021-04-11 DIAGNOSIS — F0390 Unspecified dementia without behavioral disturbance: Secondary | ICD-10-CM | POA: Diagnosis not present

## 2021-04-11 DIAGNOSIS — R2689 Other abnormalities of gait and mobility: Secondary | ICD-10-CM | POA: Diagnosis not present

## 2021-04-12 DIAGNOSIS — I10 Essential (primary) hypertension: Secondary | ICD-10-CM | POA: Diagnosis not present

## 2021-04-12 DIAGNOSIS — G40909 Epilepsy, unspecified, not intractable, without status epilepticus: Secondary | ICD-10-CM | POA: Diagnosis not present

## 2021-04-12 DIAGNOSIS — F0390 Unspecified dementia without behavioral disturbance: Secondary | ICD-10-CM | POA: Diagnosis not present

## 2021-04-12 DIAGNOSIS — G2111 Neuroleptic induced parkinsonism: Secondary | ICD-10-CM | POA: Diagnosis not present

## 2021-04-12 DIAGNOSIS — R2689 Other abnormalities of gait and mobility: Secondary | ICD-10-CM | POA: Diagnosis not present

## 2021-04-16 DIAGNOSIS — Z20828 Contact with and (suspected) exposure to other viral communicable diseases: Secondary | ICD-10-CM | POA: Diagnosis not present

## 2021-04-16 DIAGNOSIS — R2689 Other abnormalities of gait and mobility: Secondary | ICD-10-CM | POA: Diagnosis not present

## 2021-04-16 DIAGNOSIS — U071 COVID-19: Secondary | ICD-10-CM | POA: Diagnosis not present

## 2021-04-16 DIAGNOSIS — I10 Essential (primary) hypertension: Secondary | ICD-10-CM | POA: Diagnosis not present

## 2021-04-16 DIAGNOSIS — F0390 Unspecified dementia without behavioral disturbance: Secondary | ICD-10-CM | POA: Diagnosis not present

## 2021-04-16 DIAGNOSIS — G2111 Neuroleptic induced parkinsonism: Secondary | ICD-10-CM | POA: Diagnosis not present

## 2021-04-16 DIAGNOSIS — G40909 Epilepsy, unspecified, not intractable, without status epilepticus: Secondary | ICD-10-CM | POA: Diagnosis not present

## 2021-04-17 DIAGNOSIS — I10 Essential (primary) hypertension: Secondary | ICD-10-CM | POA: Diagnosis not present

## 2021-04-17 DIAGNOSIS — G2111 Neuroleptic induced parkinsonism: Secondary | ICD-10-CM | POA: Diagnosis not present

## 2021-04-17 DIAGNOSIS — G40909 Epilepsy, unspecified, not intractable, without status epilepticus: Secondary | ICD-10-CM | POA: Diagnosis not present

## 2021-04-17 DIAGNOSIS — R2689 Other abnormalities of gait and mobility: Secondary | ICD-10-CM | POA: Diagnosis not present

## 2021-04-17 DIAGNOSIS — F0390 Unspecified dementia without behavioral disturbance: Secondary | ICD-10-CM | POA: Diagnosis not present

## 2021-04-18 DIAGNOSIS — F0390 Unspecified dementia without behavioral disturbance: Secondary | ICD-10-CM | POA: Diagnosis not present

## 2021-04-18 DIAGNOSIS — G40909 Epilepsy, unspecified, not intractable, without status epilepticus: Secondary | ICD-10-CM | POA: Diagnosis not present

## 2021-04-18 DIAGNOSIS — I10 Essential (primary) hypertension: Secondary | ICD-10-CM | POA: Diagnosis not present

## 2021-04-18 DIAGNOSIS — R2689 Other abnormalities of gait and mobility: Secondary | ICD-10-CM | POA: Diagnosis not present

## 2021-04-18 DIAGNOSIS — G2111 Neuroleptic induced parkinsonism: Secondary | ICD-10-CM | POA: Diagnosis not present

## 2021-04-21 DIAGNOSIS — I10 Essential (primary) hypertension: Secondary | ICD-10-CM | POA: Diagnosis not present

## 2021-04-21 DIAGNOSIS — F209 Schizophrenia, unspecified: Secondary | ICD-10-CM | POA: Diagnosis not present

## 2021-04-22 DIAGNOSIS — G2111 Neuroleptic induced parkinsonism: Secondary | ICD-10-CM | POA: Diagnosis not present

## 2021-04-22 DIAGNOSIS — R2689 Other abnormalities of gait and mobility: Secondary | ICD-10-CM | POA: Diagnosis not present

## 2021-04-22 DIAGNOSIS — F0390 Unspecified dementia without behavioral disturbance: Secondary | ICD-10-CM | POA: Diagnosis not present

## 2021-04-22 DIAGNOSIS — I10 Essential (primary) hypertension: Secondary | ICD-10-CM | POA: Diagnosis not present

## 2021-04-22 DIAGNOSIS — G40909 Epilepsy, unspecified, not intractable, without status epilepticus: Secondary | ICD-10-CM | POA: Diagnosis not present

## 2021-04-24 DIAGNOSIS — R2689 Other abnormalities of gait and mobility: Secondary | ICD-10-CM | POA: Diagnosis not present

## 2021-04-24 DIAGNOSIS — F0390 Unspecified dementia without behavioral disturbance: Secondary | ICD-10-CM | POA: Diagnosis not present

## 2021-04-24 DIAGNOSIS — G40909 Epilepsy, unspecified, not intractable, without status epilepticus: Secondary | ICD-10-CM | POA: Diagnosis not present

## 2021-04-24 DIAGNOSIS — G2111 Neuroleptic induced parkinsonism: Secondary | ICD-10-CM | POA: Diagnosis not present

## 2021-04-24 DIAGNOSIS — I10 Essential (primary) hypertension: Secondary | ICD-10-CM | POA: Diagnosis not present

## 2021-04-25 DIAGNOSIS — G2111 Neuroleptic induced parkinsonism: Secondary | ICD-10-CM | POA: Diagnosis not present

## 2021-04-25 DIAGNOSIS — I10 Essential (primary) hypertension: Secondary | ICD-10-CM | POA: Diagnosis not present

## 2021-04-25 DIAGNOSIS — F0390 Unspecified dementia without behavioral disturbance: Secondary | ICD-10-CM | POA: Diagnosis not present

## 2021-04-25 DIAGNOSIS — R2689 Other abnormalities of gait and mobility: Secondary | ICD-10-CM | POA: Diagnosis not present

## 2021-04-25 DIAGNOSIS — G40909 Epilepsy, unspecified, not intractable, without status epilepticus: Secondary | ICD-10-CM | POA: Diagnosis not present

## 2021-04-26 DIAGNOSIS — F0390 Unspecified dementia without behavioral disturbance: Secondary | ICD-10-CM | POA: Diagnosis not present

## 2021-04-26 DIAGNOSIS — G40909 Epilepsy, unspecified, not intractable, without status epilepticus: Secondary | ICD-10-CM | POA: Diagnosis not present

## 2021-04-26 DIAGNOSIS — R2689 Other abnormalities of gait and mobility: Secondary | ICD-10-CM | POA: Diagnosis not present

## 2021-04-26 DIAGNOSIS — G2111 Neuroleptic induced parkinsonism: Secondary | ICD-10-CM | POA: Diagnosis not present

## 2021-04-26 DIAGNOSIS — I10 Essential (primary) hypertension: Secondary | ICD-10-CM | POA: Diagnosis not present

## 2021-04-30 DIAGNOSIS — I10 Essential (primary) hypertension: Secondary | ICD-10-CM | POA: Diagnosis not present

## 2021-04-30 DIAGNOSIS — R2689 Other abnormalities of gait and mobility: Secondary | ICD-10-CM | POA: Diagnosis not present

## 2021-04-30 DIAGNOSIS — G40909 Epilepsy, unspecified, not intractable, without status epilepticus: Secondary | ICD-10-CM | POA: Diagnosis not present

## 2021-04-30 DIAGNOSIS — G2111 Neuroleptic induced parkinsonism: Secondary | ICD-10-CM | POA: Diagnosis not present

## 2021-04-30 DIAGNOSIS — F0390 Unspecified dementia without behavioral disturbance: Secondary | ICD-10-CM | POA: Diagnosis not present

## 2021-05-02 DIAGNOSIS — R2689 Other abnormalities of gait and mobility: Secondary | ICD-10-CM | POA: Diagnosis not present

## 2021-05-02 DIAGNOSIS — G40909 Epilepsy, unspecified, not intractable, without status epilepticus: Secondary | ICD-10-CM | POA: Diagnosis not present

## 2021-05-02 DIAGNOSIS — F0390 Unspecified dementia without behavioral disturbance: Secondary | ICD-10-CM | POA: Diagnosis not present

## 2021-05-02 DIAGNOSIS — G2111 Neuroleptic induced parkinsonism: Secondary | ICD-10-CM | POA: Diagnosis not present

## 2021-05-02 DIAGNOSIS — I10 Essential (primary) hypertension: Secondary | ICD-10-CM | POA: Diagnosis not present

## 2021-05-07 DIAGNOSIS — R2689 Other abnormalities of gait and mobility: Secondary | ICD-10-CM | POA: Diagnosis not present

## 2021-05-07 DIAGNOSIS — G2111 Neuroleptic induced parkinsonism: Secondary | ICD-10-CM | POA: Diagnosis not present

## 2021-05-07 DIAGNOSIS — I10 Essential (primary) hypertension: Secondary | ICD-10-CM | POA: Diagnosis not present

## 2021-05-07 DIAGNOSIS — G40909 Epilepsy, unspecified, not intractable, without status epilepticus: Secondary | ICD-10-CM | POA: Diagnosis not present

## 2021-05-07 DIAGNOSIS — F0390 Unspecified dementia without behavioral disturbance: Secondary | ICD-10-CM | POA: Diagnosis not present

## 2021-05-08 DIAGNOSIS — R2689 Other abnormalities of gait and mobility: Secondary | ICD-10-CM | POA: Diagnosis not present

## 2021-05-08 DIAGNOSIS — I10 Essential (primary) hypertension: Secondary | ICD-10-CM | POA: Diagnosis not present

## 2021-05-08 DIAGNOSIS — G40909 Epilepsy, unspecified, not intractable, without status epilepticus: Secondary | ICD-10-CM | POA: Diagnosis not present

## 2021-05-08 DIAGNOSIS — F0390 Unspecified dementia without behavioral disturbance: Secondary | ICD-10-CM | POA: Diagnosis not present

## 2021-05-08 DIAGNOSIS — G2111 Neuroleptic induced parkinsonism: Secondary | ICD-10-CM | POA: Diagnosis not present

## 2021-05-14 DIAGNOSIS — I10 Essential (primary) hypertension: Secondary | ICD-10-CM | POA: Diagnosis not present

## 2021-05-14 DIAGNOSIS — G2111 Neuroleptic induced parkinsonism: Secondary | ICD-10-CM | POA: Diagnosis not present

## 2021-05-14 DIAGNOSIS — G40909 Epilepsy, unspecified, not intractable, without status epilepticus: Secondary | ICD-10-CM | POA: Diagnosis not present

## 2021-05-14 DIAGNOSIS — R2689 Other abnormalities of gait and mobility: Secondary | ICD-10-CM | POA: Diagnosis not present

## 2021-05-14 DIAGNOSIS — F0390 Unspecified dementia without behavioral disturbance: Secondary | ICD-10-CM | POA: Diagnosis not present

## 2021-05-16 DIAGNOSIS — F0390 Unspecified dementia without behavioral disturbance: Secondary | ICD-10-CM | POA: Diagnosis not present

## 2021-05-16 DIAGNOSIS — R2689 Other abnormalities of gait and mobility: Secondary | ICD-10-CM | POA: Diagnosis not present

## 2021-05-16 DIAGNOSIS — G2111 Neuroleptic induced parkinsonism: Secondary | ICD-10-CM | POA: Diagnosis not present

## 2021-05-16 DIAGNOSIS — G40909 Epilepsy, unspecified, not intractable, without status epilepticus: Secondary | ICD-10-CM | POA: Diagnosis not present

## 2021-05-16 DIAGNOSIS — I10 Essential (primary) hypertension: Secondary | ICD-10-CM | POA: Diagnosis not present

## 2021-05-21 DIAGNOSIS — I10 Essential (primary) hypertension: Secondary | ICD-10-CM | POA: Diagnosis not present

## 2021-05-21 DIAGNOSIS — F209 Schizophrenia, unspecified: Secondary | ICD-10-CM | POA: Diagnosis not present

## 2021-05-23 DIAGNOSIS — Z23 Encounter for immunization: Secondary | ICD-10-CM | POA: Diagnosis not present

## 2021-06-12 DIAGNOSIS — M79674 Pain in right toe(s): Secondary | ICD-10-CM | POA: Diagnosis not present

## 2021-06-12 DIAGNOSIS — M79675 Pain in left toe(s): Secondary | ICD-10-CM | POA: Diagnosis not present

## 2021-06-12 DIAGNOSIS — B351 Tinea unguium: Secondary | ICD-10-CM | POA: Diagnosis not present

## 2021-06-13 ENCOUNTER — Telehealth: Payer: Self-pay | Admitting: Orthopedic Surgery

## 2021-06-13 NOTE — Telephone Encounter (Signed)
Called back to facility. Left message (sent fax also to [facility]se, fax [phone] to offer appointment)

## 2021-06-13 NOTE — Telephone Encounter (Signed)
Call received from [facility] facility, per Nettie Elm - ph# [phone] - said patient would like to be seen again for knee injections again; said last ones did not help too much. Last visit was 04/10/21. Please advise if okay to schedule at this time, or if other advice?

## 2021-06-13 NOTE — Telephone Encounter (Signed)
It's ok to schedule patient for a reevaluation. She will not be able to get another injection but we may need to get her a new treatment plan.

## 2021-06-15 NOTE — Telephone Encounter (Signed)
Called back to facility to schedule; done.

## 2021-06-20 DIAGNOSIS — R569 Unspecified convulsions: Secondary | ICD-10-CM | POA: Diagnosis not present

## 2021-06-20 DIAGNOSIS — F70 Mild intellectual disabilities: Secondary | ICD-10-CM | POA: Diagnosis not present

## 2021-06-20 DIAGNOSIS — Z79899 Other long term (current) drug therapy: Secondary | ICD-10-CM | POA: Diagnosis not present

## 2021-06-20 DIAGNOSIS — G2111 Neuroleptic induced parkinsonism: Secondary | ICD-10-CM | POA: Diagnosis not present

## 2021-06-20 DIAGNOSIS — R2689 Other abnormalities of gait and mobility: Secondary | ICD-10-CM | POA: Diagnosis not present

## 2021-06-20 DIAGNOSIS — I10 Essential (primary) hypertension: Secondary | ICD-10-CM | POA: Diagnosis not present

## 2021-06-20 DIAGNOSIS — M130 Polyarthritis, unspecified: Secondary | ICD-10-CM | POA: Diagnosis not present

## 2021-06-21 DIAGNOSIS — I10 Essential (primary) hypertension: Secondary | ICD-10-CM | POA: Diagnosis not present

## 2021-06-21 DIAGNOSIS — F209 Schizophrenia, unspecified: Secondary | ICD-10-CM | POA: Diagnosis not present

## 2021-06-26 ENCOUNTER — Encounter: Payer: Self-pay | Admitting: Orthopedic Surgery

## 2021-06-26 ENCOUNTER — Ambulatory Visit (INDEPENDENT_AMBULATORY_CARE_PROVIDER_SITE_OTHER): Payer: Medicare Other | Admitting: Orthopedic Surgery

## 2021-06-26 ENCOUNTER — Other Ambulatory Visit: Payer: Self-pay

## 2021-06-26 VITALS — BP 145/94 | HR 89 | Ht 62.0 in | Wt 197.0 lb

## 2021-06-26 DIAGNOSIS — M170 Bilateral primary osteoarthritis of knee: Secondary | ICD-10-CM | POA: Diagnosis not present

## 2021-06-26 NOTE — Progress Notes (Signed)
Orthopaedic Clinic Return  Assessment: Stephanie Vincent is a 65 y.o. female with the following: Bilateral knee arthritis  Plan: Patient continues to have pain in bilateral knees.  No interval injuries.  She states that the most recent injection did not improve her symptoms.  As a result, I am reluctant to repeat injections today.  We have not worked with physical therapy, this is available at her assisted living facility.  Provided a referral for physical therapy.  Also provided directions for ibuprofen, 600 mg every 6 hours as needed.  Follow-up as needed.   Follow-up: Return if symptoms worsen or fail to improve.   Subjective:  Chief Complaint  Patient presents with   Knee Pain    Bilat knee pain no better after injections 04/10/21    History of Present Illness: Stephanie Vincent is a 65 y.o. female who returns to clinic for repeat evaluation of her knee pain.  I have seen her multiple times for bilateral knee pain.  No injuries have been sustained since I last saw her.  The first round of injections provided 1-2 months of relief of her pain.  However, she does not feel as though the most recent injections have improved her symptoms.  Review of Systems: No fevers or chills No numbness or tingling No chest pain No shortness of breath No bowel or bladder dysfunction No GI distress No headaches   Objective: BP (!) 145/94   Pulse 89   Ht 5\' 2"  (1.575 m)   Wt 197 lb (89.4 kg)   BMI 36.03 kg/m   Physical Exam:  Alert and oriented.  No acute distress.  Evaluation of bilateral knees demonstrates moderate effusions.  She has diffuse tenderness to palpation throughout the knee.  She can achieve full extension, flexion beyond 90 degrees.  No obvious instability, although physical exam is limited due to her mentation.  IMAGING: I personally ordered and reviewed the following images:  No new imaging obtained today.  , MD 06/26/2021 10:56 PM

## 2021-06-28 DIAGNOSIS — F429 Obsessive-compulsive disorder, unspecified: Secondary | ICD-10-CM | POA: Diagnosis not present

## 2021-06-28 DIAGNOSIS — M25561 Pain in right knee: Secondary | ICD-10-CM | POA: Diagnosis not present

## 2021-06-28 DIAGNOSIS — E669 Obesity, unspecified: Secondary | ICD-10-CM | POA: Diagnosis not present

## 2021-06-28 DIAGNOSIS — M25562 Pain in left knee: Secondary | ICD-10-CM | POA: Diagnosis not present

## 2021-06-28 DIAGNOSIS — Z9181 History of falling: Secondary | ICD-10-CM | POA: Diagnosis not present

## 2021-06-28 DIAGNOSIS — R569 Unspecified convulsions: Secondary | ICD-10-CM | POA: Diagnosis not present

## 2021-06-28 DIAGNOSIS — F0390 Unspecified dementia without behavioral disturbance: Secondary | ICD-10-CM | POA: Diagnosis not present

## 2021-06-28 DIAGNOSIS — K219 Gastro-esophageal reflux disease without esophagitis: Secondary | ICD-10-CM | POA: Diagnosis not present

## 2021-06-28 DIAGNOSIS — M170 Bilateral primary osteoarthritis of knee: Secondary | ICD-10-CM | POA: Diagnosis not present

## 2021-07-02 DIAGNOSIS — F0390 Unspecified dementia without behavioral disturbance: Secondary | ICD-10-CM | POA: Diagnosis not present

## 2021-07-02 DIAGNOSIS — M25562 Pain in left knee: Secondary | ICD-10-CM | POA: Diagnosis not present

## 2021-07-02 DIAGNOSIS — F429 Obsessive-compulsive disorder, unspecified: Secondary | ICD-10-CM | POA: Diagnosis not present

## 2021-07-02 DIAGNOSIS — R569 Unspecified convulsions: Secondary | ICD-10-CM | POA: Diagnosis not present

## 2021-07-02 DIAGNOSIS — M170 Bilateral primary osteoarthritis of knee: Secondary | ICD-10-CM | POA: Diagnosis not present

## 2021-07-02 DIAGNOSIS — M25561 Pain in right knee: Secondary | ICD-10-CM | POA: Diagnosis not present

## 2021-07-03 DIAGNOSIS — F0390 Unspecified dementia without behavioral disturbance: Secondary | ICD-10-CM | POA: Diagnosis not present

## 2021-07-03 DIAGNOSIS — M170 Bilateral primary osteoarthritis of knee: Secondary | ICD-10-CM | POA: Diagnosis not present

## 2021-07-03 DIAGNOSIS — M25561 Pain in right knee: Secondary | ICD-10-CM | POA: Diagnosis not present

## 2021-07-03 DIAGNOSIS — R569 Unspecified convulsions: Secondary | ICD-10-CM | POA: Diagnosis not present

## 2021-07-03 DIAGNOSIS — F429 Obsessive-compulsive disorder, unspecified: Secondary | ICD-10-CM | POA: Diagnosis not present

## 2021-07-03 DIAGNOSIS — M25562 Pain in left knee: Secondary | ICD-10-CM | POA: Diagnosis not present

## 2021-07-04 DIAGNOSIS — M25562 Pain in left knee: Secondary | ICD-10-CM | POA: Diagnosis not present

## 2021-07-04 DIAGNOSIS — R569 Unspecified convulsions: Secondary | ICD-10-CM | POA: Diagnosis not present

## 2021-07-04 DIAGNOSIS — F0390 Unspecified dementia without behavioral disturbance: Secondary | ICD-10-CM | POA: Diagnosis not present

## 2021-07-04 DIAGNOSIS — M170 Bilateral primary osteoarthritis of knee: Secondary | ICD-10-CM | POA: Diagnosis not present

## 2021-07-04 DIAGNOSIS — F429 Obsessive-compulsive disorder, unspecified: Secondary | ICD-10-CM | POA: Diagnosis not present

## 2021-07-04 DIAGNOSIS — M25561 Pain in right knee: Secondary | ICD-10-CM | POA: Diagnosis not present

## 2021-07-18 DIAGNOSIS — M25562 Pain in left knee: Secondary | ICD-10-CM | POA: Diagnosis not present

## 2021-07-18 DIAGNOSIS — M25561 Pain in right knee: Secondary | ICD-10-CM | POA: Diagnosis not present

## 2021-07-18 DIAGNOSIS — M170 Bilateral primary osteoarthritis of knee: Secondary | ICD-10-CM | POA: Diagnosis not present

## 2021-07-18 DIAGNOSIS — F429 Obsessive-compulsive disorder, unspecified: Secondary | ICD-10-CM | POA: Diagnosis not present

## 2021-07-18 DIAGNOSIS — F0390 Unspecified dementia without behavioral disturbance: Secondary | ICD-10-CM | POA: Diagnosis not present

## 2021-07-18 DIAGNOSIS — R569 Unspecified convulsions: Secondary | ICD-10-CM | POA: Diagnosis not present

## 2021-07-19 DIAGNOSIS — F429 Obsessive-compulsive disorder, unspecified: Secondary | ICD-10-CM | POA: Diagnosis not present

## 2021-07-19 DIAGNOSIS — M25562 Pain in left knee: Secondary | ICD-10-CM | POA: Diagnosis not present

## 2021-07-19 DIAGNOSIS — M25561 Pain in right knee: Secondary | ICD-10-CM | POA: Diagnosis not present

## 2021-07-19 DIAGNOSIS — M170 Bilateral primary osteoarthritis of knee: Secondary | ICD-10-CM | POA: Diagnosis not present

## 2021-07-19 DIAGNOSIS — F0390 Unspecified dementia without behavioral disturbance: Secondary | ICD-10-CM | POA: Diagnosis not present

## 2021-07-19 DIAGNOSIS — R569 Unspecified convulsions: Secondary | ICD-10-CM | POA: Diagnosis not present

## 2021-07-19 DIAGNOSIS — N390 Urinary tract infection, site not specified: Secondary | ICD-10-CM | POA: Diagnosis not present

## 2021-07-20 DIAGNOSIS — F0390 Unspecified dementia without behavioral disturbance: Secondary | ICD-10-CM | POA: Diagnosis not present

## 2021-07-20 DIAGNOSIS — M170 Bilateral primary osteoarthritis of knee: Secondary | ICD-10-CM | POA: Diagnosis not present

## 2021-07-20 DIAGNOSIS — F429 Obsessive-compulsive disorder, unspecified: Secondary | ICD-10-CM | POA: Diagnosis not present

## 2021-07-20 DIAGNOSIS — M25561 Pain in right knee: Secondary | ICD-10-CM | POA: Diagnosis not present

## 2021-07-20 DIAGNOSIS — M25562 Pain in left knee: Secondary | ICD-10-CM | POA: Diagnosis not present

## 2021-07-20 DIAGNOSIS — R569 Unspecified convulsions: Secondary | ICD-10-CM | POA: Diagnosis not present

## 2021-07-23 DIAGNOSIS — M25561 Pain in right knee: Secondary | ICD-10-CM | POA: Diagnosis not present

## 2021-07-23 DIAGNOSIS — F0390 Unspecified dementia without behavioral disturbance: Secondary | ICD-10-CM | POA: Diagnosis not present

## 2021-07-23 DIAGNOSIS — G40909 Epilepsy, unspecified, not intractable, without status epilepticus: Secondary | ICD-10-CM | POA: Diagnosis not present

## 2021-07-23 DIAGNOSIS — R569 Unspecified convulsions: Secondary | ICD-10-CM | POA: Diagnosis not present

## 2021-07-23 DIAGNOSIS — Z1389 Encounter for screening for other disorder: Secondary | ICD-10-CM | POA: Diagnosis not present

## 2021-07-23 DIAGNOSIS — M25562 Pain in left knee: Secondary | ICD-10-CM | POA: Diagnosis not present

## 2021-07-23 DIAGNOSIS — M170 Bilateral primary osteoarthritis of knee: Secondary | ICD-10-CM | POA: Diagnosis not present

## 2021-07-23 DIAGNOSIS — Z1331 Encounter for screening for depression: Secondary | ICD-10-CM | POA: Diagnosis not present

## 2021-07-23 DIAGNOSIS — F339 Major depressive disorder, recurrent, unspecified: Secondary | ICD-10-CM | POA: Diagnosis not present

## 2021-07-23 DIAGNOSIS — F429 Obsessive-compulsive disorder, unspecified: Secondary | ICD-10-CM | POA: Diagnosis not present

## 2021-07-23 DIAGNOSIS — F209 Schizophrenia, unspecified: Secondary | ICD-10-CM | POA: Diagnosis not present

## 2021-07-23 DIAGNOSIS — I10 Essential (primary) hypertension: Secondary | ICD-10-CM | POA: Diagnosis not present

## 2021-07-24 DIAGNOSIS — I10 Essential (primary) hypertension: Secondary | ICD-10-CM | POA: Diagnosis not present

## 2021-07-25 DIAGNOSIS — M170 Bilateral primary osteoarthritis of knee: Secondary | ICD-10-CM | POA: Diagnosis not present

## 2021-07-25 DIAGNOSIS — M25561 Pain in right knee: Secondary | ICD-10-CM | POA: Diagnosis not present

## 2021-07-25 DIAGNOSIS — R569 Unspecified convulsions: Secondary | ICD-10-CM | POA: Diagnosis not present

## 2021-07-25 DIAGNOSIS — F0390 Unspecified dementia without behavioral disturbance: Secondary | ICD-10-CM | POA: Diagnosis not present

## 2021-07-25 DIAGNOSIS — M25562 Pain in left knee: Secondary | ICD-10-CM | POA: Diagnosis not present

## 2021-07-25 DIAGNOSIS — F429 Obsessive-compulsive disorder, unspecified: Secondary | ICD-10-CM | POA: Diagnosis not present

## 2021-07-26 DIAGNOSIS — F0390 Unspecified dementia without behavioral disturbance: Secondary | ICD-10-CM | POA: Diagnosis not present

## 2021-07-26 DIAGNOSIS — M25561 Pain in right knee: Secondary | ICD-10-CM | POA: Diagnosis not present

## 2021-07-26 DIAGNOSIS — M25562 Pain in left knee: Secondary | ICD-10-CM | POA: Diagnosis not present

## 2021-07-26 DIAGNOSIS — R569 Unspecified convulsions: Secondary | ICD-10-CM | POA: Diagnosis not present

## 2021-07-26 DIAGNOSIS — F429 Obsessive-compulsive disorder, unspecified: Secondary | ICD-10-CM | POA: Diagnosis not present

## 2021-07-26 DIAGNOSIS — M170 Bilateral primary osteoarthritis of knee: Secondary | ICD-10-CM | POA: Diagnosis not present

## 2021-07-28 DIAGNOSIS — E669 Obesity, unspecified: Secondary | ICD-10-CM | POA: Diagnosis not present

## 2021-07-28 DIAGNOSIS — F0390 Unspecified dementia without behavioral disturbance: Secondary | ICD-10-CM | POA: Diagnosis not present

## 2021-07-28 DIAGNOSIS — Z9181 History of falling: Secondary | ICD-10-CM | POA: Diagnosis not present

## 2021-07-28 DIAGNOSIS — R569 Unspecified convulsions: Secondary | ICD-10-CM | POA: Diagnosis not present

## 2021-07-28 DIAGNOSIS — M25561 Pain in right knee: Secondary | ICD-10-CM | POA: Diagnosis not present

## 2021-07-28 DIAGNOSIS — K219 Gastro-esophageal reflux disease without esophagitis: Secondary | ICD-10-CM | POA: Diagnosis not present

## 2021-07-28 DIAGNOSIS — M25562 Pain in left knee: Secondary | ICD-10-CM | POA: Diagnosis not present

## 2021-07-28 DIAGNOSIS — F429 Obsessive-compulsive disorder, unspecified: Secondary | ICD-10-CM | POA: Diagnosis not present

## 2021-07-28 DIAGNOSIS — M170 Bilateral primary osteoarthritis of knee: Secondary | ICD-10-CM | POA: Diagnosis not present

## 2021-07-30 DIAGNOSIS — M170 Bilateral primary osteoarthritis of knee: Secondary | ICD-10-CM | POA: Diagnosis not present

## 2021-07-30 DIAGNOSIS — M25561 Pain in right knee: Secondary | ICD-10-CM | POA: Diagnosis not present

## 2021-07-30 DIAGNOSIS — M25562 Pain in left knee: Secondary | ICD-10-CM | POA: Diagnosis not present

## 2021-07-30 DIAGNOSIS — F0390 Unspecified dementia without behavioral disturbance: Secondary | ICD-10-CM | POA: Diagnosis not present

## 2021-07-30 DIAGNOSIS — R569 Unspecified convulsions: Secondary | ICD-10-CM | POA: Diagnosis not present

## 2021-07-30 DIAGNOSIS — F429 Obsessive-compulsive disorder, unspecified: Secondary | ICD-10-CM | POA: Diagnosis not present

## 2021-07-31 DIAGNOSIS — R569 Unspecified convulsions: Secondary | ICD-10-CM | POA: Diagnosis not present

## 2021-07-31 DIAGNOSIS — F429 Obsessive-compulsive disorder, unspecified: Secondary | ICD-10-CM | POA: Diagnosis not present

## 2021-07-31 DIAGNOSIS — F0390 Unspecified dementia without behavioral disturbance: Secondary | ICD-10-CM | POA: Diagnosis not present

## 2021-07-31 DIAGNOSIS — M170 Bilateral primary osteoarthritis of knee: Secondary | ICD-10-CM | POA: Diagnosis not present

## 2021-07-31 DIAGNOSIS — M25562 Pain in left knee: Secondary | ICD-10-CM | POA: Diagnosis not present

## 2021-07-31 DIAGNOSIS — M25561 Pain in right knee: Secondary | ICD-10-CM | POA: Diagnosis not present

## 2021-08-02 DIAGNOSIS — M25561 Pain in right knee: Secondary | ICD-10-CM | POA: Diagnosis not present

## 2021-08-02 DIAGNOSIS — F429 Obsessive-compulsive disorder, unspecified: Secondary | ICD-10-CM | POA: Diagnosis not present

## 2021-08-02 DIAGNOSIS — F0390 Unspecified dementia without behavioral disturbance: Secondary | ICD-10-CM | POA: Diagnosis not present

## 2021-08-02 DIAGNOSIS — M170 Bilateral primary osteoarthritis of knee: Secondary | ICD-10-CM | POA: Diagnosis not present

## 2021-08-02 DIAGNOSIS — R569 Unspecified convulsions: Secondary | ICD-10-CM | POA: Diagnosis not present

## 2021-08-02 DIAGNOSIS — M25562 Pain in left knee: Secondary | ICD-10-CM | POA: Diagnosis not present

## 2021-08-06 DIAGNOSIS — M25562 Pain in left knee: Secondary | ICD-10-CM | POA: Diagnosis not present

## 2021-08-06 DIAGNOSIS — M170 Bilateral primary osteoarthritis of knee: Secondary | ICD-10-CM | POA: Diagnosis not present

## 2021-08-06 DIAGNOSIS — R569 Unspecified convulsions: Secondary | ICD-10-CM | POA: Diagnosis not present

## 2021-08-06 DIAGNOSIS — M25561 Pain in right knee: Secondary | ICD-10-CM | POA: Diagnosis not present

## 2021-08-06 DIAGNOSIS — F429 Obsessive-compulsive disorder, unspecified: Secondary | ICD-10-CM | POA: Diagnosis not present

## 2021-08-06 DIAGNOSIS — F0390 Unspecified dementia without behavioral disturbance: Secondary | ICD-10-CM | POA: Diagnosis not present

## 2021-08-07 DIAGNOSIS — M25562 Pain in left knee: Secondary | ICD-10-CM | POA: Diagnosis not present

## 2021-08-07 DIAGNOSIS — F429 Obsessive-compulsive disorder, unspecified: Secondary | ICD-10-CM | POA: Diagnosis not present

## 2021-08-07 DIAGNOSIS — M170 Bilateral primary osteoarthritis of knee: Secondary | ICD-10-CM | POA: Diagnosis not present

## 2021-08-07 DIAGNOSIS — R569 Unspecified convulsions: Secondary | ICD-10-CM | POA: Diagnosis not present

## 2021-08-07 DIAGNOSIS — F0390 Unspecified dementia without behavioral disturbance: Secondary | ICD-10-CM | POA: Diagnosis not present

## 2021-08-07 DIAGNOSIS — M25561 Pain in right knee: Secondary | ICD-10-CM | POA: Diagnosis not present

## 2021-08-08 DIAGNOSIS — F429 Obsessive-compulsive disorder, unspecified: Secondary | ICD-10-CM | POA: Diagnosis not present

## 2021-08-08 DIAGNOSIS — F0390 Unspecified dementia without behavioral disturbance: Secondary | ICD-10-CM | POA: Diagnosis not present

## 2021-08-08 DIAGNOSIS — M25561 Pain in right knee: Secondary | ICD-10-CM | POA: Diagnosis not present

## 2021-08-08 DIAGNOSIS — M170 Bilateral primary osteoarthritis of knee: Secondary | ICD-10-CM | POA: Diagnosis not present

## 2021-08-08 DIAGNOSIS — M25562 Pain in left knee: Secondary | ICD-10-CM | POA: Diagnosis not present

## 2021-08-08 DIAGNOSIS — N390 Urinary tract infection, site not specified: Secondary | ICD-10-CM | POA: Diagnosis not present

## 2021-08-08 DIAGNOSIS — R569 Unspecified convulsions: Secondary | ICD-10-CM | POA: Diagnosis not present

## 2021-08-14 DIAGNOSIS — M170 Bilateral primary osteoarthritis of knee: Secondary | ICD-10-CM | POA: Diagnosis not present

## 2021-08-14 DIAGNOSIS — M25561 Pain in right knee: Secondary | ICD-10-CM | POA: Diagnosis not present

## 2021-08-14 DIAGNOSIS — M79674 Pain in right toe(s): Secondary | ICD-10-CM | POA: Diagnosis not present

## 2021-08-14 DIAGNOSIS — M79675 Pain in left toe(s): Secondary | ICD-10-CM | POA: Diagnosis not present

## 2021-08-14 DIAGNOSIS — B351 Tinea unguium: Secondary | ICD-10-CM | POA: Diagnosis not present

## 2021-08-14 DIAGNOSIS — R569 Unspecified convulsions: Secondary | ICD-10-CM | POA: Diagnosis not present

## 2021-08-14 DIAGNOSIS — F429 Obsessive-compulsive disorder, unspecified: Secondary | ICD-10-CM | POA: Diagnosis not present

## 2021-08-14 DIAGNOSIS — M25562 Pain in left knee: Secondary | ICD-10-CM | POA: Diagnosis not present

## 2021-08-14 DIAGNOSIS — F0390 Unspecified dementia without behavioral disturbance: Secondary | ICD-10-CM | POA: Diagnosis not present

## 2021-08-15 DIAGNOSIS — M170 Bilateral primary osteoarthritis of knee: Secondary | ICD-10-CM | POA: Diagnosis not present

## 2021-08-15 DIAGNOSIS — F0390 Unspecified dementia without behavioral disturbance: Secondary | ICD-10-CM | POA: Diagnosis not present

## 2021-08-15 DIAGNOSIS — F429 Obsessive-compulsive disorder, unspecified: Secondary | ICD-10-CM | POA: Diagnosis not present

## 2021-08-15 DIAGNOSIS — R569 Unspecified convulsions: Secondary | ICD-10-CM | POA: Diagnosis not present

## 2021-08-15 DIAGNOSIS — M25561 Pain in right knee: Secondary | ICD-10-CM | POA: Diagnosis not present

## 2021-08-15 DIAGNOSIS — M25562 Pain in left knee: Secondary | ICD-10-CM | POA: Diagnosis not present

## 2021-08-16 DIAGNOSIS — M25562 Pain in left knee: Secondary | ICD-10-CM | POA: Diagnosis not present

## 2021-08-16 DIAGNOSIS — R569 Unspecified convulsions: Secondary | ICD-10-CM | POA: Diagnosis not present

## 2021-08-16 DIAGNOSIS — M170 Bilateral primary osteoarthritis of knee: Secondary | ICD-10-CM | POA: Diagnosis not present

## 2021-08-16 DIAGNOSIS — M25561 Pain in right knee: Secondary | ICD-10-CM | POA: Diagnosis not present

## 2021-08-16 DIAGNOSIS — F429 Obsessive-compulsive disorder, unspecified: Secondary | ICD-10-CM | POA: Diagnosis not present

## 2021-08-16 DIAGNOSIS — F0390 Unspecified dementia without behavioral disturbance: Secondary | ICD-10-CM | POA: Diagnosis not present

## 2021-08-20 DIAGNOSIS — R600 Localized edema: Secondary | ICD-10-CM | POA: Diagnosis not present

## 2021-08-20 DIAGNOSIS — M79672 Pain in left foot: Secondary | ICD-10-CM | POA: Diagnosis not present

## 2021-08-20 DIAGNOSIS — I10 Essential (primary) hypertension: Secondary | ICD-10-CM | POA: Diagnosis not present

## 2021-08-27 DIAGNOSIS — R2689 Other abnormalities of gait and mobility: Secondary | ICD-10-CM | POA: Diagnosis not present

## 2021-08-27 DIAGNOSIS — M6281 Muscle weakness (generalized): Secondary | ICD-10-CM | POA: Diagnosis not present

## 2021-08-29 DIAGNOSIS — R2689 Other abnormalities of gait and mobility: Secondary | ICD-10-CM | POA: Diagnosis not present

## 2021-08-29 DIAGNOSIS — M6281 Muscle weakness (generalized): Secondary | ICD-10-CM | POA: Diagnosis not present

## 2021-09-04 DIAGNOSIS — M6281 Muscle weakness (generalized): Secondary | ICD-10-CM | POA: Diagnosis not present

## 2021-09-04 DIAGNOSIS — R2689 Other abnormalities of gait and mobility: Secondary | ICD-10-CM | POA: Diagnosis not present

## 2021-09-06 DIAGNOSIS — M6281 Muscle weakness (generalized): Secondary | ICD-10-CM | POA: Diagnosis not present

## 2021-09-06 DIAGNOSIS — R2689 Other abnormalities of gait and mobility: Secondary | ICD-10-CM | POA: Diagnosis not present

## 2021-09-06 DIAGNOSIS — M109 Gout, unspecified: Secondary | ICD-10-CM | POA: Diagnosis not present

## 2021-09-11 DIAGNOSIS — M6281 Muscle weakness (generalized): Secondary | ICD-10-CM | POA: Diagnosis not present

## 2021-09-11 DIAGNOSIS — R2689 Other abnormalities of gait and mobility: Secondary | ICD-10-CM | POA: Diagnosis not present

## 2021-09-13 DIAGNOSIS — R2689 Other abnormalities of gait and mobility: Secondary | ICD-10-CM | POA: Diagnosis not present

## 2021-09-13 DIAGNOSIS — M6281 Muscle weakness (generalized): Secondary | ICD-10-CM | POA: Diagnosis not present

## 2021-09-17 DIAGNOSIS — R2689 Other abnormalities of gait and mobility: Secondary | ICD-10-CM | POA: Diagnosis not present

## 2021-09-17 DIAGNOSIS — M6281 Muscle weakness (generalized): Secondary | ICD-10-CM | POA: Diagnosis not present

## 2021-09-19 DIAGNOSIS — M6281 Muscle weakness (generalized): Secondary | ICD-10-CM | POA: Diagnosis not present

## 2021-09-19 DIAGNOSIS — R2689 Other abnormalities of gait and mobility: Secondary | ICD-10-CM | POA: Diagnosis not present

## 2021-09-20 DIAGNOSIS — G40909 Epilepsy, unspecified, not intractable, without status epilepticus: Secondary | ICD-10-CM | POA: Diagnosis not present

## 2021-09-20 DIAGNOSIS — I10 Essential (primary) hypertension: Secondary | ICD-10-CM | POA: Diagnosis not present

## 2021-10-01 DIAGNOSIS — R2689 Other abnormalities of gait and mobility: Secondary | ICD-10-CM | POA: Diagnosis not present

## 2021-10-01 DIAGNOSIS — M6281 Muscle weakness (generalized): Secondary | ICD-10-CM | POA: Diagnosis not present

## 2021-10-03 DIAGNOSIS — M6281 Muscle weakness (generalized): Secondary | ICD-10-CM | POA: Diagnosis not present

## 2021-10-03 DIAGNOSIS — R2689 Other abnormalities of gait and mobility: Secondary | ICD-10-CM | POA: Diagnosis not present

## 2021-10-08 DIAGNOSIS — F419 Anxiety disorder, unspecified: Secondary | ICD-10-CM | POA: Diagnosis not present

## 2021-10-08 DIAGNOSIS — F02B18 Dementia in other diseases classified elsewhere, moderate, with other behavioral disturbance: Secondary | ICD-10-CM | POA: Diagnosis not present

## 2021-10-08 DIAGNOSIS — M6281 Muscle weakness (generalized): Secondary | ICD-10-CM | POA: Diagnosis not present

## 2021-10-08 DIAGNOSIS — F251 Schizoaffective disorder, depressive type: Secondary | ICD-10-CM | POA: Diagnosis not present

## 2021-10-08 DIAGNOSIS — R2689 Other abnormalities of gait and mobility: Secondary | ICD-10-CM | POA: Diagnosis not present

## 2021-10-10 DIAGNOSIS — M6281 Muscle weakness (generalized): Secondary | ICD-10-CM | POA: Diagnosis not present

## 2021-10-10 DIAGNOSIS — R2689 Other abnormalities of gait and mobility: Secondary | ICD-10-CM | POA: Diagnosis not present

## 2021-10-15 DIAGNOSIS — R2689 Other abnormalities of gait and mobility: Secondary | ICD-10-CM | POA: Diagnosis not present

## 2021-10-15 DIAGNOSIS — H25813 Combined forms of age-related cataract, bilateral: Secondary | ICD-10-CM | POA: Diagnosis not present

## 2021-10-15 DIAGNOSIS — M6281 Muscle weakness (generalized): Secondary | ICD-10-CM | POA: Diagnosis not present

## 2021-10-18 DIAGNOSIS — G40909 Epilepsy, unspecified, not intractable, without status epilepticus: Secondary | ICD-10-CM | POA: Diagnosis not present

## 2021-10-18 DIAGNOSIS — I10 Essential (primary) hypertension: Secondary | ICD-10-CM | POA: Diagnosis not present

## 2021-10-22 DIAGNOSIS — R2689 Other abnormalities of gait and mobility: Secondary | ICD-10-CM | POA: Diagnosis not present

## 2021-10-22 DIAGNOSIS — M6281 Muscle weakness (generalized): Secondary | ICD-10-CM | POA: Diagnosis not present

## 2021-10-24 DIAGNOSIS — R2689 Other abnormalities of gait and mobility: Secondary | ICD-10-CM | POA: Diagnosis not present

## 2021-10-24 DIAGNOSIS — M6281 Muscle weakness (generalized): Secondary | ICD-10-CM | POA: Diagnosis not present

## 2021-10-29 DIAGNOSIS — M79675 Pain in left toe(s): Secondary | ICD-10-CM | POA: Diagnosis not present

## 2021-10-29 DIAGNOSIS — M79674 Pain in right toe(s): Secondary | ICD-10-CM | POA: Diagnosis not present

## 2021-10-29 DIAGNOSIS — B351 Tinea unguium: Secondary | ICD-10-CM | POA: Diagnosis not present

## 2021-11-01 DIAGNOSIS — F251 Schizoaffective disorder, depressive type: Secondary | ICD-10-CM | POA: Diagnosis not present

## 2021-11-01 DIAGNOSIS — F02B18 Dementia in other diseases classified elsewhere, moderate, with other behavioral disturbance: Secondary | ICD-10-CM | POA: Diagnosis not present

## 2021-11-01 DIAGNOSIS — F419 Anxiety disorder, unspecified: Secondary | ICD-10-CM | POA: Diagnosis not present

## 2021-11-08 DIAGNOSIS — Z20822 Contact with and (suspected) exposure to covid-19: Secondary | ICD-10-CM | POA: Diagnosis not present

## 2021-11-19 DIAGNOSIS — G40909 Epilepsy, unspecified, not intractable, without status epilepticus: Secondary | ICD-10-CM | POA: Diagnosis not present

## 2021-11-19 DIAGNOSIS — I10 Essential (primary) hypertension: Secondary | ICD-10-CM | POA: Diagnosis not present

## 2021-11-19 IMAGING — DX DG KNEE COMPLETE 4+V*R*
4 series · 4 of 4 positions shown · non-contrast
Comparison: None.

CLINICAL DATA: Pain following fall

EXAM:
RIGHT KNEE - COMPLETE 4+ VIEW

[knee ap]
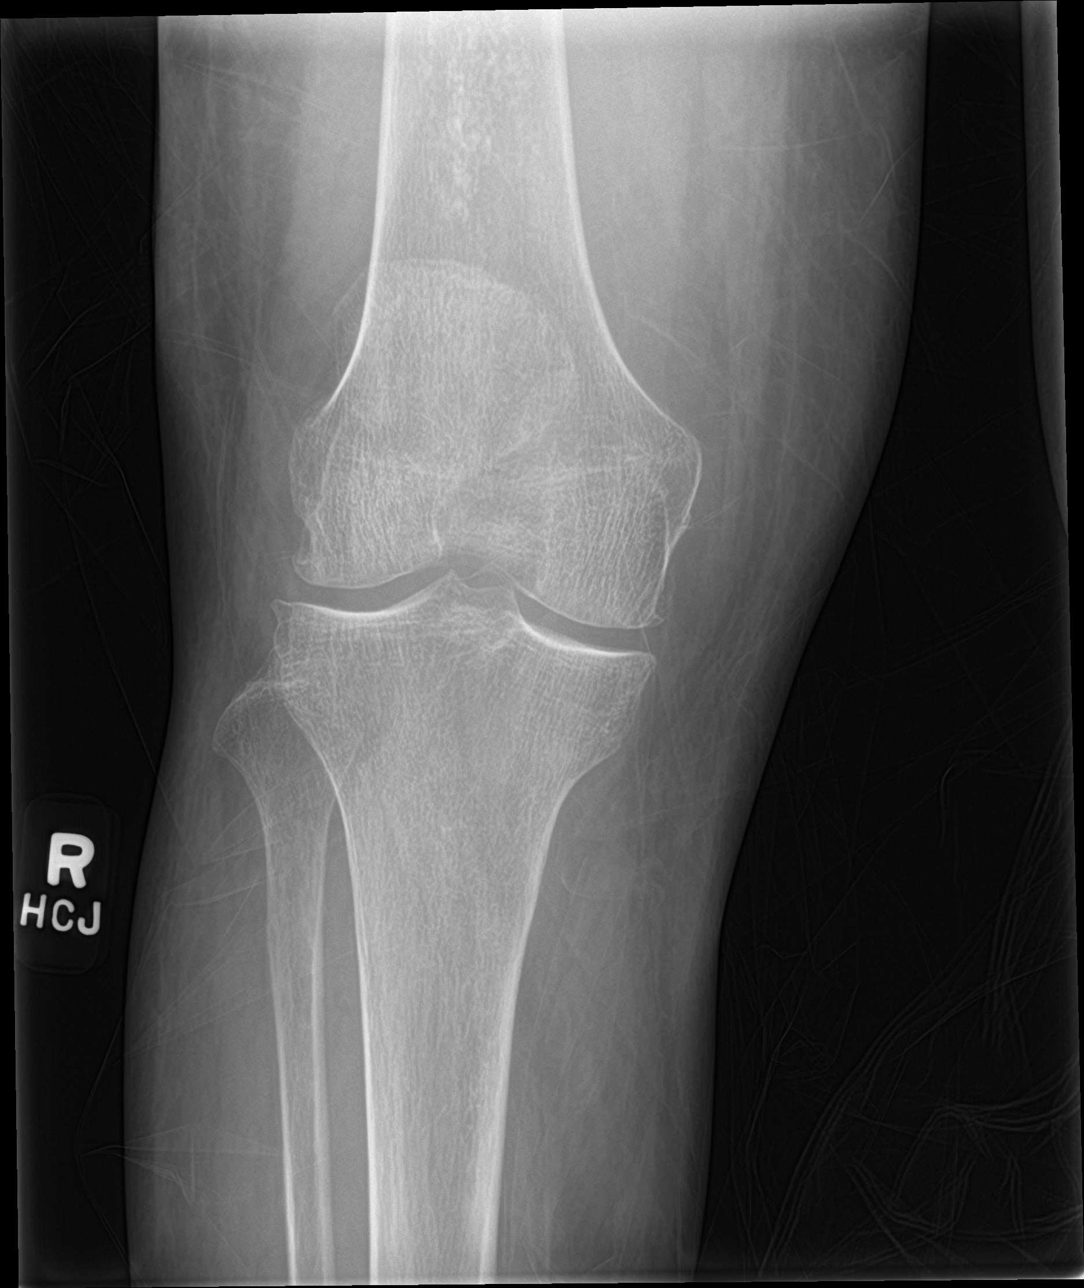

[knee obl (1 of 2)]
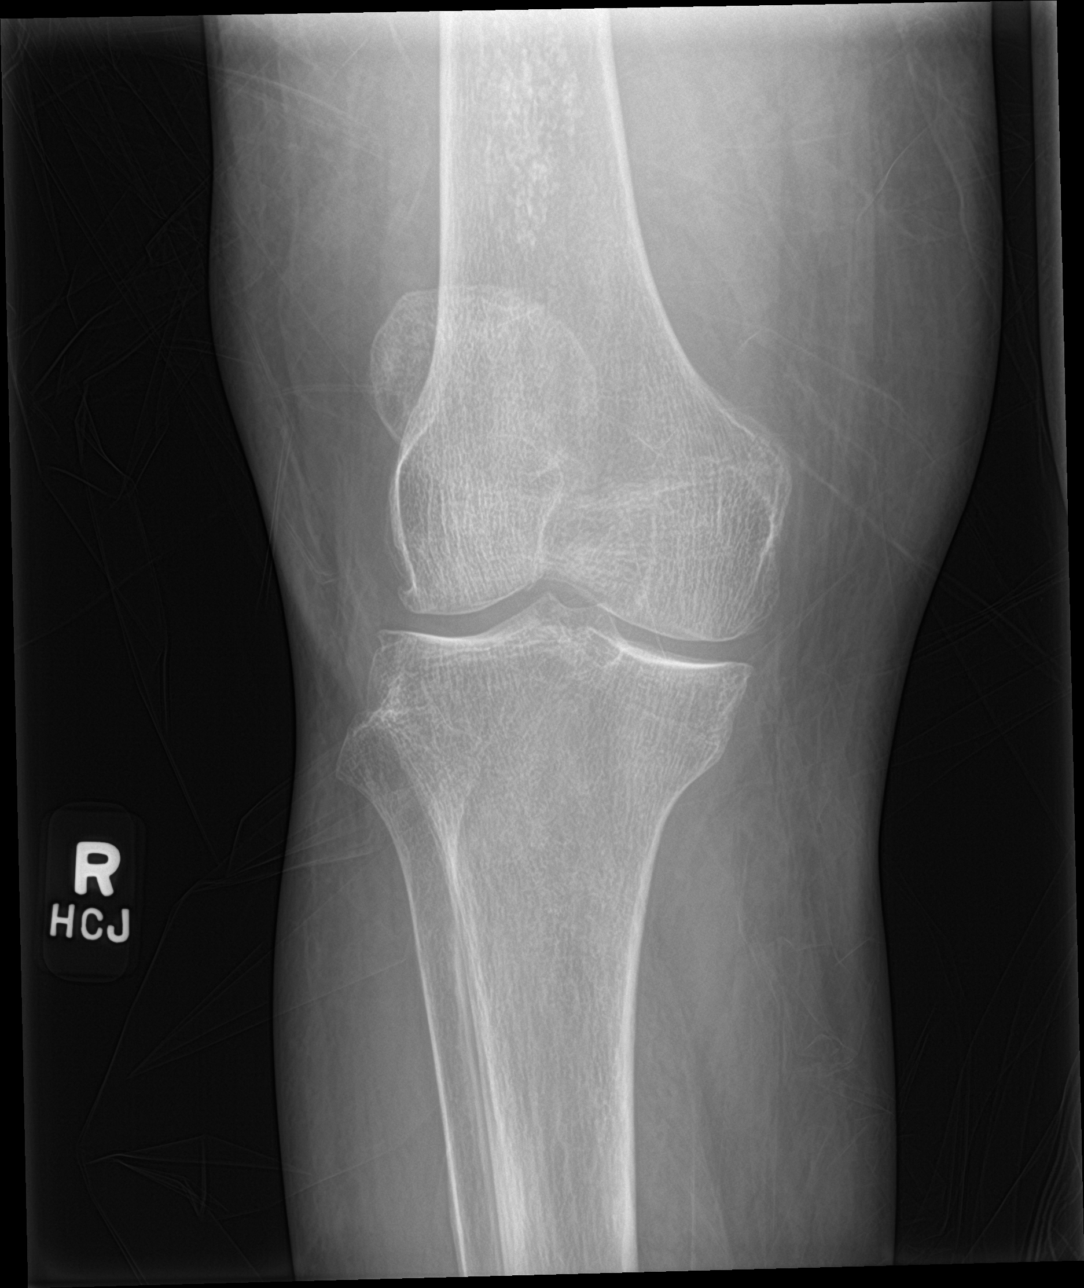

[knee obl (2 of 2)]
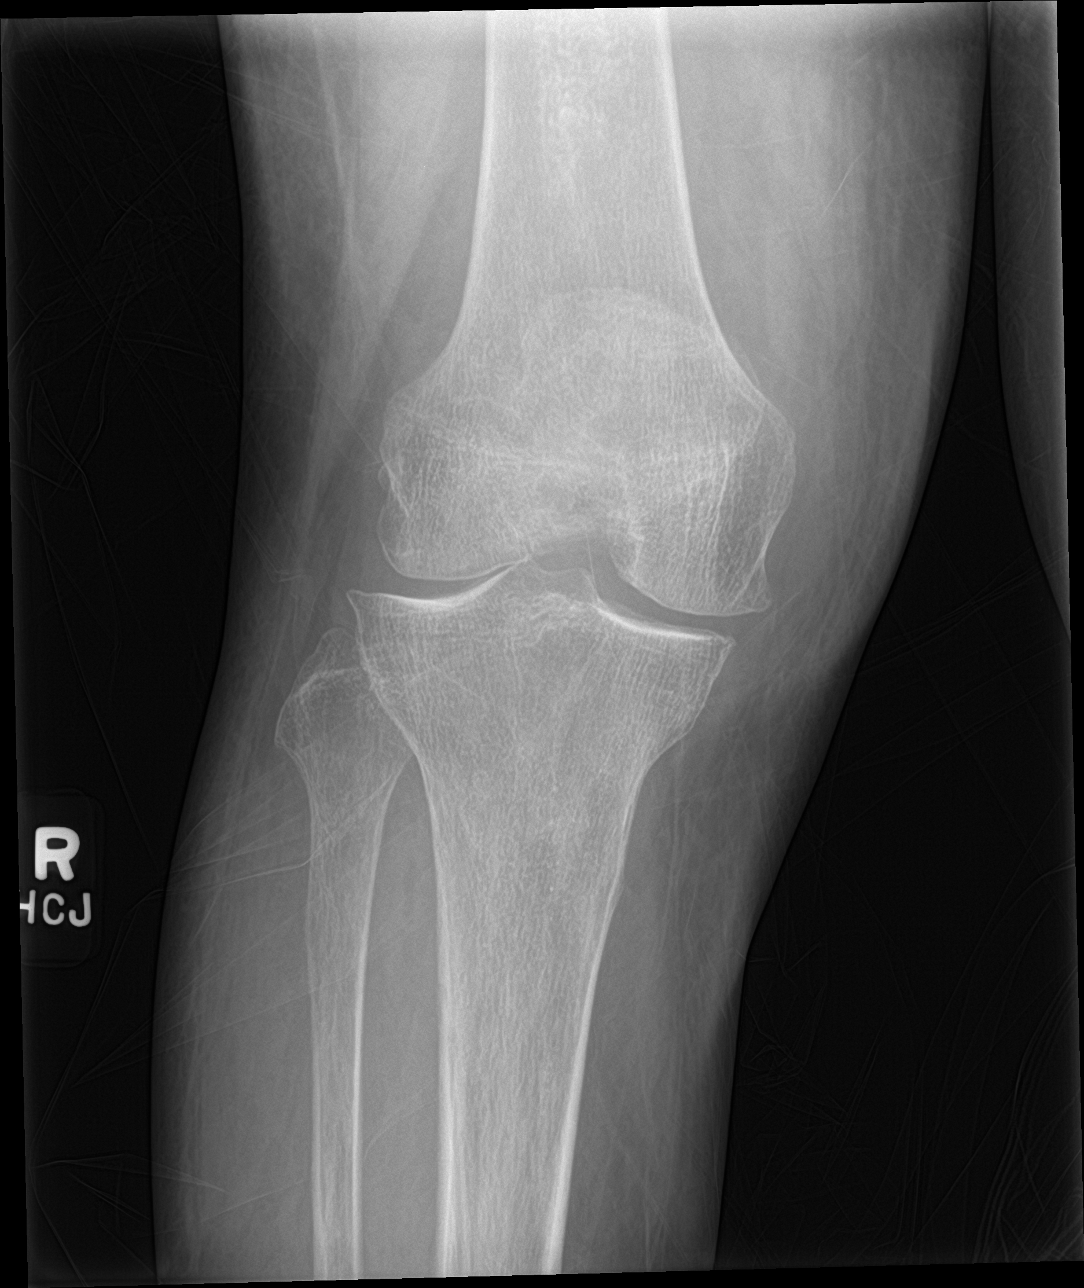

[knee lat]
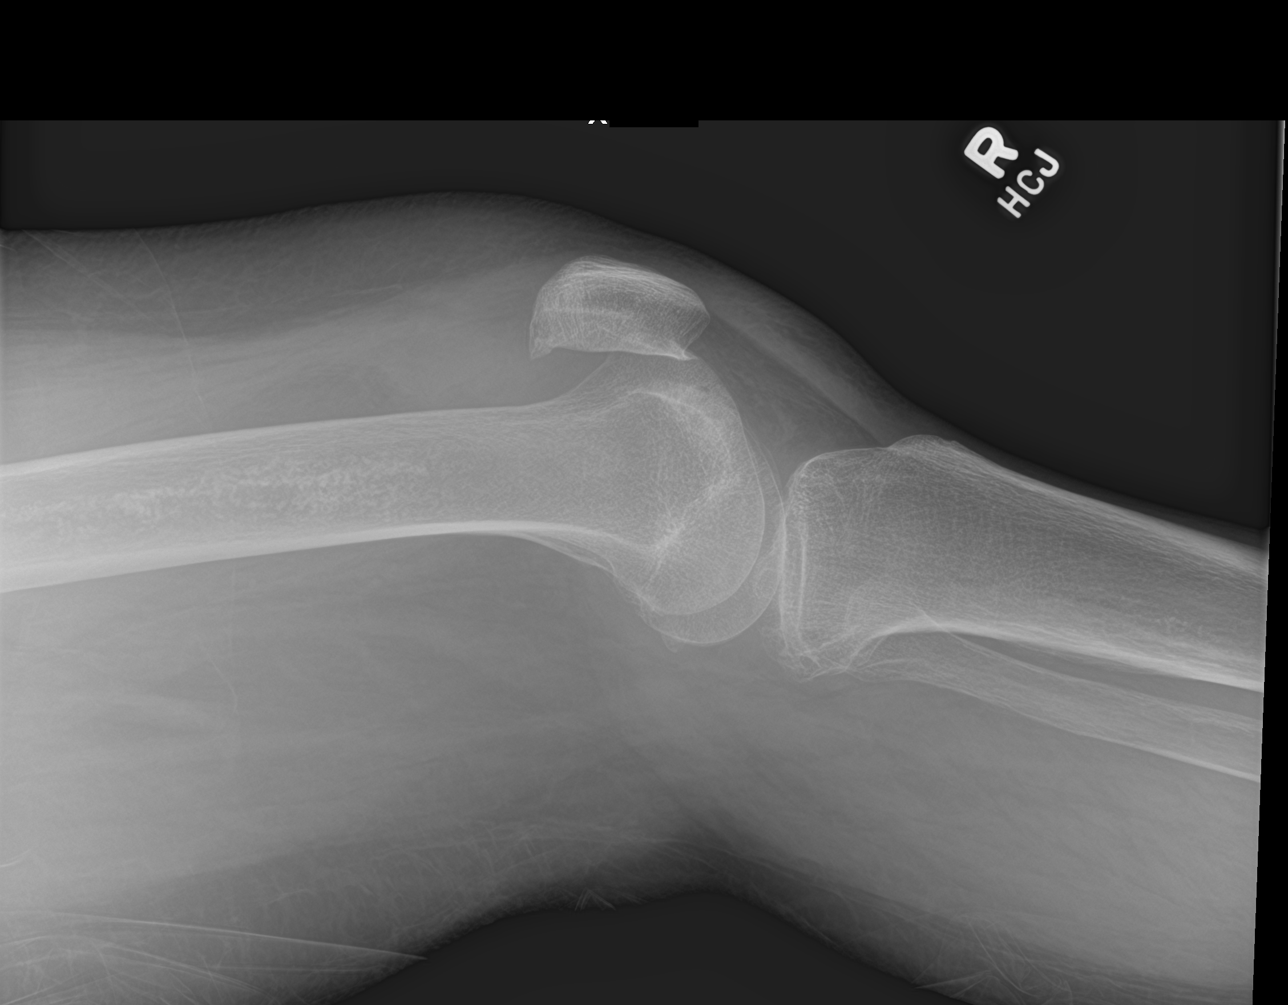

[4 of 4 positions shown; findings below may reference images not displayed]

FINDINGS: Frontal, lateral, and bilateral oblique views were obtained. There
is no fracture or dislocation. There is a focal moderate joint
effusion. There is mild spurring in all compartments. No appreciable
joint space narrowing, however. No erosive change.
IMPRESSION: Moderate joint effusion. No fracture or dislocation. Mild spurring
in all compartments without appreciable joint space narrowing.

## 2021-11-29 DIAGNOSIS — F02B18 Dementia in other diseases classified elsewhere, moderate, with other behavioral disturbance: Secondary | ICD-10-CM | POA: Diagnosis not present

## 2021-11-29 DIAGNOSIS — F251 Schizoaffective disorder, depressive type: Secondary | ICD-10-CM | POA: Diagnosis not present

## 2021-11-29 DIAGNOSIS — F419 Anxiety disorder, unspecified: Secondary | ICD-10-CM | POA: Diagnosis not present

## 2021-12-10 DIAGNOSIS — Z20822 Contact with and (suspected) exposure to covid-19: Secondary | ICD-10-CM | POA: Diagnosis not present

## 2021-12-19 DIAGNOSIS — I10 Essential (primary) hypertension: Secondary | ICD-10-CM | POA: Diagnosis not present

## 2021-12-19 DIAGNOSIS — F209 Schizophrenia, unspecified: Secondary | ICD-10-CM | POA: Diagnosis not present

## 2021-12-20 DIAGNOSIS — F251 Schizoaffective disorder, depressive type: Secondary | ICD-10-CM | POA: Diagnosis not present

## 2021-12-20 DIAGNOSIS — Z5181 Encounter for therapeutic drug level monitoring: Secondary | ICD-10-CM | POA: Diagnosis not present

## 2021-12-22 DIAGNOSIS — Z20822 Contact with and (suspected) exposure to covid-19: Secondary | ICD-10-CM | POA: Diagnosis not present

## 2021-12-27 DIAGNOSIS — F251 Schizoaffective disorder, depressive type: Secondary | ICD-10-CM | POA: Diagnosis not present

## 2021-12-27 DIAGNOSIS — F419 Anxiety disorder, unspecified: Secondary | ICD-10-CM | POA: Diagnosis not present

## 2021-12-27 DIAGNOSIS — F02B18 Dementia in other diseases classified elsewhere, moderate, with other behavioral disturbance: Secondary | ICD-10-CM | POA: Diagnosis not present

## 2022-01-01 DIAGNOSIS — M79674 Pain in right toe(s): Secondary | ICD-10-CM | POA: Diagnosis not present

## 2022-01-01 DIAGNOSIS — M79675 Pain in left toe(s): Secondary | ICD-10-CM | POA: Diagnosis not present

## 2022-01-01 DIAGNOSIS — B351 Tinea unguium: Secondary | ICD-10-CM | POA: Diagnosis not present

## 2022-01-21 DIAGNOSIS — G40909 Epilepsy, unspecified, not intractable, without status epilepticus: Secondary | ICD-10-CM | POA: Diagnosis not present

## 2022-01-21 DIAGNOSIS — I10 Essential (primary) hypertension: Secondary | ICD-10-CM | POA: Diagnosis not present

## 2022-01-21 DIAGNOSIS — F209 Schizophrenia, unspecified: Secondary | ICD-10-CM | POA: Diagnosis not present

## 2022-01-21 DIAGNOSIS — F0390 Unspecified dementia without behavioral disturbance: Secondary | ICD-10-CM | POA: Diagnosis not present

## 2022-01-24 DIAGNOSIS — F251 Schizoaffective disorder, depressive type: Secondary | ICD-10-CM | POA: Diagnosis not present

## 2022-01-24 DIAGNOSIS — F02B18 Dementia in other diseases classified elsewhere, moderate, with other behavioral disturbance: Secondary | ICD-10-CM | POA: Diagnosis not present

## 2022-01-24 DIAGNOSIS — F419 Anxiety disorder, unspecified: Secondary | ICD-10-CM | POA: Diagnosis not present

## 2022-01-30 DIAGNOSIS — Z1159 Encounter for screening for other viral diseases: Secondary | ICD-10-CM | POA: Diagnosis not present

## 2022-01-30 DIAGNOSIS — G40909 Epilepsy, unspecified, not intractable, without status epilepticus: Secondary | ICD-10-CM | POA: Diagnosis not present

## 2022-01-30 DIAGNOSIS — I10 Essential (primary) hypertension: Secondary | ICD-10-CM | POA: Diagnosis not present

## 2022-01-30 DIAGNOSIS — Z79899 Other long term (current) drug therapy: Secondary | ICD-10-CM | POA: Diagnosis not present

## 2022-02-01 DIAGNOSIS — F251 Schizoaffective disorder, depressive type: Secondary | ICD-10-CM | POA: Diagnosis not present

## 2022-02-01 DIAGNOSIS — Z5181 Encounter for therapeutic drug level monitoring: Secondary | ICD-10-CM | POA: Diagnosis not present

## 2022-02-15 DIAGNOSIS — N390 Urinary tract infection, site not specified: Secondary | ICD-10-CM | POA: Diagnosis not present

## 2022-02-20 DIAGNOSIS — F209 Schizophrenia, unspecified: Secondary | ICD-10-CM | POA: Diagnosis not present

## 2022-02-20 DIAGNOSIS — I10 Essential (primary) hypertension: Secondary | ICD-10-CM | POA: Diagnosis not present

## 2022-02-21 DIAGNOSIS — F251 Schizoaffective disorder, depressive type: Secondary | ICD-10-CM | POA: Diagnosis not present

## 2022-02-21 DIAGNOSIS — F02B18 Dementia in other diseases classified elsewhere, moderate, with other behavioral disturbance: Secondary | ICD-10-CM | POA: Diagnosis not present

## 2022-02-21 DIAGNOSIS — F419 Anxiety disorder, unspecified: Secondary | ICD-10-CM | POA: Diagnosis not present

## 2022-03-04 DIAGNOSIS — M170 Bilateral primary osteoarthritis of knee: Secondary | ICD-10-CM | POA: Diagnosis not present

## 2022-03-04 DIAGNOSIS — R1311 Dysphagia, oral phase: Secondary | ICD-10-CM | POA: Diagnosis present

## 2022-03-04 DIAGNOSIS — R45851 Suicidal ideations: Secondary | ICD-10-CM | POA: Diagnosis present

## 2022-03-04 DIAGNOSIS — S83281A Other tear of lateral meniscus, current injury, right knee, initial encounter: Secondary | ICD-10-CM | POA: Diagnosis not present

## 2022-03-04 DIAGNOSIS — W19XXXD Unspecified fall, subsequent encounter: Secondary | ICD-10-CM | POA: Diagnosis not present

## 2022-03-04 DIAGNOSIS — G8929 Other chronic pain: Secondary | ICD-10-CM | POA: Diagnosis present

## 2022-03-04 DIAGNOSIS — W19XXXA Unspecified fall, initial encounter: Secondary | ICD-10-CM | POA: Diagnosis not present

## 2022-03-04 DIAGNOSIS — G40909 Epilepsy, unspecified, not intractable, without status epilepticus: Secondary | ICD-10-CM | POA: Diagnosis not present

## 2022-03-04 DIAGNOSIS — R296 Repeated falls: Secondary | ICD-10-CM | POA: Diagnosis not present

## 2022-03-04 DIAGNOSIS — Z8619 Personal history of other infectious and parasitic diseases: Secondary | ICD-10-CM | POA: Diagnosis not present

## 2022-03-04 DIAGNOSIS — M1711 Unilateral primary osteoarthritis, right knee: Secondary | ICD-10-CM | POA: Diagnosis not present

## 2022-03-04 DIAGNOSIS — T7840XD Allergy, unspecified, subsequent encounter: Secondary | ICD-10-CM | POA: Diagnosis not present

## 2022-03-04 DIAGNOSIS — S8991XA Unspecified injury of right lower leg, initial encounter: Secondary | ICD-10-CM | POA: Diagnosis not present

## 2022-03-04 DIAGNOSIS — E871 Hypo-osmolality and hyponatremia: Secondary | ICD-10-CM | POA: Diagnosis present

## 2022-03-04 DIAGNOSIS — Y92239 Unspecified place in hospital as the place of occurrence of the external cause: Secondary | ICD-10-CM | POA: Diagnosis not present

## 2022-03-04 DIAGNOSIS — T17320A Food in larynx causing asphyxiation, initial encounter: Secondary | ICD-10-CM | POA: Diagnosis not present

## 2022-03-04 DIAGNOSIS — M25561 Pain in right knee: Secondary | ICD-10-CM | POA: Diagnosis not present

## 2022-03-04 DIAGNOSIS — I10 Essential (primary) hypertension: Secondary | ICD-10-CM | POA: Diagnosis present

## 2022-03-04 DIAGNOSIS — R1310 Dysphagia, unspecified: Secondary | ICD-10-CM | POA: Diagnosis not present

## 2022-03-04 DIAGNOSIS — D1621 Benign neoplasm of long bones of right lower limb: Secondary | ICD-10-CM | POA: Diagnosis not present

## 2022-03-04 DIAGNOSIS — F200 Paranoid schizophrenia: Secondary | ICD-10-CM | POA: Diagnosis present

## 2022-03-04 DIAGNOSIS — N3000 Acute cystitis without hematuria: Secondary | ICD-10-CM | POA: Diagnosis present

## 2022-03-04 DIAGNOSIS — R059 Cough, unspecified: Secondary | ICD-10-CM | POA: Diagnosis not present

## 2022-03-04 DIAGNOSIS — N3090 Cystitis, unspecified without hematuria: Secondary | ICD-10-CM | POA: Diagnosis not present

## 2022-03-05 DIAGNOSIS — F200 Paranoid schizophrenia: Secondary | ICD-10-CM | POA: Diagnosis not present

## 2022-03-05 DIAGNOSIS — R45851 Suicidal ideations: Secondary | ICD-10-CM | POA: Diagnosis not present

## 2022-03-05 DIAGNOSIS — N3090 Cystitis, unspecified without hematuria: Secondary | ICD-10-CM | POA: Diagnosis not present

## 2022-03-06 DIAGNOSIS — F200 Paranoid schizophrenia: Secondary | ICD-10-CM | POA: Diagnosis present

## 2022-03-06 DIAGNOSIS — Y92239 Unspecified place in hospital as the place of occurrence of the external cause: Secondary | ICD-10-CM | POA: Diagnosis not present

## 2022-03-06 DIAGNOSIS — E871 Hypo-osmolality and hyponatremia: Secondary | ICD-10-CM | POA: Diagnosis present

## 2022-03-06 DIAGNOSIS — R1310 Dysphagia, unspecified: Secondary | ICD-10-CM | POA: Diagnosis not present

## 2022-03-06 DIAGNOSIS — N3090 Cystitis, unspecified without hematuria: Secondary | ICD-10-CM | POA: Diagnosis not present

## 2022-03-06 DIAGNOSIS — M25561 Pain in right knee: Secondary | ICD-10-CM | POA: Diagnosis not present

## 2022-03-06 DIAGNOSIS — G8929 Other chronic pain: Secondary | ICD-10-CM | POA: Diagnosis present

## 2022-03-06 DIAGNOSIS — T7840XD Allergy, unspecified, subsequent encounter: Secondary | ICD-10-CM | POA: Diagnosis not present

## 2022-03-06 DIAGNOSIS — I10 Essential (primary) hypertension: Secondary | ICD-10-CM | POA: Diagnosis present

## 2022-03-06 DIAGNOSIS — N3000 Acute cystitis without hematuria: Secondary | ICD-10-CM | POA: Diagnosis present

## 2022-03-06 DIAGNOSIS — S8991XA Unspecified injury of right lower leg, initial encounter: Secondary | ICD-10-CM | POA: Diagnosis not present

## 2022-03-06 DIAGNOSIS — W19XXXD Unspecified fall, subsequent encounter: Secondary | ICD-10-CM | POA: Diagnosis not present

## 2022-03-06 DIAGNOSIS — M1711 Unilateral primary osteoarthritis, right knee: Secondary | ICD-10-CM | POA: Diagnosis not present

## 2022-03-06 DIAGNOSIS — R296 Repeated falls: Secondary | ICD-10-CM | POA: Diagnosis not present

## 2022-03-06 DIAGNOSIS — Z8619 Personal history of other infectious and parasitic diseases: Secondary | ICD-10-CM | POA: Diagnosis not present

## 2022-03-06 DIAGNOSIS — W19XXXA Unspecified fall, initial encounter: Secondary | ICD-10-CM | POA: Diagnosis not present

## 2022-03-06 DIAGNOSIS — S83281A Other tear of lateral meniscus, current injury, right knee, initial encounter: Secondary | ICD-10-CM | POA: Diagnosis not present

## 2022-03-06 DIAGNOSIS — M170 Bilateral primary osteoarthritis of knee: Secondary | ICD-10-CM | POA: Diagnosis present

## 2022-03-06 DIAGNOSIS — R059 Cough, unspecified: Secondary | ICD-10-CM | POA: Diagnosis not present

## 2022-03-06 DIAGNOSIS — R45851 Suicidal ideations: Secondary | ICD-10-CM | POA: Diagnosis present

## 2022-03-06 DIAGNOSIS — T17320A Food in larynx causing asphyxiation, initial encounter: Secondary | ICD-10-CM | POA: Diagnosis not present

## 2022-03-06 DIAGNOSIS — D1621 Benign neoplasm of long bones of right lower limb: Secondary | ICD-10-CM | POA: Diagnosis present

## 2022-03-06 DIAGNOSIS — G40909 Epilepsy, unspecified, not intractable, without status epilepticus: Secondary | ICD-10-CM | POA: Diagnosis present

## 2022-03-06 DIAGNOSIS — R1311 Dysphagia, oral phase: Secondary | ICD-10-CM | POA: Diagnosis present

## 2022-03-19 DIAGNOSIS — M79674 Pain in right toe(s): Secondary | ICD-10-CM | POA: Diagnosis not present

## 2022-03-19 DIAGNOSIS — B351 Tinea unguium: Secondary | ICD-10-CM | POA: Diagnosis not present

## 2022-03-19 DIAGNOSIS — M79675 Pain in left toe(s): Secondary | ICD-10-CM | POA: Diagnosis not present

## 2022-03-20 ENCOUNTER — Telehealth: Payer: Self-pay | Admitting: Orthopedic Surgery

## 2022-03-21 DIAGNOSIS — N390 Urinary tract infection, site not specified: Secondary | ICD-10-CM | POA: Diagnosis not present

## 2022-03-21 NOTE — Telephone Encounter (Signed)
Facility, [facility], per Nettie Elm, called to inquire about scheduling appointment - aware patient was seen here in the past. York Spaniel will request notes and will fax to our office.

## 2022-03-22 DIAGNOSIS — F419 Anxiety disorder, unspecified: Secondary | ICD-10-CM | POA: Diagnosis not present

## 2022-03-22 DIAGNOSIS — F02B18 Dementia in other diseases classified elsewhere, moderate, with other behavioral disturbance: Secondary | ICD-10-CM | POA: Diagnosis not present

## 2022-03-22 DIAGNOSIS — F251 Schizoaffective disorder, depressive type: Secondary | ICD-10-CM | POA: Diagnosis not present

## 2022-03-28 DIAGNOSIS — F0390 Unspecified dementia without behavioral disturbance: Secondary | ICD-10-CM | POA: Diagnosis not present

## 2022-03-28 DIAGNOSIS — I10 Essential (primary) hypertension: Secondary | ICD-10-CM | POA: Diagnosis not present

## 2022-03-28 DIAGNOSIS — F209 Schizophrenia, unspecified: Secondary | ICD-10-CM | POA: Diagnosis not present

## 2022-03-28 DIAGNOSIS — G40909 Epilepsy, unspecified, not intractable, without status epilepticus: Secondary | ICD-10-CM | POA: Diagnosis not present

## 2022-04-16 ENCOUNTER — Encounter: Payer: Self-pay | Admitting: Orthopedic Surgery

## 2022-04-16 ENCOUNTER — Ambulatory Visit (INDEPENDENT_AMBULATORY_CARE_PROVIDER_SITE_OTHER): Payer: Medicare Other | Admitting: Orthopedic Surgery

## 2022-04-16 ENCOUNTER — Ambulatory Visit (INDEPENDENT_AMBULATORY_CARE_PROVIDER_SITE_OTHER): Payer: Medicare Other

## 2022-04-16 ENCOUNTER — Other Ambulatory Visit: Payer: Self-pay | Admitting: Orthopedic Surgery

## 2022-04-16 DIAGNOSIS — M1711 Unilateral primary osteoarthritis, right knee: Secondary | ICD-10-CM | POA: Diagnosis not present

## 2022-04-16 DIAGNOSIS — M25561 Pain in right knee: Secondary | ICD-10-CM

## 2022-04-16 MED ORDER — METHYLPREDNISOLONE ACETATE 40 MG/ML IJ SUSP
40.0000 mg | Freq: Once | INTRAMUSCULAR | Status: AC
  Administered 2022-04-16: 40 mg via INTRA_ARTICULAR

## 2022-04-16 NOTE — Progress Notes (Signed)
[facility] Return  Assessment: Stephanie Vincent is a 66 y.o. female with the following: Right knee arthritis  Plan: Stephanie Vincent has pain in her right knee after recent fall.  Radiographs are negative.  She does have evidence of degenerative changes.  On physical exam, she has diffuse tenderness.  No bruising.  Minimal swelling.  Offered her a steroid injection, and she elected proceed.  This was done without issues.    Procedure note injection Right knee joint   Verbal consent was obtained to inject the right knee joint  Timeout was completed to confirm the site of injection.  The skin was prepped with alcohol and ethyl chloride was sprayed at the injection site.  A 21-gauge needle was used to inject 40 mg of Depo-Medrol and 1% lidocaine (3 cc) into the right knee using an anterolateral approach.  There were no complications. A sterile bandage was applied.    Follow-up: Return if symptoms worsen or fail to improve.   Subjective:  Chief Complaint  Patient presents with   Knee Pain    R/ it has been bothering me for about a month now. It is swollen some, pain goes down to foot. Larey Seat a little while back.    History of Present Illness: Stephanie Vincent is a 66 y.o. female who presents to clinic today for evaluation of right knee pain.  She states that she fell onto the right knee about a month ago.  She was evaluated elsewhere, and radiographs were negative.  She continues to have pain in the right knee.  Activity is limited at baseline.  Occasional swelling.   Review of Systems: No fevers or chills No numbness or tingling No chest pain No shortness of breath No bowel or bladder dysfunction No GI distress No headaches   Objective: There were no vitals taken for this visit.  Physical Exam:  Alert and oriented.  No acute distress.  Evaluation of the right knee demonstrates mild swelling.  No bruising is appreciated.  She tolerates passive range of motion  of the right knee.  Tenderness palpation along the medial lateral joint lines.  She is seated in a wheelchair.  IMAGING: I personally ordered and reviewed the following images:  X-ray of the right knee was obtained in clinic today.  No acute injuries are noted.  Loss of joint Vincent within the medial lateral compartments.  There are associated osteophytes in both compartments.  Impression: Right knee arthritis  Stephanie Barre, MD 04/16/2022 3:01 PM

## 2022-04-16 NOTE — Patient Instructions (Signed)

## 2022-04-18 DIAGNOSIS — F02B18 Dementia in other diseases classified elsewhere, moderate, with other behavioral disturbance: Secondary | ICD-10-CM | POA: Diagnosis not present

## 2022-04-18 DIAGNOSIS — F251 Schizoaffective disorder, depressive type: Secondary | ICD-10-CM | POA: Diagnosis not present

## 2022-04-18 DIAGNOSIS — F419 Anxiety disorder, unspecified: Secondary | ICD-10-CM | POA: Diagnosis not present

## 2022-04-18 DIAGNOSIS — H25813 Combined forms of age-related cataract, bilateral: Secondary | ICD-10-CM | POA: Diagnosis not present

## 2022-04-28 DIAGNOSIS — F209 Schizophrenia, unspecified: Secondary | ICD-10-CM | POA: Diagnosis not present

## 2022-04-28 DIAGNOSIS — I10 Essential (primary) hypertension: Secondary | ICD-10-CM | POA: Diagnosis not present

## 2022-05-15 DIAGNOSIS — Z23 Encounter for immunization: Secondary | ICD-10-CM | POA: Diagnosis not present

## 2022-05-16 DIAGNOSIS — F419 Anxiety disorder, unspecified: Secondary | ICD-10-CM | POA: Diagnosis not present

## 2022-05-16 DIAGNOSIS — F251 Schizoaffective disorder, depressive type: Secondary | ICD-10-CM | POA: Diagnosis not present

## 2022-05-16 DIAGNOSIS — F02B18 Dementia in other diseases classified elsewhere, moderate, with other behavioral disturbance: Secondary | ICD-10-CM | POA: Diagnosis not present

## 2022-05-28 DIAGNOSIS — M79674 Pain in right toe(s): Secondary | ICD-10-CM | POA: Diagnosis not present

## 2022-05-28 DIAGNOSIS — I10 Essential (primary) hypertension: Secondary | ICD-10-CM | POA: Diagnosis not present

## 2022-05-28 DIAGNOSIS — F209 Schizophrenia, unspecified: Secondary | ICD-10-CM | POA: Diagnosis not present

## 2022-05-28 DIAGNOSIS — B351 Tinea unguium: Secondary | ICD-10-CM | POA: Diagnosis not present

## 2022-05-28 DIAGNOSIS — M79675 Pain in left toe(s): Secondary | ICD-10-CM | POA: Diagnosis not present

## 2022-06-13 DIAGNOSIS — F02B18 Dementia in other diseases classified elsewhere, moderate, with other behavioral disturbance: Secondary | ICD-10-CM | POA: Diagnosis not present

## 2022-06-13 DIAGNOSIS — F5101 Primary insomnia: Secondary | ICD-10-CM | POA: Diagnosis not present

## 2022-06-13 DIAGNOSIS — F251 Schizoaffective disorder, depressive type: Secondary | ICD-10-CM | POA: Diagnosis not present

## 2022-06-13 DIAGNOSIS — F419 Anxiety disorder, unspecified: Secondary | ICD-10-CM | POA: Diagnosis not present

## 2022-06-21 DIAGNOSIS — F251 Schizoaffective disorder, depressive type: Secondary | ICD-10-CM | POA: Diagnosis not present

## 2022-06-21 DIAGNOSIS — Z5181 Encounter for therapeutic drug level monitoring: Secondary | ICD-10-CM | POA: Diagnosis not present

## 2022-06-28 DIAGNOSIS — G40909 Epilepsy, unspecified, not intractable, without status epilepticus: Secondary | ICD-10-CM | POA: Diagnosis not present

## 2022-06-28 DIAGNOSIS — I10 Essential (primary) hypertension: Secondary | ICD-10-CM | POA: Diagnosis not present

## 2022-07-08 DIAGNOSIS — F02B18 Dementia in other diseases classified elsewhere, moderate, with other behavioral disturbance: Secondary | ICD-10-CM | POA: Diagnosis not present

## 2022-07-08 DIAGNOSIS — F419 Anxiety disorder, unspecified: Secondary | ICD-10-CM | POA: Diagnosis not present

## 2022-07-08 DIAGNOSIS — F251 Schizoaffective disorder, depressive type: Secondary | ICD-10-CM | POA: Diagnosis not present

## 2022-07-08 DIAGNOSIS — F5101 Primary insomnia: Secondary | ICD-10-CM | POA: Diagnosis not present

## 2022-07-08 DIAGNOSIS — F633 Trichotillomania: Secondary | ICD-10-CM | POA: Diagnosis not present

## 2022-07-17 DIAGNOSIS — M6281 Muscle weakness (generalized): Secondary | ICD-10-CM | POA: Diagnosis not present

## 2022-07-23 ENCOUNTER — Other Ambulatory Visit (HOSPITAL_COMMUNITY): Payer: Self-pay | Admitting: Internal Medicine

## 2022-07-23 DIAGNOSIS — Z1389 Encounter for screening for other disorder: Secondary | ICD-10-CM | POA: Diagnosis not present

## 2022-07-23 DIAGNOSIS — F209 Schizophrenia, unspecified: Secondary | ICD-10-CM | POA: Diagnosis not present

## 2022-07-23 DIAGNOSIS — Z1231 Encounter for screening mammogram for malignant neoplasm of breast: Secondary | ICD-10-CM

## 2022-07-23 DIAGNOSIS — Z1331 Encounter for screening for depression: Secondary | ICD-10-CM | POA: Diagnosis not present

## 2022-07-23 DIAGNOSIS — G40909 Epilepsy, unspecified, not intractable, without status epilepticus: Secondary | ICD-10-CM | POA: Diagnosis not present

## 2022-07-23 DIAGNOSIS — F0390 Unspecified dementia without behavioral disturbance: Secondary | ICD-10-CM | POA: Diagnosis not present

## 2022-07-23 DIAGNOSIS — M170 Bilateral primary osteoarthritis of knee: Secondary | ICD-10-CM | POA: Diagnosis not present

## 2022-07-23 DIAGNOSIS — I10 Essential (primary) hypertension: Secondary | ICD-10-CM | POA: Diagnosis not present

## 2022-07-24 DIAGNOSIS — M6281 Muscle weakness (generalized): Secondary | ICD-10-CM | POA: Diagnosis not present

## 2022-07-27 DIAGNOSIS — M6281 Muscle weakness (generalized): Secondary | ICD-10-CM | POA: Diagnosis not present

## 2022-07-29 DIAGNOSIS — M6281 Muscle weakness (generalized): Secondary | ICD-10-CM | POA: Diagnosis not present

## 2022-07-30 DIAGNOSIS — M79675 Pain in left toe(s): Secondary | ICD-10-CM | POA: Diagnosis not present

## 2022-07-30 DIAGNOSIS — M79674 Pain in right toe(s): Secondary | ICD-10-CM | POA: Diagnosis not present

## 2022-07-30 DIAGNOSIS — B351 Tinea unguium: Secondary | ICD-10-CM | POA: Diagnosis not present

## 2022-07-31 ENCOUNTER — Ambulatory Visit (HOSPITAL_COMMUNITY): Payer: Medicare Other

## 2022-07-31 DIAGNOSIS — M6281 Muscle weakness (generalized): Secondary | ICD-10-CM | POA: Diagnosis not present

## 2022-08-07 DIAGNOSIS — M6281 Muscle weakness (generalized): Secondary | ICD-10-CM | POA: Diagnosis not present

## 2022-08-14 DIAGNOSIS — Z0001 Encounter for general adult medical examination with abnormal findings: Secondary | ICD-10-CM | POA: Diagnosis not present

## 2022-08-14 DIAGNOSIS — I10 Essential (primary) hypertension: Secondary | ICD-10-CM | POA: Diagnosis not present

## 2022-08-14 DIAGNOSIS — M6281 Muscle weakness (generalized): Secondary | ICD-10-CM | POA: Diagnosis not present

## 2022-08-15 ENCOUNTER — Ambulatory Visit (HOSPITAL_COMMUNITY): Payer: Medicare Other

## 2022-08-16 DIAGNOSIS — F633 Trichotillomania: Secondary | ICD-10-CM | POA: Diagnosis not present

## 2022-08-16 DIAGNOSIS — F251 Schizoaffective disorder, depressive type: Secondary | ICD-10-CM | POA: Diagnosis not present

## 2022-08-16 DIAGNOSIS — F5101 Primary insomnia: Secondary | ICD-10-CM | POA: Diagnosis not present

## 2022-08-16 DIAGNOSIS — F02B18 Dementia in other diseases classified elsewhere, moderate, with other behavioral disturbance: Secondary | ICD-10-CM | POA: Diagnosis not present

## 2022-08-16 DIAGNOSIS — F419 Anxiety disorder, unspecified: Secondary | ICD-10-CM | POA: Diagnosis not present

## 2022-08-21 DIAGNOSIS — M6281 Muscle weakness (generalized): Secondary | ICD-10-CM | POA: Diagnosis not present

## 2022-08-23 DIAGNOSIS — F209 Schizophrenia, unspecified: Secondary | ICD-10-CM | POA: Diagnosis not present

## 2022-08-23 DIAGNOSIS — I10 Essential (primary) hypertension: Secondary | ICD-10-CM | POA: Diagnosis not present

## 2022-08-26 DIAGNOSIS — M6281 Muscle weakness (generalized): Secondary | ICD-10-CM | POA: Diagnosis not present

## 2022-08-28 ENCOUNTER — Ambulatory Visit (HOSPITAL_COMMUNITY)
Admission: RE | Admit: 2022-08-28 | Discharge: 2022-08-28 | Disposition: A | Discharge status: Home / Self Care | Payer: Medicare Other | Source: Ambulatory Visit | Attending: Internal Medicine | Admitting: Internal Medicine

## 2022-08-28 DIAGNOSIS — M6281 Muscle weakness (generalized): Secondary | ICD-10-CM | POA: Diagnosis not present

## 2022-08-28 DIAGNOSIS — Z1231 Encounter for screening mammogram for malignant neoplasm of breast: Secondary | ICD-10-CM | POA: Diagnosis not present

## 2022-09-04 DIAGNOSIS — M6281 Muscle weakness (generalized): Secondary | ICD-10-CM | POA: Diagnosis not present

## 2022-09-06 ENCOUNTER — Other Ambulatory Visit (HOSPITAL_COMMUNITY): Payer: Self-pay | Admitting: Gerontology

## 2022-09-06 ENCOUNTER — Ambulatory Visit (HOSPITAL_COMMUNITY)
Admission: RE | Admit: 2022-09-06 | Discharge: 2022-09-06 | Disposition: A | Discharge status: Home / Self Care | Payer: Medicare Other | Source: Ambulatory Visit | Attending: Gerontology | Admitting: Gerontology

## 2022-09-06 DIAGNOSIS — R059 Cough, unspecified: Secondary | ICD-10-CM | POA: Diagnosis not present

## 2022-09-06 DIAGNOSIS — I10 Essential (primary) hypertension: Secondary | ICD-10-CM | POA: Diagnosis not present

## 2022-09-06 DIAGNOSIS — J9811 Atelectasis: Secondary | ICD-10-CM | POA: Diagnosis not present

## 2022-09-06 DIAGNOSIS — R062 Wheezing: Secondary | ICD-10-CM | POA: Diagnosis not present

## 2022-09-06 DIAGNOSIS — J029 Acute pharyngitis, unspecified: Secondary | ICD-10-CM | POA: Diagnosis not present

## 2022-09-09 DIAGNOSIS — M6281 Muscle weakness (generalized): Secondary | ICD-10-CM | POA: Diagnosis not present

## 2022-09-10 DIAGNOSIS — R7303 Prediabetes: Secondary | ICD-10-CM | POA: Diagnosis not present

## 2022-09-13 DIAGNOSIS — F419 Anxiety disorder, unspecified: Secondary | ICD-10-CM | POA: Diagnosis not present

## 2022-09-13 DIAGNOSIS — F02B18 Dementia in other diseases classified elsewhere, moderate, with other behavioral disturbance: Secondary | ICD-10-CM | POA: Diagnosis not present

## 2022-09-13 DIAGNOSIS — F633 Trichotillomania: Secondary | ICD-10-CM | POA: Diagnosis not present

## 2022-09-13 DIAGNOSIS — F5101 Primary insomnia: Secondary | ICD-10-CM | POA: Diagnosis not present

## 2022-09-13 DIAGNOSIS — F251 Schizoaffective disorder, depressive type: Secondary | ICD-10-CM | POA: Diagnosis not present

## 2022-09-16 DIAGNOSIS — M6281 Muscle weakness (generalized): Secondary | ICD-10-CM | POA: Diagnosis not present

## 2022-09-18 DIAGNOSIS — M6281 Muscle weakness (generalized): Secondary | ICD-10-CM | POA: Diagnosis not present

## 2022-09-23 DIAGNOSIS — M6281 Muscle weakness (generalized): Secondary | ICD-10-CM | POA: Diagnosis not present

## 2022-09-27 DIAGNOSIS — M6281 Muscle weakness (generalized): Secondary | ICD-10-CM | POA: Diagnosis not present

## 2022-09-30 DIAGNOSIS — M6281 Muscle weakness (generalized): Secondary | ICD-10-CM | POA: Diagnosis not present

## 2022-10-07 DIAGNOSIS — M6281 Muscle weakness (generalized): Secondary | ICD-10-CM | POA: Diagnosis not present

## 2022-10-08 DIAGNOSIS — I10 Essential (primary) hypertension: Secondary | ICD-10-CM | POA: Diagnosis not present

## 2022-10-08 DIAGNOSIS — F209 Schizophrenia, unspecified: Secondary | ICD-10-CM | POA: Diagnosis not present

## 2022-10-09 DIAGNOSIS — M6281 Muscle weakness (generalized): Secondary | ICD-10-CM | POA: Diagnosis not present

## 2022-10-10 DIAGNOSIS — F5101 Primary insomnia: Secondary | ICD-10-CM | POA: Diagnosis not present

## 2022-10-10 DIAGNOSIS — F02B18 Dementia in other diseases classified elsewhere, moderate, with other behavioral disturbance: Secondary | ICD-10-CM | POA: Diagnosis not present

## 2022-10-10 DIAGNOSIS — F419 Anxiety disorder, unspecified: Secondary | ICD-10-CM | POA: Diagnosis not present

## 2022-10-10 DIAGNOSIS — F633 Trichotillomania: Secondary | ICD-10-CM | POA: Diagnosis not present

## 2022-10-10 DIAGNOSIS — F251 Schizoaffective disorder, depressive type: Secondary | ICD-10-CM | POA: Diagnosis not present

## 2022-10-11 DIAGNOSIS — M6281 Muscle weakness (generalized): Secondary | ICD-10-CM | POA: Diagnosis not present

## 2022-10-14 DIAGNOSIS — M79675 Pain in left toe(s): Secondary | ICD-10-CM | POA: Diagnosis not present

## 2022-10-14 DIAGNOSIS — B351 Tinea unguium: Secondary | ICD-10-CM | POA: Diagnosis not present

## 2022-10-14 DIAGNOSIS — M79674 Pain in right toe(s): Secondary | ICD-10-CM | POA: Diagnosis not present

## 2022-10-14 DIAGNOSIS — M6281 Muscle weakness (generalized): Secondary | ICD-10-CM | POA: Diagnosis not present

## 2022-10-18 DIAGNOSIS — M6281 Muscle weakness (generalized): Secondary | ICD-10-CM | POA: Diagnosis not present

## 2022-10-21 DIAGNOSIS — M6281 Muscle weakness (generalized): Secondary | ICD-10-CM | POA: Diagnosis not present

## 2022-10-25 DIAGNOSIS — M6281 Muscle weakness (generalized): Secondary | ICD-10-CM | POA: Diagnosis not present

## 2022-10-28 DIAGNOSIS — M6281 Muscle weakness (generalized): Secondary | ICD-10-CM | POA: Diagnosis not present

## 2022-10-30 DIAGNOSIS — M6281 Muscle weakness (generalized): Secondary | ICD-10-CM | POA: Diagnosis not present

## 2022-11-04 DIAGNOSIS — M6281 Muscle weakness (generalized): Secondary | ICD-10-CM | POA: Diagnosis not present

## 2022-11-06 DIAGNOSIS — I10 Essential (primary) hypertension: Secondary | ICD-10-CM | POA: Diagnosis not present

## 2022-11-06 DIAGNOSIS — M6281 Muscle weakness (generalized): Secondary | ICD-10-CM | POA: Diagnosis not present

## 2022-11-06 DIAGNOSIS — F209 Schizophrenia, unspecified: Secondary | ICD-10-CM | POA: Diagnosis not present

## 2022-11-07 DIAGNOSIS — F02B18 Dementia in other diseases classified elsewhere, moderate, with other behavioral disturbance: Secondary | ICD-10-CM | POA: Diagnosis not present

## 2022-11-07 DIAGNOSIS — F251 Schizoaffective disorder, depressive type: Secondary | ICD-10-CM | POA: Diagnosis not present

## 2022-11-07 DIAGNOSIS — F419 Anxiety disorder, unspecified: Secondary | ICD-10-CM | POA: Diagnosis not present

## 2022-11-07 DIAGNOSIS — F633 Trichotillomania: Secondary | ICD-10-CM | POA: Diagnosis not present

## 2022-11-07 DIAGNOSIS — F5101 Primary insomnia: Secondary | ICD-10-CM | POA: Diagnosis not present

## 2022-11-10 DIAGNOSIS — M6281 Muscle weakness (generalized): Secondary | ICD-10-CM | POA: Diagnosis not present

## 2022-11-11 DIAGNOSIS — M6281 Muscle weakness (generalized): Secondary | ICD-10-CM | POA: Diagnosis not present

## 2022-11-13 DIAGNOSIS — M6281 Muscle weakness (generalized): Secondary | ICD-10-CM | POA: Diagnosis not present

## 2022-11-20 DIAGNOSIS — N399 Disorder of urinary system, unspecified: Secondary | ICD-10-CM | POA: Diagnosis not present

## 2022-11-22 DIAGNOSIS — M6281 Muscle weakness (generalized): Secondary | ICD-10-CM | POA: Diagnosis not present

## 2022-11-25 DIAGNOSIS — M6281 Muscle weakness (generalized): Secondary | ICD-10-CM | POA: Diagnosis not present

## 2022-11-27 DIAGNOSIS — M6281 Muscle weakness (generalized): Secondary | ICD-10-CM | POA: Diagnosis not present

## 2022-12-02 ENCOUNTER — Encounter (HOSPITAL_BASED_OUTPATIENT_CLINIC_OR_DEPARTMENT_OTHER): Payer: Self-pay | Admitting: Gerontology

## 2022-12-02 DIAGNOSIS — R600 Localized edema: Secondary | ICD-10-CM | POA: Diagnosis not present

## 2022-12-02 DIAGNOSIS — R109 Unspecified abdominal pain: Secondary | ICD-10-CM | POA: Diagnosis not present

## 2022-12-02 DIAGNOSIS — M6281 Muscle weakness (generalized): Secondary | ICD-10-CM | POA: Diagnosis not present

## 2022-12-02 DIAGNOSIS — N390 Urinary tract infection, site not specified: Secondary | ICD-10-CM | POA: Diagnosis not present

## 2022-12-03 ENCOUNTER — Other Ambulatory Visit (HOSPITAL_COMMUNITY): Payer: Self-pay | Admitting: Gerontology

## 2022-12-03 DIAGNOSIS — R109 Unspecified abdominal pain: Secondary | ICD-10-CM

## 2022-12-04 DIAGNOSIS — M6281 Muscle weakness (generalized): Secondary | ICD-10-CM | POA: Diagnosis not present

## 2022-12-04 DIAGNOSIS — I10 Essential (primary) hypertension: Secondary | ICD-10-CM | POA: Diagnosis not present

## 2022-12-04 DIAGNOSIS — R109 Unspecified abdominal pain: Secondary | ICD-10-CM | POA: Diagnosis not present

## 2022-12-05 DIAGNOSIS — F419 Anxiety disorder, unspecified: Secondary | ICD-10-CM | POA: Diagnosis not present

## 2022-12-05 DIAGNOSIS — F5101 Primary insomnia: Secondary | ICD-10-CM | POA: Diagnosis not present

## 2022-12-05 DIAGNOSIS — F251 Schizoaffective disorder, depressive type: Secondary | ICD-10-CM | POA: Diagnosis not present

## 2022-12-05 DIAGNOSIS — F633 Trichotillomania: Secondary | ICD-10-CM | POA: Diagnosis not present

## 2022-12-05 DIAGNOSIS — F02B18 Dementia in other diseases classified elsewhere, moderate, with other behavioral disturbance: Secondary | ICD-10-CM | POA: Diagnosis not present

## 2022-12-09 ENCOUNTER — Encounter (HOSPITAL_COMMUNITY): Payer: Self-pay

## 2022-12-09 ENCOUNTER — Emergency Department (HOSPITAL_COMMUNITY): Payer: Medicare Other

## 2022-12-09 ENCOUNTER — Other Ambulatory Visit: Payer: Self-pay

## 2022-12-09 ENCOUNTER — Emergency Department (HOSPITAL_COMMUNITY)
Admission: EM | Admit: 2022-12-09 | Discharge: 2022-12-09 | Disposition: A | Discharge status: Other Acute Inpatient Hospital | Payer: Medicare Other | Attending: Emergency Medicine | Admitting: Emergency Medicine

## 2022-12-09 DIAGNOSIS — F1320 Sedative, hypnotic or anxiolytic dependence, uncomplicated: Secondary | ICD-10-CM | POA: Diagnosis not present

## 2022-12-09 DIAGNOSIS — D259 Leiomyoma of uterus, unspecified: Secondary | ICD-10-CM | POA: Diagnosis not present

## 2022-12-09 DIAGNOSIS — R8281 Pyuria: Secondary | ICD-10-CM | POA: Insufficient documentation

## 2022-12-09 DIAGNOSIS — F0390 Unspecified dementia without behavioral disturbance: Secondary | ICD-10-CM | POA: Diagnosis not present

## 2022-12-09 DIAGNOSIS — R0789 Other chest pain: Secondary | ICD-10-CM | POA: Diagnosis not present

## 2022-12-09 DIAGNOSIS — I10 Essential (primary) hypertension: Secondary | ICD-10-CM | POA: Insufficient documentation

## 2022-12-09 DIAGNOSIS — Z79899 Other long term (current) drug therapy: Secondary | ICD-10-CM | POA: Diagnosis not present

## 2022-12-09 DIAGNOSIS — R079 Chest pain, unspecified: Secondary | ICD-10-CM | POA: Diagnosis not present

## 2022-12-09 DIAGNOSIS — R300 Dysuria: Secondary | ICD-10-CM | POA: Diagnosis not present

## 2022-12-09 DIAGNOSIS — R110 Nausea: Secondary | ICD-10-CM | POA: Insufficient documentation

## 2022-12-09 LAB — RAPID URINE DRUG SCREEN, HOSP PERFORMED
Amphetamines: NOT DETECTED
Barbiturates: POSITIVE — AB
Benzodiazepines: NOT DETECTED
Cocaine: NOT DETECTED
Opiates: NOT DETECTED
Tetrahydrocannabinol: NOT DETECTED

## 2022-12-09 LAB — URINALYSIS, ROUTINE W REFLEX MICROSCOPIC
Bilirubin Urine: NEGATIVE
Glucose, UA: NEGATIVE mg/dL
Hgb urine dipstick: NEGATIVE
Ketones, ur: NEGATIVE mg/dL
Nitrite: NEGATIVE
Protein, ur: NEGATIVE mg/dL
Specific Gravity, Urine: 1.006 (ref 1.005–1.030)
pH: 8 (ref 5.0–8.0)

## 2022-12-09 LAB — COMPREHENSIVE METABOLIC PANEL
ALT: 18 U/L (ref 0–44)
AST: 21 U/L (ref 15–41)
Albumin: 3.8 g/dL (ref 3.5–5.0)
Alkaline Phosphatase: 61 U/L (ref 38–126)
Anion gap: 10 (ref 5–15)
BUN: 11 mg/dL (ref 8–23)
CO2: 26 mmol/L (ref 22–32)
Calcium: 9.4 mg/dL (ref 8.9–10.3)
Chloride: 99 mmol/L (ref 98–111)
Creatinine, Ser: 0.49 mg/dL (ref 0.44–1.00)
GFR, Estimated: >60 mL/min (ref >60)
Glucose, Bld: 122 mg/dL — ABNORMAL HIGH (ref 70–99)
Potassium: 4.4 mmol/L (ref 3.5–5.1)
Sodium: 135 mmol/L (ref 135–145)
Total Bilirubin: 0.6 mg/dL (ref 0.3–1.2)
Total Protein: 7.7 g/dL (ref 6.5–8.1)

## 2022-12-09 LAB — TROPONIN I (HIGH SENSITIVITY)
Troponin I (High Sensitivity): 2 ng/L (ref <18)
Troponin I (High Sensitivity): 3 ng/L (ref <18)

## 2022-12-09 LAB — CBC WITH DIFFERENTIAL/PLATELET
Abs Immature Granulocytes: 0.01 10*3/uL (ref 0.00–0.07)
Basophils Absolute: 0 10*3/uL (ref 0.0–0.1)
Basophils Relative: 1 %
Eosinophils Absolute: 0.1 10*3/uL (ref 0.0–0.5)
Eosinophils Relative: 2 %
HCT: 36 % (ref 36.0–46.0)
Hemoglobin: 12 g/dL (ref 12.0–15.0)
Immature Granulocytes: 0 %
Lymphocytes Relative: 49 %
Lymphs Abs: 2.5 10*3/uL (ref 0.7–4.0)
MCH: 30.7 pg (ref 26.0–34.0)
MCHC: 33.3 g/dL (ref 30.0–36.0)
MCV: 92.1 fL (ref 80.0–100.0)
Monocytes Absolute: 0.5 10*3/uL (ref 0.1–1.0)
Monocytes Relative: 10 %
Neutro Abs: 1.9 10*3/uL (ref 1.7–7.7)
Neutrophils Relative %: 38 %
Platelets: 248 10*3/uL (ref 150–400)
RBC: 3.91 MIL/uL (ref 3.87–5.11)
RDW: 12.6 % (ref 11.5–15.5)
WBC: 5.1 10*3/uL (ref 4.0–10.5)
nRBC: 0 % (ref 0.0–0.2)

## 2022-12-09 LAB — MAGNESIUM: Magnesium: 1.7 mg/dL (ref 1.7–2.4)

## 2022-12-09 LAB — LIPASE, BLOOD: Lipase: 40 U/L (ref 11–51)

## 2022-12-09 MED ORDER — FENTANYL CITRATE PF 50 MCG/ML IJ SOSY
50.0000 ug | PREFILLED_SYRINGE | Freq: Once | INTRAMUSCULAR | Status: AC
  Administered 2022-12-09: 50 ug via INTRAVENOUS
  Filled 2022-12-09: qty 1

## 2022-12-09 MED ORDER — FAMOTIDINE IN NACL 20-0.9 MG/50ML-% IV SOLN
20.0000 mg | Freq: Once | INTRAVENOUS | Status: AC
  Administered 2022-12-09: 20 mg via INTRAVENOUS
  Filled 2022-12-09: qty 50

## 2022-12-09 MED ORDER — SUCRALFATE 1 GM/10ML PO SUSP: 1.0000 g | Freq: Three times a day (TID) | ORAL | 0 refills | Status: DC

## 2022-12-09 MED ORDER — CEPHALEXIN 500 MG PO CAPS: 500.0000 mg | ORAL_CAPSULE | Freq: Four times a day (QID) | ORAL | 0 refills | Status: AC

## 2022-12-09 MED ORDER — OXYCODONE-ACETAMINOPHEN 5-325 MG PO TABS
1.0000 | ORAL_TABLET | Freq: Once | ORAL | Status: AC
  Administered 2022-12-09: 1 via ORAL
  Filled 2022-12-09: qty 1

## 2022-12-09 MED ORDER — LIDOCAINE VISCOUS HCL 2 % MT SOLN
15.0000 mL | Freq: Once | OROMUCOSAL | Status: AC
  Administered 2022-12-09: 15 mL via ORAL
  Filled 2022-12-09: qty 15

## 2022-12-09 MED ORDER — IOHEXOL 350 MG/ML SOLN
80.0000 mL | Freq: Once | INTRAVENOUS | Status: AC | PRN
  Administered 2022-12-09: 80 mL via INTRAVENOUS

## 2022-12-09 MED ORDER — ALUM & MAG HYDROXIDE-SIMETH 200-200-20 MG/5ML PO SUSP
30.0000 mL | Freq: Once | ORAL | Status: AC
  Administered 2022-12-09: 30 mL via ORAL
  Filled 2022-12-09: qty 30

## 2022-12-09 MED ORDER — ONDANSETRON HCL 4 MG/2ML IJ SOLN
4.0000 mg | Freq: Once | INTRAMUSCULAR | Status: AC
  Administered 2022-12-09: 4 mg via INTRAVENOUS
  Filled 2022-12-09: qty 2

## 2022-12-09 MED ORDER — LACTATED RINGERS IV BOLUS
500.0000 mL | Freq: Once | INTRAVENOUS | Status: AC
  Administered 2022-12-09: 500 mL via INTRAVENOUS

## 2022-12-09 NOTE — ED Triage Notes (Signed)
[facility] called out [facility] stating pt is having chest wall pain. EMS informed that they gave 324 of aspirin otw to hospital. Pt has hx or MR, dementia, and HTN. Pt will answer commands and states she has a sharp feeling in the middle of her chest that goes through between her shoulder blades. Pt also states pressure in lower abdomen and states "it burns" when asked about urination.

## 2022-12-09 NOTE — Discharge Instructions (Addendum)
Continue taking your Protonix.  An additional medication called Carafate was sent to your pharmacy for treatment of gastritis.  Take this with meals.  Take as prescribed.  Follow-up with your gastroenterologist.  A prescription for an antibiotic was sent to your pharmacy for treatment of UTI.  Return to the emergency department for any new or worsening symptoms of concern.

## 2022-12-09 NOTE — ED Provider Notes (Signed)
[facility]MENT AT [facility] Provider Note   CSN: [id number] Arrival date & time: 12/09/22  0818     History  Chief Complaint  Patient presents with   Chest Pain    Stephanie Vincent is a 67 y.o. female.   Chest Pain Associated symptoms: back pain and nausea   Patient presents for chest pain.  Medical history includes schizophrenia, seizures, HTN, anemia, depression.  Patient endorses sharp pain in center of chest that radiates to upper back.  She also endorses recent dysuria.  Patient states that pain is currently 8/10 in severity.  She does endorse current nausea.  She was given 324 ASA prior to arrival.  History is limited by patient's baseline cognitive impairment.     Home Medications Prior to Admission medications   Medication Sig Start Date End Date Taking? Authorizing Provider  acetaminophen (TYLENOL) 650 MG CR tablet Take 650 mg by mouth every 8 (eight) hours as needed for pain or fever.   Yes [provider]  amLODipine (NORVASC) 5 MG tablet Take 5 mg by mouth daily.   Yes [provider]  ARIPiprazole (ABILIFY) 5 MG tablet Take 5 mg by mouth daily.   Yes [provider]  betamethasone dipropionate 0.05 % cream Apply 1 Application topically 2 (two) times daily. As needed for rash   Yes [provider]  cephALEXin (KEFLEX) 500 MG capsule Take 1 capsule (500 mg total) by mouth 4 (four) times daily for 5 days. 12/09/22 12/14/22 Yes Gloris Manchester, MD  Cholecalciferol (VITAMIN D3) 50 MCG (2000 UT) TABS Take 1 tablet by mouth daily. 06/14/22  Yes [provider]  divalproex (DEPAKOTE) 125 MG DR tablet Take 375 mg by mouth 3 (three) times daily.   Yes [provider]  donepezil (ARICEPT) 10 MG tablet Take 10 mg by mouth daily.  12/15/15  Yes [provider]  loperamide (IMODIUM) 2 MG capsule Take 2 mg by mouth as needed for diarrhea or loose stools. Take 2 caps with 1st loose stool then 1  capsule after each loose stool as needed do not exceed 5 doses in 24hrs   Yes [provider]  meloxicam (MOBIC) 15 MG tablet Take 15 mg by mouth daily.   Yes [provider]  metoprolol succinate (TOPROL-XL) 25 MG 24 hr tablet Take 1 tablet (25 mg total) by mouth daily. 10/16/20  Yes Mesner, Barbara Cower, MD  montelukast (SINGULAIR) 10 MG tablet Take 10 mg by mouth at bedtime.   Yes [provider]  OLANZapine zydis (ZYPREXA) 10 MG disintegrating tablet Take 5 mg by mouth 2 (two) times daily. 11/19/22  Yes [provider]  pantoprazole (PROTONIX) 40 MG tablet 1 PO BID FOR 10 DAYS THEN ONCE DAILY FOREVER Patient taking differently: Take 40 mg by mouth daily. 06/04/19  Yes Fields, Sandi L, MD  PHENobarbital (LUMINAL) 32.4 MG tablet Take 32.4 mg by mouth at bedtime.   Yes [provider]  sucralfate (CARAFATE) 1 GM/10ML suspension Take 10 mLs (1 g total) by mouth 4 (four) times daily -  with meals and at bedtime. 12/09/22  Yes Gloris Manchester, MD  traZODone (DESYREL) 150 MG tablet Take 150 mg by mouth at bedtime.   Yes [provider]  lisinopril (PRINIVIL,ZESTRIL) 10 MG tablet Take 10 mg by mouth daily.  12/15/15 10/16/20  [provider]      Allergies    Patient has no known allergies.    Review of Systems  Review of Systems  Cardiovascular:  Positive for chest pain.  Gastrointestinal:  Positive for nausea.  Musculoskeletal:  Positive for back pain.  All other systems reviewed and are negative.   Physical Exam Updated Vital Signs BP 132/89   Pulse 63   Temp 98 F (36.7 C) (Oral)   Resp 12   Ht 5\' 2"  (1.575 m)   Wt 89.3 kg   SpO2 97%   BMI 36.01 kg/m  Physical Exam Vitals and nursing note reviewed.  Constitutional:      General: She is not in acute distress.    Appearance: She is well-developed. She is not ill-appearing, toxic-appearing or diaphoretic.  HENT:     Head: Normocephalic and atraumatic.  Eyes:      Conjunctiva/sclera: Conjunctivae normal.  Cardiovascular:     Rate and Rhythm: Normal rate and regular rhythm.     Heart sounds: No murmur heard. Pulmonary:     Effort: Pulmonary effort is normal. No tachypnea or respiratory distress.     Breath sounds: Normal breath sounds. No decreased breath sounds, wheezing, rhonchi or rales.  Chest:     Chest wall: No tenderness.  Abdominal:     Palpations: Abdomen is soft.     Tenderness: There is no abdominal tenderness.  Musculoskeletal:        General: No swelling.     Cervical back: Normal range of motion and neck supple.     Right lower leg: No edema.     Left lower leg: No edema.  Skin:    General: Skin is warm and dry.     Capillary Refill: Capillary refill takes less than 2 seconds.     Coloration: Skin is not cyanotic or pale.  Neurological:     General: No focal deficit present.     Mental Status: She is alert.  Psychiatric:        Mood and Affect: Mood normal.        Behavior: Behavior normal.     ED Results / Procedures / Treatments   Labs (all labs ordered are listed, but only abnormal results are displayed) Labs Reviewed  COMPREHENSIVE METABOLIC PANEL - Abnormal; Notable for the following components:      Result Value   Glucose, Bld 122 (*)    All other components within normal limits  RAPID URINE DRUG SCREEN, HOSP PERFORMED - Abnormal; Notable for the following components:   Barbiturates POSITIVE (*)    All other components within normal limits  URINALYSIS, ROUTINE W REFLEX MICROSCOPIC - Abnormal; Notable for the following components:   Color, Urine STRAW (*)    Leukocytes,Ua SMALL (*)    Bacteria, UA RARE (*)    All other components within normal limits  MAGNESIUM  LIPASE, BLOOD  CBC WITH DIFFERENTIAL/PLATELET  I-STAT CHEM 8, ED  TROPONIN I (HIGH SENSITIVITY)  TROPONIN I (HIGH SENSITIVITY)    EKG EKG Interpretation  Date/Time:  Monday December 09 2022 08:29:02 EDT Ventricular Rate:  66 PR  Interval:  164 QRS Duration: 91 QT Interval:  394 QTC Calculation: 413 R Axis:   51 Text Interpretation: Sinus rhythm Confirmed by Gloris Manchester ([id number]) on 12/09/2022 9:30:44 AM  Radiology CT Angio Chest/Abd/Pel for Dissection W and/or Wo Contrast  Result Date: 12/09/2022 CLINICAL DATA:  Chest wall pain. EXAM: CT ANGIOGRAPHY CHEST, ABDOMEN AND PELVIS TECHNIQUE: Non-contrast CT of the chest was initially obtained. Multidetector CT imaging through the chest, abdomen and pelvis was performed using the standard protocol during bolus administration of  intravenous contrast. Multiplanar reconstructed images and MIPs were obtained and reviewed to evaluate the vascular anatomy. RADIATION DOSE REDUCTION: This exam was performed according to the departmental dose-optimization program which includes automated exposure control, adjustment of the mA and/or kV according to patient size and/or use of iterative reconstruction technique. CONTRAST:  80mL OMNIPAQUE IOHEXOL 350 MG/ML SOLN COMPARISON:  None Available. FINDINGS: CTA CHEST FINDINGS Cardiovascular: Satisfactory opacification of the pulmonary arteries to the segmental level. No evidence of pulmonary embolism. Normal heart size. No pericardial effusion. Mediastinum/Nodes: Multiple nodules are noted in the thyroid gland with the largest measuring 2.6 cm. Esophagus is unremarkable. No adenopathy is noted. Lungs/Pleura: Lungs are clear. No pleural effusion or pneumothorax. Musculoskeletal: No chest wall abnormality. No acute or significant osseous findings. Review of the MIP images confirms the above findings. CTA ABDOMEN AND PELVIS FINDINGS VASCULAR Aorta: Normal caliber aorta without aneurysm, dissection, vasculitis or significant stenosis. Celiac: Patent without evidence of aneurysm, dissection, vasculitis or significant stenosis. SMA: Patent without evidence of aneurysm, dissection, vasculitis or significant stenosis. Renals: Both renal arteries are patent without  evidence of aneurysm, dissection, vasculitis, fibromuscular dysplasia or significant stenosis. IMA: Patent without evidence of aneurysm, dissection, vasculitis or significant stenosis. Inflow: Patent without evidence of aneurysm, dissection, vasculitis or significant stenosis. Veins: No obvious venous abnormality within the limitations of this arterial phase study. Review of the MIP images confirms the above findings. NON-VASCULAR Hepatobiliary: No focal liver abnormality is seen. No gallstones, gallbladder wall thickening, or biliary dilatation. Pancreas: Unremarkable. No pancreatic ductal dilatation or surrounding inflammatory changes. Spleen: Normal in size without focal abnormality. Adrenals/Urinary Tract: Adrenal glands are unremarkable. Kidneys are normal, without renal calculi, focal lesion, or hydronephrosis. Bladder is unremarkable. Stomach/Bowel: Stomach is within normal limits. Appendix appears normal. No evidence of bowel wall thickening, distention, or inflammatory changes. Lymphatic: No adenopathy is noted. Reproductive: Multiple small calcified uterine fibroids are noted. No adnexal abnormality is noted. Other: No abdominal wall hernia or abnormality. No abdominopelvic ascites. Musculoskeletal: No acute or significant osseous findings. Review of the MIP images confirms the above findings. IMPRESSION: No evidence of thoracic or abdominal aortic dissection or aneurysm. Multiple thyroid nodules are noted bilaterally, the largest measuring 2.6 cm. Recommend thyroid ultrasound. (Ref: J Am Coll Radiol. 2015 Feb;12(2): 143-50). Multiple probable small calcified uterine fibroids are noted. Electronically Signed   By: Lupita Raider M.D.   On: 12/09/2022 14:06   DG Chest Portable 1 View  Result Date: 12/09/2022 CLINICAL DATA:  Dementia.  Chest pain. EXAM: PORTABLE CHEST 1 VIEW COMPARISON:  09/06/2022 FINDINGS: Heart size is normal. Mediastinal shadows are normal. Mild chronic bronchial thickening. No sign  of active consolidation or collapse. No effusion. No abnormal bone finding. IMPRESSION: Mild chronic bronchial thickening. No active process otherwise. Electronically Signed   By: Paulina Fusi M.D.   On: 12/09/2022 08:50    Procedures Procedures    Medications Ordered in ED Medications  fentaNYL (SUBLIMAZE) injection 50 mcg (50 mcg Intravenous Given 12/09/22 1304)  ondansetron (ZOFRAN) injection 4 mg (4 mg Intravenous Given 12/09/22 1303)  lactated ringers bolus 500 mL (0 mLs Intravenous Stopped 12/09/22 1405)  alum & mag hydroxide-simeth (MAALOX/MYLANTA) 200-200-20 MG/5ML suspension 30 mL (30 mLs Oral Given 12/09/22 1151)    And  lidocaine (XYLOCAINE) 2 % viscous mouth solution 15 mL (15 mLs Oral Given 12/09/22 1151)  famotidine (PEPCID) IVPB 20 mg premix (0 mg Intravenous Stopped 12/09/22 1334)  iohexol (OMNIPAQUE) 350 MG/ML injection 80 mL (80 mLs Intravenous Contrast Given 12/09/22  1351)  oxyCODONE-acetaminophen (PERCOCET/ROXICET) 5-325 MG per tablet 1 tablet (1 tablet Oral Given 12/09/22 1448)    ED Course/ Medical Decision Making/ A&P                             Medical Decision Making Amount and/or Complexity of Data Reviewed Labs: ordered. Radiology: ordered.  Risk OTC drugs. Prescription drug management.   This patient presents to the ED for concern of chest pain, this involves an extensive number of treatment options, and is a complaint that carries with it a high risk of complications and morbidity.  The differential diagnosis includes ACS, pancreatitis, dissection, PUD, GERD, pneumonia, PE   Co morbidities that complicate the patient evaluation  schizophrenia, seizures, HTN, anemia, depression   Additional history obtained:  Additional history obtained from N/A External records from outside source obtained and reviewed including EMR   Lab Tests:  I Ordered, and personally interpreted labs.  The pertinent results include: Normal kidney function, normal  electrolytes, normal lipase, normal troponins x 2, urinalysis does show slight pyuria and bacteria   Imaging Studies ordered:  I ordered imaging studies including CTA dissection study I independently visualized and interpreted imaging which showed no acute findings I agree with the radiologist interpretation   Cardiac Monitoring: / EKG:  The patient was maintained on a cardiac monitor.  I personally viewed and interpreted the cardiac monitored which showed an underlying rhythm of: Sinus rhythm   Problem List / ED Course / Critical interventions / Medication management  Patient presents for lower central chest pain with radiation to upper back and nausea starting this morning.  She was given 324 of ASA prior to arrival.  Vital signs on arrival are notable for mild hypertension.  Patient is well-appearing on exam.  No cardiac rubs or murmurs are appreciated.  Breathing is unlabored and SpO2 is normal on room air.  Fentanyl and Zofran ordered for symptomatic relief.  Diagnostic workup was initiated.  Serum lab work results are reassuring.  Initial troponin was normal.  On urinalysis, patient did have slight pyuria and bacteriuria.  Given her recent symptoms of dysuria, will prescribe antibiotics for treatment of UTI.  Following initial troponin, she was given GI cocktail.  She underwent CTA dissection study which did not show acute findings.  Second troponin was reassuring as well.  Patient was advised to continue Protonix and follow-up with GI.  She was discharged in stable condition.. I ordered medication including IV fluids for hydration; fentanyl, GI cocktail, and Percocet for analgesia; Zofran for nausea Reevaluation of the patient after these medicines showed that the patient improved I have reviewed the patients home medicines and have made adjustments as needed   Social Determinants of Health:  Cognitively impaired at baseline         Final Clinical Impression(s) / ED  Diagnoses Final diagnoses:  Chest pain, unspecified type    Rx / DC Orders ED Discharge Orders          Ordered    sucralfate (CARAFATE) 1 GM/10ML suspension  3 times daily with meals & bedtime        12/09/22 1432    cephALEXin (KEFLEX) 500 MG capsule  4 times daily        12/09/22 1432              Gloris Manchester, MD 12/09/22 1636

## 2022-12-09 NOTE — ED Notes (Addendum)
Gave report to [name] @ [name] who stated via phone she was sending someone to come pick up Stephanie Vincent.

## 2022-12-09 NOTE — ED Notes (Signed)
Medications on hold due to inability to obtain IV access.

## 2022-12-09 NOTE — ED Notes (Signed)
Unsuccessful IV attempt.  EMT-P made aware.

## 2022-12-11 DIAGNOSIS — M6281 Muscle weakness (generalized): Secondary | ICD-10-CM | POA: Diagnosis not present

## 2022-12-16 DIAGNOSIS — R600 Localized edema: Secondary | ICD-10-CM | POA: Diagnosis not present

## 2022-12-16 DIAGNOSIS — I10 Essential (primary) hypertension: Secondary | ICD-10-CM | POA: Diagnosis not present

## 2022-12-16 DIAGNOSIS — M6281 Muscle weakness (generalized): Secondary | ICD-10-CM | POA: Diagnosis not present

## 2022-12-16 DIAGNOSIS — K30 Functional dyspepsia: Secondary | ICD-10-CM | POA: Diagnosis not present

## 2022-12-16 DIAGNOSIS — R109 Unspecified abdominal pain: Secondary | ICD-10-CM | POA: Diagnosis not present

## 2022-12-16 DIAGNOSIS — J410 Simple chronic bronchitis: Secondary | ICD-10-CM | POA: Diagnosis not present

## 2022-12-17 NOTE — Progress Notes (Unsigned)
Referring Provider: Benetta Spar* Primary Care Physician:  Benetta Spar, MD Primary GI Physician: Dr. Marletta Lor  No chief complaint on file.   HPI:   Stephanie Vincent is a 67 y.o. female with history of anemia, colon polyps, H. pylori gastritis, presenting today at the request of [facility] emergency department for chest pain.  We have not seen patient since July 2021.  She was doing well at that time with no significant GI symptoms.  Plan to check for H. pylori eradication, otherwise recommended continuing Protonix daily.  It does not appear that H. pylori testing was ever completed.  Patient was seen in the emergency room 12/09/2022 for sharp pain in the center of her chest that radiated to her upper back.  Associated nausea.  Also with dysuria.  Laboratory evaluation unrevealing.  She did have slight pyuria and bacteriuria on UA.  CT angio chest abdomen pelvis with no acute findings, multiple thyroid nodules bilaterally, multiple probable small calcified uterine fibroids.  She was given a GI cocktail, pain medications, IV fluids, antiemetics.  With this, patient improved.  She was prescribed Keflex for possible UTI and Carafate at discharge.  Recommended follow-up with GI.    Today:    Procedures 06/01/2019: EGD: Performed due to vague upper abdominal pain.  Normal esophagus, gastritis s/p biopsy, normal duodenum.  Pathology positive for H. Pylori.  Treated with amoxicillin, Biaxin, and Protonix.  Recommended continuing Protonix daily forever after H. pylori treatment. Colonoscopy: Incomplete due to poor preparation.  Colonoscopy 06/02/2019: Redundant colon, 5 mm polyp in the descending colon, otherwise normal exam.  Pathology with tubular adenoma.  Recommended repeat exam in 5 years.  Past Medical History:  Diagnosis Date   H. pylori infection 06/01/2019   Treated with amoxicillin, Biaxin, Protonix   HTN (hypertension)    Manic depression (HCC)     Schizophrenia (HCC)    Seizures (HCC)     Past Surgical History:  Procedure Laterality Date   BIOPSY  06/01/2019   Procedure: BIOPSY;  Surgeon: West Bali, MD;  [facility];  Service: Endoscopy;;  gastric   COLONOSCOPY  02/2009   Dr. Darrick Penna: slightly torturous colon otherwise normal    COLONOSCOPY N/A 06/02/2019   Procedure: COLONOSCOPY;  Surgeon: Corbin Ade, MD; redundant colon, 5 mm polyp in the descending colon, otherwise normal exam.  Path with tubular adenoma.  Recommended repeat in 5 years.   COLONOSCOPY WITH PROPOFOL N/A 06/01/2019   Procedure: COLONOSCOPY WITH PROPOFOL;  Surgeon: West Bali, MD; incomplete due to poor prep   ESOPHAGOGASTRODUODENOSCOPY (EGD) WITH PROPOFOL N/A 06/01/2019   Procedure: ESOPHAGOGASTRODUODENOSCOPY (EGD) WITH PROPOFOL;  Surgeon: West Bali, MD;  Normal esophagus, gastritis s/p biopsy, normal duodenum.  Pathology positive for H. Pylori.  Treated with amoxicillin, Biaxin, and Protonix.    POLYPECTOMY  06/02/2019   Procedure: POLYPECTOMY;  Surgeon: Corbin Ade, MD;  [facility];  Service: Endoscopy;;  colon    Current Outpatient Medications  Medication Sig Dispense Refill   acetaminophen (TYLENOL) 650 MG CR tablet Take 650 mg by mouth every 8 (eight) hours as needed for pain or fever.     amLODipine (NORVASC) 5 MG tablet Take 5 mg by mouth daily.     ARIPiprazole (ABILIFY) 5 MG tablet Take 5 mg by mouth daily.     betamethasone dipropionate 0.05 % cream Apply 1 Application topically 2 (two) times daily. As needed for rash     Cholecalciferol (  VITAMIN D3) 50 MCG (2000 UT) TABS Take 1 tablet by mouth daily.     divalproex (DEPAKOTE) 125 MG DR tablet Take 375 mg by mouth 3 (three) times daily.     donepezil (ARICEPT) 10 MG tablet Take 10 mg by mouth daily.      loperamide (IMODIUM) 2 MG capsule Take 2 mg by mouth as needed for diarrhea or loose stools. Take 2 caps with 1st loose stool then 1 capsule after each  loose stool as needed do not exceed 5 doses in 24hrs     meloxicam (MOBIC) 15 MG tablet Take 15 mg by mouth daily.     metoprolol succinate (TOPROL-XL) 25 MG 24 hr tablet Take 1 tablet (25 mg total) by mouth daily. 30 tablet 1   montelukast (SINGULAIR) 10 MG tablet Take 10 mg by mouth at bedtime.     OLANZapine zydis (ZYPREXA) 10 MG disintegrating tablet Take 5 mg by mouth 2 (two) times daily.     pantoprazole (PROTONIX) 40 MG tablet 1 PO BID FOR 10 DAYS THEN ONCE DAILY FOREVER (Patient taking differently: Take 40 mg by mouth daily.) 30 tablet 11   PHENobarbital (LUMINAL) 32.4 MG tablet Take 32.4 mg by mouth at bedtime.     sucralfate (CARAFATE) 1 GM/10ML suspension Take 10 mLs (1 g total) by mouth 4 (four) times daily -  with meals and at bedtime. 420 mL 0   traZODone (DESYREL) 150 MG tablet Take 150 mg by mouth at bedtime.     No current facility-administered medications for this visit.    Allergies as of 12/19/2022   (No Known Allergies)    Family History  Problem Relation Age of Onset   Colon cancer Neg Hx    Colon polyps Neg Hx     Social History   Socioeconomic History   Marital status: Single    Spouse name: Not on file   Number of children: Not on file   Years of education: Not on file   Highest education level: Not on file  Occupational History   Not on file  Tobacco Use   Smoking status: Never   Smokeless tobacco: Never  Vaping Use   Vaping Use: Never used  Substance and Sexual Activity   Alcohol use: No   Drug use: No   Sexual activity: Not Currently  Other Topics Concern   Not on file  Social History Narrative   Not on file   Social Determinants of Health   Financial Resource Strain: Low Risk  (05/31/2019)   Overall Financial Resource Strain (CARDIA)    Difficulty of Paying Living Expenses: Not hard at all  Food Insecurity: No Food Insecurity (05/31/2019)   Hunger Vital Sign    Worried About Running Out of Food in the Last Year: Never true    Ran Out  of Food in the Last Year: Never true  Transportation Needs: No Transportation Needs (05/31/2019)   PRAPARE - Administrator, Civil Service (Medical): No    Lack of Transportation (Non-Medical): No  Physical Activity: Inactive (05/31/2019)   Exercise Vital Sign    Days of Exercise per Week: 0 days    Minutes of Exercise per Session: 0 min  Stress: No Stress Concern Present (05/31/2019)   [employer] of Occupational Health - Occupational Stress Questionnaire    Feeling of Stress : Not at all  Social Connections: Socially Isolated (05/31/2019)   Social Connection and Isolation Panel [NHANES]    Frequency of Communication  with Friends and Family: Never    Frequency of Social Gatherings with Friends and Family: Never    Attends Religious Services: Never    Database administrator or Organizations: No    Attends Engineer, structural: Never    Marital Status: Never married    Review of Systems: Gen: Denies fever, chills, anorexia. Denies fatigue, weakness, weight loss.  CV: Denies chest pain, palpitations, syncope, peripheral edema, and claudication. Resp: Denies dyspnea at rest, cough, wheezing, coughing up blood, and pleurisy. GI: Denies vomiting blood, jaundice, and fecal incontinence.   Denies dysphagia or odynophagia. Derm: Denies rash, itching, dry skin Psych: Denies depression, anxiety, memory loss, confusion. No homicidal or suicidal ideation.  Heme: Denies bruising, bleeding, and enlarged lymph nodes.  Physical Exam: There were no vitals taken for this visit. General:   Alert and oriented. No distress noted. Pleasant and cooperative.  Head:  Normocephalic and atraumatic. Eyes:  Conjuctiva clear without scleral icterus. Heart:  S1, S2 present without murmurs appreciated. Lungs:  Clear to auscultation bilaterally. No wheezes, rales, or rhonchi. No distress.  Abdomen:  +BS, soft, non-tender and non-distended. No rebound or guarding. No HSM or masses  noted. Msk:  Symmetrical without gross deformities. Normal posture. Extremities:  Without edema. Neurologic:  Alert and  oriented x4 Psych:  Normal mood and affect.    Assessment:     Plan:  ***   Ermalinda Memos, PA-C [facility] 12/19/2022

## 2022-12-17 NOTE — H&P (View-Only) (Signed)
  Referring Provider: Fanta, Tesfaye Demissie* Primary Care Physician:  Fanta, Tesfaye Demissie, MD Primary GI Physician: Dr. Carver  No chief complaint on file.   HPI:   Stephanie Vincent is a 66 y.o. female with history of anemia, colon polyps, H. pylori gastritis, presenting today at the request of [place] emergency department for chest pain.  We have not seen patient since July 2021.  She was doing well at that time with no significant GI symptoms.  Plan to check for H. pylori eradication, otherwise recommended continuing Protonix daily.  It does not appear that H. pylori testing was ever completed.  Patient was seen in the emergency room 12/09/2022 for sharp pain in the center of her chest that radiated to her upper back.  Associated nausea.  Also with dysuria.  Laboratory evaluation unrevealing.  She did have slight pyuria and bacteriuria on UA.  CT angio chest abdomen pelvis with no acute findings, multiple thyroid nodules bilaterally, multiple probable small calcified uterine fibroids.  She was given a GI cocktail, pain medications, IV fluids, antiemetics.  With this, patient improved.  She was prescribed Keflex for possible UTI and Carafate at discharge.  Recommended follow-up with GI.    Today:    Procedures 06/01/2019: EGD: Performed due to vague upper abdominal pain.  Normal esophagus, gastritis s/p biopsy, normal duodenum.  Pathology positive for H. Pylori.  Treated with amoxicillin, Biaxin, and Protonix.  Recommended continuing Protonix daily forever after H. pylori treatment. Colonoscopy: Incomplete due to poor preparation.  Colonoscopy 06/02/2019: Redundant colon, 5 mm polyp in the descending colon, otherwise normal exam.  Pathology with tubular adenoma.  Recommended repeat exam in 5 years.  Past Medical History:  Diagnosis Date   H. pylori infection 06/01/2019   Treated with amoxicillin, Biaxin, Protonix   HTN (hypertension)    Manic depression (HCC)     Schizophrenia (HCC)    Seizures (HCC)     Past Surgical History:  Procedure Laterality Date   BIOPSY  06/01/2019   Procedure: BIOPSY;  Surgeon: Fields, Sandi L, MD;  [facility];  Service: Endoscopy;;  gastric   COLONOSCOPY  02/2009   Dr. Fields: slightly torturous colon otherwise normal    COLONOSCOPY N/A 06/02/2019   Procedure: COLONOSCOPY;  Surgeon: Rourk, Robert M, MD; redundant colon, 5 mm polyp in the descending colon, otherwise normal exam.  Path with tubular adenoma.  Recommended repeat in 5 years.   COLONOSCOPY WITH PROPOFOL N/A 06/01/2019   Procedure: COLONOSCOPY WITH PROPOFOL;  Surgeon: Fields, Sandi L, MD; incomplete due to poor prep   ESOPHAGOGASTRODUODENOSCOPY (EGD) WITH PROPOFOL N/A 06/01/2019   Procedure: ESOPHAGOGASTRODUODENOSCOPY (EGD) WITH PROPOFOL;  Surgeon: Fields, Sandi L, MD;  Normal esophagus, gastritis s/p biopsy, normal duodenum.  Pathology positive for H. Pylori.  Treated with amoxicillin, Biaxin, and Protonix.    POLYPECTOMY  06/02/2019   Procedure: POLYPECTOMY;  Surgeon: Rourk, Robert M, MD;  [facility];  Service: Endoscopy;;  colon    Current Outpatient Medications  Medication Sig Dispense Refill   acetaminophen (TYLENOL) 650 MG CR tablet Take 650 mg by mouth every 8 (eight) hours as needed for pain or fever.     amLODipine (NORVASC) 5 MG tablet Take 5 mg by mouth daily.     ARIPiprazole (ABILIFY) 5 MG tablet Take 5 mg by mouth daily.     betamethasone dipropionate 0.05 % cream Apply 1 Application topically 2 (two) times daily. As needed for rash     Cholecalciferol (  VITAMIN D3) 50 MCG (2000 UT) TABS Take 1 tablet by mouth daily.     divalproex (DEPAKOTE) 125 MG DR tablet Take 375 mg by mouth 3 (three) times daily.     donepezil (ARICEPT) 10 MG tablet Take 10 mg by mouth daily.      loperamide (IMODIUM) 2 MG capsule Take 2 mg by mouth as needed for diarrhea or loose stools. Take 2 caps with 1st loose stool then 1 capsule after each  loose stool as needed do not exceed 5 doses in 24hrs     meloxicam (MOBIC) 15 MG tablet Take 15 mg by mouth daily.     metoprolol succinate (TOPROL-XL) 25 MG 24 hr tablet Take 1 tablet (25 mg total) by mouth daily. 30 tablet 1   montelukast (SINGULAIR) 10 MG tablet Take 10 mg by mouth at bedtime.     OLANZapine zydis (ZYPREXA) 10 MG disintegrating tablet Take 5 mg by mouth 2 (two) times daily.     pantoprazole (PROTONIX) 40 MG tablet 1 PO BID FOR 10 DAYS THEN ONCE DAILY FOREVER (Patient taking differently: Take 40 mg by mouth daily.) 30 tablet 11   PHENobarbital (LUMINAL) 32.4 MG tablet Take 32.4 mg by mouth at bedtime.     sucralfate (CARAFATE) 1 GM/10ML suspension Take 10 mLs (1 g total) by mouth 4 (four) times daily -  with meals and at bedtime. 420 mL 0   traZODone (DESYREL) 150 MG tablet Take 150 mg by mouth at bedtime.     No current facility-administered medications for this visit.    Allergies as of 12/19/2022   (No Known Allergies)    Family History  Problem Relation Age of Onset   Colon cancer Neg Hx    Colon polyps Neg Hx     Social History   Socioeconomic History   Marital status: Single    Spouse name: Not on file   Number of children: Not on file   Years of education: Not on file   Highest education level: Not on file  Occupational History   Not on file  Tobacco Use   Smoking status: Never   Smokeless tobacco: Never  Vaping Use   Vaping Use: Never used  Substance and Sexual Activity   Alcohol use: No   Drug use: No   Sexual activity: Not Currently  Other Topics Concern   Not on file  Social History Narrative   Not on file   Social Determinants of Health   Financial Resource Strain: Low Risk  (05/31/2019)   Overall Financial Resource Strain (CARDIA)    Difficulty of Paying Living Expenses: Not hard at all  Food Insecurity: No Food Insecurity (05/31/2019)   Hunger Vital Sign    Worried About Running Out of Food in the Last Year: Never true    Ran Out  of Food in the Last Year: Never true  Transportation Needs: No Transportation Needs (05/31/2019)   PRAPARE - Transportation    Lack of Transportation (Medical): No    Lack of Transportation (Non-Medical): No  Physical Activity: Inactive (05/31/2019)   Exercise Vital Sign    Days of Exercise per Week: 0 days    Minutes of Exercise per Session: 0 min  Stress: No Stress Concern Present (05/31/2019)   [employer]ational [employer] - Occupational Stress Questionnaire    Feeling of Stress : Not at all  Social Connections: Socially Isolated (05/31/2019)   Social Connection and Isolation Panel [NHANES]    Frequency of Communication   with Friends and Family: Never    Frequency of Social Gatherings with Friends and Family: Never    Attends Religious Services: Never    Active Member of Clubs or Organizations: No    Attends Club or Organization Meetings: Never    Marital Status: Never married    Review of Systems: Gen: Denies fever, chills, anorexia. Denies fatigue, weakness, weight loss.  CV: Denies chest pain, palpitations, syncope, peripheral edema, and claudication. Resp: Denies dyspnea at rest, cough, wheezing, coughing up blood, and pleurisy. GI: Denies vomiting blood, jaundice, and fecal incontinence.   Denies dysphagia or odynophagia. Derm: Denies rash, itching, dry skin Psych: Denies depression, anxiety, memory loss, confusion. No homicidal or suicidal ideation.  Heme: Denies bruising, bleeding, and enlarged lymph nodes.  Physical Exam: There were no vitals taken for this visit. General:   Alert and oriented. No distress noted. Pleasant and cooperative.  Head:  Normocephalic and atraumatic. Eyes:  Conjuctiva clear without scleral icterus. Heart:  S1, S2 present without murmurs appreciated. Lungs:  Clear to auscultation bilaterally. No wheezes, rales, or rhonchi. No distress.  Abdomen:  +BS, soft, non-tender and non-distended. No rebound or guarding. No HSM or masses  noted. Msk:  Symmetrical without gross deformities. Normal posture. Extremities:  Without edema. Neurologic:  Alert and  oriented x4 Psych:  Normal mood and affect.    Assessment:     Plan:  ***   Andri Prestia, PA-C [facility] 12/19/2022  

## 2022-12-18 DIAGNOSIS — M6281 Muscle weakness (generalized): Secondary | ICD-10-CM | POA: Diagnosis not present

## 2022-12-19 ENCOUNTER — Encounter: Payer: Self-pay | Admitting: Gastroenterology

## 2022-12-19 ENCOUNTER — Telehealth: Payer: Self-pay | Admitting: *Deleted

## 2022-12-19 ENCOUNTER — Ambulatory Visit (INDEPENDENT_AMBULATORY_CARE_PROVIDER_SITE_OTHER): Payer: Medicare Other | Admitting: Gastroenterology

## 2022-12-19 VITALS — BP 138/82 | HR 62 | Temp 97.8°F | Ht 66.0 in | Wt 197.8 lb

## 2022-12-19 DIAGNOSIS — R1013 Epigastric pain: Secondary | ICD-10-CM | POA: Insufficient documentation

## 2022-12-19 DIAGNOSIS — Z8619 Personal history of other infectious and parasitic diseases: Secondary | ICD-10-CM | POA: Diagnosis not present

## 2022-12-19 MED ORDER — PANTOPRAZOLE SODIUM 40 MG PO TBEC
40.0000 mg | DELAYED_RELEASE_TABLET | Freq: Two times a day (BID) | ORAL | 3 refills | Status: DC
Start: 2022-12-19 — End: 2023-04-02

## 2022-12-19 NOTE — Telephone Encounter (Signed)
Spoke with sylvia at [facility]. Scheduled for EGD with Dr. Marletta Lor, ASA 3 5/28. Aware will fax instructions/pre-op at [phone]

## 2022-12-19 NOTE — Patient Instructions (Addendum)
Increase pantoprazole to 40 mg twice daily before breakfast and dinner.  Continue Carafate 3 times daily before meals and at bedtime.  Stop meloxicam.  Avoid all NSAIDs.  We will arrange for you to have an upper endoscopy with Dr. Marletta Lor at [facility] in the near future.  We will follow-up with you in the office after your procedure.  Ermalinda Memos, PA-C [facility]

## 2022-12-20 ENCOUNTER — Encounter: Payer: Self-pay | Admitting: *Deleted

## 2022-12-23 ENCOUNTER — Other Ambulatory Visit: Payer: Self-pay | Admitting: Gastroenterology

## 2022-12-23 DIAGNOSIS — M6281 Muscle weakness (generalized): Secondary | ICD-10-CM | POA: Diagnosis not present

## 2022-12-25 DIAGNOSIS — M6281 Muscle weakness (generalized): Secondary | ICD-10-CM | POA: Diagnosis not present

## 2022-12-26 ENCOUNTER — Ambulatory Visit: Payer: Medicare Other | Admitting: Gastroenterology

## 2022-12-27 DIAGNOSIS — F02B18 Dementia in other diseases classified elsewhere, moderate, with other behavioral disturbance: Secondary | ICD-10-CM | POA: Diagnosis not present

## 2022-12-27 DIAGNOSIS — F5101 Primary insomnia: Secondary | ICD-10-CM | POA: Diagnosis not present

## 2022-12-27 DIAGNOSIS — F633 Trichotillomania: Secondary | ICD-10-CM | POA: Diagnosis not present

## 2022-12-27 DIAGNOSIS — F251 Schizoaffective disorder, depressive type: Secondary | ICD-10-CM | POA: Diagnosis not present

## 2022-12-27 DIAGNOSIS — F419 Anxiety disorder, unspecified: Secondary | ICD-10-CM | POA: Diagnosis not present

## 2023-01-01 DIAGNOSIS — M6281 Muscle weakness (generalized): Secondary | ICD-10-CM | POA: Diagnosis not present

## 2023-01-03 ENCOUNTER — Other Ambulatory Visit: Payer: Self-pay | Admitting: Gastroenterology

## 2023-01-03 DIAGNOSIS — M6281 Muscle weakness (generalized): Secondary | ICD-10-CM | POA: Diagnosis not present

## 2023-01-04 ENCOUNTER — Emergency Department (HOSPITAL_COMMUNITY): Payer: Medicare Other

## 2023-01-04 ENCOUNTER — Emergency Department (HOSPITAL_COMMUNITY)
Admission: EM | Admit: 2023-01-04 | Discharge: 2023-01-04 | Disposition: A | Discharge status: Home / Self Care | Payer: Medicare Other | Attending: Emergency Medicine | Admitting: Emergency Medicine

## 2023-01-04 ENCOUNTER — Other Ambulatory Visit: Payer: Self-pay

## 2023-01-04 ENCOUNTER — Encounter (HOSPITAL_COMMUNITY): Payer: Self-pay

## 2023-01-04 DIAGNOSIS — M25572 Pain in left ankle and joints of left foot: Secondary | ICD-10-CM | POA: Diagnosis not present

## 2023-01-04 DIAGNOSIS — W1830XA Fall on same level, unspecified, initial encounter: Secondary | ICD-10-CM | POA: Diagnosis not present

## 2023-01-04 DIAGNOSIS — M25552 Pain in left hip: Secondary | ICD-10-CM | POA: Diagnosis not present

## 2023-01-04 DIAGNOSIS — R42 Dizziness and giddiness: Secondary | ICD-10-CM

## 2023-01-04 DIAGNOSIS — M79605 Pain in left leg: Secondary | ICD-10-CM | POA: Insufficient documentation

## 2023-01-04 DIAGNOSIS — W19XXXA Unspecified fall, initial encounter: Secondary | ICD-10-CM

## 2023-01-04 DIAGNOSIS — I10 Essential (primary) hypertension: Secondary | ICD-10-CM | POA: Diagnosis not present

## 2023-01-04 DIAGNOSIS — M25562 Pain in left knee: Secondary | ICD-10-CM | POA: Diagnosis not present

## 2023-01-04 DIAGNOSIS — R4182 Altered mental status, unspecified: Secondary | ICD-10-CM | POA: Diagnosis not present

## 2023-01-04 LAB — URINALYSIS, ROUTINE W REFLEX MICROSCOPIC
Bilirubin Urine: NEGATIVE
Glucose, UA: NEGATIVE mg/dL
Hgb urine dipstick: NEGATIVE
Ketones, ur: NEGATIVE mg/dL
Leukocytes,Ua: NEGATIVE
Nitrite: NEGATIVE
Protein, ur: NEGATIVE mg/dL
Specific Gravity, Urine: 1.01 (ref 1.005–1.030)
pH: 7 (ref 5.0–8.0)

## 2023-01-04 LAB — BASIC METABOLIC PANEL
Anion gap: 10 (ref 5–15)
BUN: 11 mg/dL (ref 8–23)
CO2: 26 mmol/L (ref 22–32)
Calcium: 9.3 mg/dL (ref 8.9–10.3)
Chloride: 100 mmol/L (ref 98–111)
Creatinine, Ser: 0.47 mg/dL (ref 0.44–1.00)
GFR, Estimated: >60 mL/min (ref >60)
Glucose, Bld: 132 mg/dL — ABNORMAL HIGH (ref 70–99)
Potassium: 3.6 mmol/L (ref 3.5–5.1)
Sodium: 136 mmol/L (ref 135–145)

## 2023-01-04 LAB — CBC WITH DIFFERENTIAL/PLATELET
Abs Immature Granulocytes: 0.03 10*3/uL (ref 0.00–0.07)
Basophils Absolute: 0 10*3/uL (ref 0.0–0.1)
Basophils Relative: 1 %
Eosinophils Absolute: 0.1 10*3/uL (ref 0.0–0.5)
Eosinophils Relative: 1 %
HCT: 36.3 % (ref 36.0–46.0)
Hemoglobin: 12.1 g/dL (ref 12.0–15.0)
Immature Granulocytes: 1 %
Lymphocytes Relative: 44 %
Lymphs Abs: 3 10*3/uL (ref 0.7–4.0)
MCH: 31 pg (ref 26.0–34.0)
MCHC: 33.3 g/dL (ref 30.0–36.0)
MCV: 93.1 fL (ref 80.0–100.0)
Monocytes Absolute: 0.6 10*3/uL (ref 0.1–1.0)
Monocytes Relative: 9 %
Neutro Abs: 2.9 10*3/uL (ref 1.7–7.7)
Neutrophils Relative %: 44 %
Platelets: 289 10*3/uL (ref 150–400)
RBC: 3.9 MIL/uL (ref 3.87–5.11)
RDW: 12.7 % (ref 11.5–15.5)
WBC: 6.6 10*3/uL (ref 4.0–10.5)
nRBC: 0 % (ref 0.0–0.2)

## 2023-01-04 MED ORDER — KETOROLAC TROMETHAMINE 30 MG/ML IJ SOLN
15.0000 mg | Freq: Once | INTRAMUSCULAR | Status: DC
  Filled 2023-01-04: qty 1

## 2023-01-04 MED ORDER — KETOROLAC TROMETHAMINE 15 MG/ML IJ SOLN
15.0000 mg | Freq: Once | INTRAMUSCULAR | Status: AC
  Administered 2023-01-04: 15 mg via INTRAMUSCULAR

## 2023-01-04 NOTE — Discharge Instructions (Addendum)
You are seen in the emergency department for left leg pain after fall.  You had x-rays of your hip knee and ankle that showed arthritis but no fracture.  Your lab work was otherwise unremarkable.  Please follow-up with your regular doctor.  Return to the emergency department if any worsening or concerning symptoms

## 2023-01-04 NOTE — ED Provider Notes (Signed)
[place] EMERGENCY DEPARTMENT AT [facility] Provider Note   CSN: [id number] Arrival date & time: 01/04/23  1202     History  Chief Complaint  Patient presents with   Dizziness   Fall    Stephanie Vincent is a 66 y.o. female.  He has a history of schizophrenia depression seizure disorder.  She is a resident at a long-term care facility.  She has had multiple falls over the last few days due to her legs giving out.  She said this is an old problem.  Complaining of pain in her left hip and knee.  She denies hitting her head.  She does not think she passed out.  No chest pain or abdominal pain.  The history is provided by the patient.  Fall This is a recurrent problem. The problem has not changed since onset.Pertinent negatives include no chest pain, no abdominal pain, no headaches and no shortness of breath. The symptoms are aggravated by bending and twisting. Nothing relieves the symptoms. She has tried nothing for the symptoms. The treatment provided no relief.       Home Medications Prior to Admission medications   Medication Sig Start Date End Date Taking? Authorizing Provider  acetaminophen (TYLENOL) 650 MG CR tablet Take 650 mg by mouth every 8 (eight) hours as needed for pain or fever.    [provider]  amLODipine (NORVASC) 5 MG tablet Take 5 mg by mouth daily.    [provider]  ARIPiprazole (ABILIFY) 5 MG tablet Take 5 mg by mouth daily.    [provider]  betamethasone dipropionate 0.05 % cream Apply 1 Application topically 2 (two) times daily. As needed for rash    [provider]  Cholecalciferol (VITAMIN D3) 50 MCG (2000 UT) TABS Take 1 tablet by mouth daily. 06/14/22   [provider]  divalproex (DEPAKOTE) 125 MG DR tablet Take 375 mg by mouth 3 (three) times daily.    [provider]  donepezil (ARICEPT) 10 MG tablet Take 10 mg by mouth daily.  12/15/15   [provider]  loperamide  (IMODIUM) 2 MG capsule Take 2 mg by mouth as needed for diarrhea or loose stools. Take 2 caps with 1st loose stool then 1 capsule after each loose stool as needed do not exceed 5 doses in 24hrs    [provider]  meloxicam (MOBIC) 15 MG tablet Take 15 mg by mouth daily.    [provider]  metoprolol succinate (TOPROL-XL) 25 MG 24 hr tablet Take 1 tablet (25 mg total) by mouth daily. 10/16/20   Mesner, Barbara Cower, MD  montelukast (SINGULAIR) 10 MG tablet Take 10 mg by mouth at bedtime.    [provider]  OLANZapine zydis (ZYPREXA) 10 MG disintegrating tablet Take 5 mg by mouth 2 (two) times daily. 11/19/22   [provider]  pantoprazole (PROTONIX) 40 MG tablet Take 1 tablet (40 mg total) by mouth 2 (two) times daily before a meal. 12/19/22   Clearance Coots, Gwendalyn Ege, PA-C  PHENobarbital (LUMINAL) 32.4 MG tablet Take 32.4 mg by mouth at bedtime.    [provider]  sucralfate (CARAFATE) 1 GM/10ML suspension TAKE 10 ML (1 GM TOTAL) BY MOUTH FOUR TIMES DAILY WITH MEALS & AT BEDTIME. 12/24/22   Letta Median, PA-C  traZODone (DESYREL) 150 MG tablet Take 150 mg by mouth at bedtime.    [provider]  lisinopril (PRINIVIL,ZESTRIL) 10 MG tablet Take 10 mg by mouth daily.  12/15/15 10/16/20  [provider]      Allergies    Patient has no known allergies.    Review of Systems   Review of Systems  Constitutional:  Negative for fever.  Respiratory:  Negative for shortness of breath.   Cardiovascular:  Negative for chest pain.  Gastrointestinal:  Negative for abdominal pain.  Musculoskeletal:  Positive for gait problem.  Neurological:  Negative for headaches.    Physical Exam Updated Vital Signs BP (!) 157/92   Temp 98.7 F (37.1 C) (Oral)   Resp 19   Ht 5\' 6"  (1.676 m)   Wt 89.7 kg   BMI 31.93 kg/m  Physical Exam Vitals and nursing note reviewed.  Constitutional:      General: She is not in acute distress.    Appearance: Normal  appearance. She is well-developed.  HENT:     Head: Normocephalic and atraumatic.  Eyes:     Conjunctiva/sclera: Conjunctivae normal.  Cardiovascular:     Rate and Rhythm: Normal rate and regular rhythm.     Heart sounds: No murmur heard. Pulmonary:     Effort: Pulmonary effort is normal. No respiratory distress.     Breath sounds: Normal breath sounds.  Abdominal:     Palpations: Abdomen is soft.     Tenderness: There is no abdominal tenderness. There is no guarding or rebound.  Musculoskeletal:        General: No deformity. Normal range of motion.     Cervical back: Neck supple.  Skin:    General: Skin is warm and dry.     Capillary Refill: Capillary refill takes less than 2 seconds.  Neurological:     General: No focal deficit present.     Mental Status: She is alert.     Sensory: No sensory deficit.     Motor: No weakness.     Comments: She is awake and alert.  She is following commands and moving all extremities with intact strength.  She seems somewhat limited on using her lower extremities bilaterally.     ED Results / Procedures / Treatments   Labs (all labs ordered are listed, but only abnormal results are displayed) Labs Reviewed  BASIC METABOLIC PANEL - Abnormal; Notable for the following components:      Result Value   Glucose, Bld 132 (*)    All other components within normal limits  CBC WITH DIFFERENTIAL/PLATELET  URINALYSIS, ROUTINE W REFLEX MICROSCOPIC    EKG None  Radiology DG Ankle Complete Left  Result Date: 01/04/2023 CLINICAL DATA:  Fall with acute LEFT ankle pain.  Initial encounter. EXAM: LEFT ANKLE COMPLETE - 3 VIEW COMPARISON:  None Available. FINDINGS: There is no evidence of acute fracture or dislocation. Soft tissue swelling is noted. A small calcaneal plantar spur is present. No suspicious focal bony abnormalities are present. IMPRESSION: Soft tissue swelling without acute bony abnormality. Electronically Signed   By: Harmon Pier M.D.   On:  01/04/2023 14:10   DG Knee Complete 4 Views Left  Result Date: 01/04/2023 CLINICAL DATA:  Acute LEFT knee pain following fall. Initial encounter. EXAM: LEFT KNEE - COMPLETE 4+ VIEW COMPARISON:  None Available. FINDINGS: There is no evidence of acute fracture, dislocation or joint effusion. Tricompartmental joint space narrowing and osteophytosis noted. Chondrocalcinosis within the MEDIAL and LATERAL compartments noted. No suspicious focal bony lesions noted. IMPRESSION: 1. No acute abnormality. 2. 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 Electronically Signed   By: Henrietta Hoover.D.  On: 01/04/2023 14:06   DG Hip Unilat With Pelvis 2-3 Views Left  Result Date: 01/04/2023 CLINICAL DATA:  Fall.  Pain. EXAM: DG HIP (WITH OR WITHOUT PELVIS) 2-3V LEFT COMPARISON:  None Available. FINDINGS: There is no evidence of hip fracture or dislocation. There is no evidence of arthropathy or other focal bone abnormality. IMPRESSION: Negative left hip radiographs. Electronically Signed   By: Marin Roberts M.D.   On: 01/04/2023 14:02   CT Head Wo Contrast  Result Date: 01/04/2023 CLINICAL DATA:  Head trauma, abnormal mental status (Age 56-64y) EXAM: CT HEAD WITHOUT CONTRAST TECHNIQUE: Contiguous axial images were obtained from the base of the skull through the vertex without intravenous contrast. RADIATION DOSE REDUCTION: This exam was performed according to the departmental dose-optimization program which includes automated exposure control, adjustment of the mA and/or kV according to patient size and/or use of iterative reconstruction technique. COMPARISON:  None Available. FINDINGS: Brain: No evidence of acute infarction, hemorrhage, hydrocephalus, extra-axial collection or mass lesion/mass effect. Vascular: No hyperdense vessel or unexpected calcification. Skull: Normal. Negative for fracture or focal lesion. Sinuses/Orbits: No acute finding. Other: None. IMPRESSION: No acute  intracranial abnormality. Electronically Signed   By: Duanne Guess D.O.   On: 01/04/2023 13:15    Procedures Procedures    Medications Ordered in ED Medications  ketorolac (TORADOL) 15 MG/ML injection 15 mg (15 mg Intramuscular Given 01/04/23 1607)    ED Course/ Medical Decision Making/ A&P                             Medical Decision Making Amount and/or Complexity of Data Reviewed Labs: ordered. Radiology: ordered.   This patient complains of dizziness falls left leg pain; this involves an extensive number of treatment Options and is a complaint that carries with it a high risk of complications and morbidity. The differential includes dizziness, dehydration, stroke, bleed, fracture, dislocation, contusion, arthritis  I ordered, reviewed and interpreted labs, which included CBC normal chemistries normal other than mildly elevated glucose, urinalysis normal I ordered medication IV Toradol for pain and reviewed PMP when indicated. I ordered imaging studies which included head CT, x-rays of left hip knee and ankle and I independently    visualized and interpreted imaging which showed degenerative changes Additional history obtained from EMS Previous records obtained and reviewed in epic including prior orthopedic notes documenting chronic knee pain on the left Cardiac monitoring reviewed, normal sinus rhythm Social determinants considered, patient is physically inactive and socially isolated Critical Interventions: None  After the interventions stated above, I reevaluated the patient and found patient to be awake alert no distress Admission and further testing considered, no indications for admission or further workup at this time.  Will refer back to her facility where she can continue to be observed there.  She is already following with orthopedics.         Final Clinical Impression(s) / ED Diagnoses Final diagnoses:  Dizziness  Fall, initial encounter  Left leg  pain    Rx / DC Orders ED Discharge Orders     None         Terrilee Files, MD 01/04/23 1718

## 2023-01-04 NOTE — ED Notes (Signed)
Secretary contacted [facility] for transport to [facility].

## 2023-01-04 NOTE — ED Triage Notes (Signed)
Pt bib [facility] from [place]. Pt stated she has fell three times today d/t dizziness. Pt denies hitting her head or any pain d/t fall. Pt stated her right hip/leg has been hurting for a while now. Denies N/V.

## 2023-01-06 DIAGNOSIS — M6281 Muscle weakness (generalized): Secondary | ICD-10-CM | POA: Diagnosis not present

## 2023-01-08 ENCOUNTER — Encounter (HOSPITAL_COMMUNITY): Payer: Self-pay

## 2023-01-08 ENCOUNTER — Encounter (HOSPITAL_COMMUNITY)
Admission: RE | Admit: 2023-01-08 | Discharge: 2023-01-08 | Disposition: A | Discharge status: Home / Self Care | Payer: Medicare Other | Source: Ambulatory Visit | Attending: Internal Medicine | Admitting: Internal Medicine

## 2023-01-08 ENCOUNTER — Other Ambulatory Visit: Payer: Self-pay

## 2023-01-08 DIAGNOSIS — M6281 Muscle weakness (generalized): Secondary | ICD-10-CM | POA: Diagnosis not present

## 2023-01-08 NOTE — Patient Instructions (Signed)
    Stephanie Vincent  01/08/2023     @PREFPERIOPPHARMACY @   Your procedure is scheduled on 01/14/2023.   Report to [facility] at  1130  A.M.   Call this number if you have problems the morning of surgery:  [phone]  If you experience any cold or flu symptoms such as cough, fever, chills, shortness of breath, etc. between now and your scheduled surgery, please notify us at the above number.   Remember:  Follow the enclosed diet instructions from the office.     Take these medicines the morning of surgery with A SIP OF WATER          amlodipine, abilify, depakote, meloxicam (if needed), metoprolol, zyprexa, pantoprazole.        After patient takes her am meds, make copy of MAR so we can see what meds she took and what time they were taken.      Do not wear jewelry, make-up or nail polish, including gel polish,   artificial nails, or any other type of covering on natural nails (fingers and  toes).  Do not wear lotions, powders, or perfumes, or deodorant.  Do not shave 48 hours prior to surgery.  Men may shave face and neck.  Do not bring valuables to the hospital.  [facility] is not responsible for any belongings or valuables.    Contacts, dentures or bridgework may not be worn into surgery.  Leave your suitcase in the car.  After surgery it may be brought to your room.  For patients admitted to the hospital, discharge time will be determined by your treatment team.  Patients discharged the day of surgery will not be allowed to drive home.    Special instructions:   DO NOT smoke tobacco or vape for 24 hours before your procedure.   Please read over the following fact sheets that you were given.  Anesthesia Post-op Instructions and Care and Recovery After Surgery

## 2023-01-08 NOTE — Pre-Procedure Instructions (Signed)
Spoke with Misty Stanley at [facility] to let them know patient did not need to come for pre-op due to recent labs. I faxed diet instructions and pre-op information to them at [phone].

## 2023-01-14 ENCOUNTER — Other Ambulatory Visit: Payer: Self-pay

## 2023-01-14 ENCOUNTER — Ambulatory Visit (HOSPITAL_COMMUNITY): Payer: Medicare Other | Admitting: Anesthesiology

## 2023-01-14 ENCOUNTER — Encounter (HOSPITAL_COMMUNITY)
Admission: RE | Disposition: A | Discharge status: Home / Self Care | Payer: Self-pay | Source: Home / Self Care | Attending: Internal Medicine

## 2023-01-14 ENCOUNTER — Encounter (HOSPITAL_COMMUNITY): Payer: Self-pay

## 2023-01-14 ENCOUNTER — Ambulatory Visit (HOSPITAL_COMMUNITY)
Admission: RE | Admit: 2023-01-14 | Discharge: 2023-01-14 | Disposition: A | Discharge status: Home / Self Care | Payer: Medicare Other | Attending: Internal Medicine | Admitting: Internal Medicine

## 2023-01-14 ENCOUNTER — Ambulatory Visit (HOSPITAL_BASED_OUTPATIENT_CLINIC_OR_DEPARTMENT_OTHER): Payer: Medicare Other | Admitting: Anesthesiology

## 2023-01-14 DIAGNOSIS — Z09 Encounter for follow-up examination after completed treatment for conditions other than malignant neoplasm: Secondary | ICD-10-CM | POA: Insufficient documentation

## 2023-01-14 DIAGNOSIS — K2950 Unspecified chronic gastritis without bleeding: Secondary | ICD-10-CM | POA: Insufficient documentation

## 2023-01-14 DIAGNOSIS — I10 Essential (primary) hypertension: Secondary | ICD-10-CM | POA: Diagnosis not present

## 2023-01-14 DIAGNOSIS — Z8619 Personal history of other infectious and parasitic diseases: Secondary | ICD-10-CM | POA: Insufficient documentation

## 2023-01-14 DIAGNOSIS — D649 Anemia, unspecified: Secondary | ICD-10-CM | POA: Diagnosis not present

## 2023-01-14 DIAGNOSIS — F319 Bipolar disorder, unspecified: Secondary | ICD-10-CM | POA: Insufficient documentation

## 2023-01-14 DIAGNOSIS — K222 Esophageal obstruction: Secondary | ICD-10-CM | POA: Insufficient documentation

## 2023-01-14 DIAGNOSIS — Z79899 Other long term (current) drug therapy: Secondary | ICD-10-CM | POA: Insufficient documentation

## 2023-01-14 DIAGNOSIS — K2970 Gastritis, unspecified, without bleeding: Secondary | ICD-10-CM

## 2023-01-14 DIAGNOSIS — F209 Schizophrenia, unspecified: Secondary | ICD-10-CM | POA: Diagnosis not present

## 2023-01-14 DIAGNOSIS — M6281 Muscle weakness (generalized): Secondary | ICD-10-CM | POA: Diagnosis not present

## 2023-01-14 DIAGNOSIS — R1013 Epigastric pain: Secondary | ICD-10-CM | POA: Diagnosis not present

## 2023-01-14 HISTORY — PX: BIOPSY: SHX5522

## 2023-01-14 HISTORY — PX: ESOPHAGOGASTRODUODENOSCOPY (EGD) WITH PROPOFOL: SHX5813

## 2023-01-14 SURGERY — ESOPHAGOGASTRODUODENOSCOPY (EGD) WITH PROPOFOL
Anesthesia: General

## 2023-01-14 MED ORDER — LIDOCAINE HCL (CARDIAC) PF 100 MG/5ML IV SOSY
PREFILLED_SYRINGE | INTRAVENOUS | Status: DC | PRN
  Administered 2023-01-14: 50 mg via INTRAVENOUS

## 2023-01-14 MED ORDER — LACTATED RINGERS IV SOLN: INTRAVENOUS | Status: DC | PRN

## 2023-01-14 MED ORDER — PROPOFOL 10 MG/ML IV BOLUS
INTRAVENOUS | Status: DC | PRN
  Administered 2023-01-14: 120 mg via INTRAVENOUS

## 2023-01-14 NOTE — Transfer of Care (Signed)
Immediate Anesthesia Transfer of Care Note  Patient: Stephanie Vincent  Procedure(s) Performed: ESOPHAGOGASTRODUODENOSCOPY (EGD) WITH PROPOFOL BIOPSY  Patient Location: Short Stay  Anesthesia Type:General  Level of Consciousness: drowsy  Airway & Oxygen Therapy: Patient Spontanous Breathing  Post-op Assessment: Report given to RN and Post -op Vital signs reviewed and stable  Post vital signs: Reviewed and stable  Last Vitals:  Vitals Value Taken Time  BP 109/65 01/14/23 1311  Temp 37.1 C 01/14/23 1311  Pulse 68 01/14/23 1311  Resp 20 01/14/23 1311  SpO2 98 % 01/14/23 1311    Last Pain:  Vitals:   01/14/23 1311  TempSrc: Oral  PainSc: 0-No pain      Patients Stated Pain Goal: (P) 4 (01/14/23 1215)  Complications: No notable events documented.

## 2023-01-14 NOTE — Op Note (Signed)
[facility] Patient Name: Stephanie Vincent Procedure Date: 01/14/2023 12:47 PM MRN: [id number] Date of Birth: 04/21/1956 Attending MD: Hennie Duos. Marletta Lor , [place], [id number] CSN: [id number] Age: 67 Admit Type: Outpatient Procedure:                Upper GI endoscopy Indications:              Epigastric abdominal pain Providers:                Hennie Duos. Marletta Lor, DO, Jannett Celestine, RN, Edrick Kins, RN Referring MD:              Medicines:                See the Anesthesia note for documentation of the                            administered medications Complications:            No immediate complications. Estimated Blood Loss:     Estimated blood loss was minimal. Procedure:                Pre-Anesthesia Assessment:                           - The anesthesia plan was to use monitored                            anesthesia care (MAC).                           After obtaining informed consent, the endoscope was                            passed under direct vision. Throughout the                            procedure, the patient's blood pressure, pulse, and                            oxygen saturations were monitored continuously. The                            [id number]-H190 ([id number]) scope was introduced through the                            mouth, and advanced to the second part of duodenum.                            The upper GI endoscopy was accomplished without                            difficulty. The patient tolerated the procedure                            well. Scope In: 1:03:03  PM Scope Out: 1:05:33 PM Total Procedure Duration: 0 hours 2 minutes 30 seconds  Findings:      A non-obstructing and mild Schatzki ring was found in the lower third of       the esophagus.      Patchy mild inflammation characterized by erythema was found in the       stomach. Biopsies were taken with a cold forceps for Helicobacter pylori       testing.      The duodenal  bulb, first portion of the duodenum and second portion of       the duodenum were normal. Impression:               - Non-obstructing and mild Schatzki ring.                           - Gastritis. Biopsied.                           - Normal duodenal bulb, first portion of the                            duodenum and second portion of the duodenum. Moderate Sedation:      Per Anesthesia Care Recommendation:           - Patient has a contact number available for                            emergencies. The signs and symptoms of potential                            delayed complications were discussed with the                            patient. Return to normal activities tomorrow.                            Written discharge instructions were provided to the                            patient.                           - Resume previous diet.                           - Continue present medications.                           - Await pathology results.                           - Return to GI clinic in 3 months.                           - Use Protonix (pantoprazole) 40 mg PO BID. Procedure Code(s):        --- Professional ---  16109, Esophagogastroduodenoscopy, flexible,                            transoral; with biopsy, single or multiple Diagnosis Code(s):        --- Professional ---                           K22.2, Esophageal obstruction                           K29.70, Gastritis, unspecified, without bleeding                           R10.13, Epigastric pain CPT copyright 2022 American Medical Association. All rights reserved. The codes documented in this report are preliminary and upon coder review may  be revised to meet current compliance requirements. Hennie Duos. Marletta Lor, DO Hennie Duos. Marletta Lor, DO 01/14/2023 1:08:20 PM This report has been signed electronically. Number of Addenda: 0

## 2023-01-14 NOTE — Anesthesia Procedure Notes (Signed)
Date/Time: 01/14/2023 1:04 PM  Performed by: Julian Reil, CRNAPre-anesthesia Checklist: Patient identified, Emergency Drugs available, Suction available and Patient being monitored Patient Re-evaluated:Patient Re-evaluated prior to induction Oxygen Delivery Method: Nasal cannula Induction Type: IV induction Placement Confirmation: positive ETCO2

## 2023-01-14 NOTE — Discharge Instructions (Signed)
EGD Discharge instructions Please read the instructions outlined below and refer to this sheet in the next few weeks. These discharge instructions provide you with general information on caring for yourself after you leave the hospital. Your doctor may also give you specific instructions. While your treatment has been planned according to the most current medical practices available, unavoidable complications occasionally occur. If you have any problems or questions after discharge, please call your doctor. ACTIVITY You may resume your regular activity but move at a slower pace for the next 24 hours.  Take frequent rest periods for the next 24 hours.  Walking will help expel (get rid of) the air and reduce the bloated feeling in your abdomen.  No driving for 24 hours (because of the anesthesia (medicine) used during the test).  You may shower.  Do not sign any important legal documents or operate any machinery for 24 hours (because of the anesthesia used during the test).  NUTRITION Drink plenty of fluids.  You may resume your normal diet.  Begin with a light meal and progress to your normal diet.  Avoid alcoholic beverages for 24 hours or as instructed by your caregiver.  MEDICATIONS You may resume your normal medications unless your caregiver tells you otherwise.  WHAT YOU CAN EXPECT TODAY You may experience abdominal discomfort such as a feeling of fullness or "gas" pains.  FOLLOW-UP Your doctor will discuss the results of your test with you.  SEEK IMMEDIATE MEDICAL ATTENTION IF ANY OF THE FOLLOWING OCCUR: Excessive nausea (feeling sick to your stomach) and/or vomiting.  Severe abdominal pain and distention (swelling).  Trouble swallowing.  Temperature over 101 F (37.8 C).  Rectal bleeding or vomiting of blood.    Your EGD revealed mild amount inflammation in your stomach.  I took biopsies of this to rule out infection with a bacteria called H. pylori.  Await pathology results, my  office will contact you.  Esophagus and small bowel normal.  Continue on pantoprazole twice daily.  Continue Carafate.  Follow-up in GI office in 2 to 3 months.  I hope you have a great rest of your week!  Hennie Duos. Marletta Lor, D.O. Gastroenterology and Hepatology [facility]

## 2023-01-14 NOTE — Interval H&P Note (Signed)
History and Physical Interval Note:  01/14/2023 12:46 PM  Stephanie Vincent  has presented today for surgery, with the diagnosis of epigastric pain, hx H pylori.  The various methods of treatment have been discussed with the patient and family. After consideration of risks, benefits and other options for treatment, the patient has consented to  Procedure(s) with comments: ESOPHAGOGASTRODUODENOSCOPY (EGD) WITH PROPOFOL (N/A) - 1:30pm, asa 3 as a surgical intervention.  The patient's history has been reviewed, patient examined, no change in status, stable for surgery.  I have reviewed the patient's chart and labs.  Questions were answered to the patient's satisfaction.     Lanelle Bal

## 2023-01-14 NOTE — Anesthesia Preprocedure Evaluation (Signed)
Anesthesia Evaluation  Patient identified by MRN, date of birth, ID band Patient awake    Reviewed: Allergy & Precautions, H&P , NPO status , Patient's Chart, lab work & pertinent test results, reviewed documented beta blocker date and time   Airway Mallampati: II  TM Distance: >3 FB Neck ROM: full    Dental no notable dental hx.    Pulmonary neg pulmonary ROS   Pulmonary exam normal breath sounds clear to auscultation       Cardiovascular Exercise Tolerance: Good hypertension, negative cardio ROS  Rhythm:regular Rate:Normal     Neuro/Psych Seizures -,  PSYCHIATRIC DISORDERS  Depression Bipolar Disorder Schizophrenia  negative neurological ROS  negative psych ROS   GI/Hepatic negative GI ROS, Neg liver ROS,,,  Endo/Other  negative endocrine ROS    Renal/GU negative Renal ROS  negative genitourinary   Musculoskeletal   Abdominal   Peds  Hematology negative hematology ROS (+) Blood dyscrasia, anemia   Anesthesia Other Findings   Reproductive/Obstetrics negative OB ROS                             Anesthesia Physical Anesthesia Plan  ASA: 3  Anesthesia Plan: General   Post-op Pain Management:    Induction:   PONV Risk Score and Plan: Propofol infusion  Airway Management Planned:   Additional Equipment:   Intra-op Plan:   Post-operative Plan:   Informed Consent: I have reviewed the patients History and Physical, chart, labs and discussed the procedure including the risks, benefits and alternatives for the proposed anesthesia with the patient or authorized representative who has indicated his/her understanding and acceptance.     Dental Advisory Given  Plan Discussed with: CRNA  Anesthesia Plan Comments:        Anesthesia Quick Evaluation

## 2023-01-15 DIAGNOSIS — M6281 Muscle weakness (generalized): Secondary | ICD-10-CM | POA: Diagnosis not present

## 2023-01-16 DIAGNOSIS — I10 Essential (primary) hypertension: Secondary | ICD-10-CM | POA: Diagnosis not present

## 2023-01-16 DIAGNOSIS — M6281 Muscle weakness (generalized): Secondary | ICD-10-CM | POA: Diagnosis not present

## 2023-01-16 DIAGNOSIS — M170 Bilateral primary osteoarthritis of knee: Secondary | ICD-10-CM | POA: Diagnosis not present

## 2023-01-16 DIAGNOSIS — M25562 Pain in left knee: Secondary | ICD-10-CM | POA: Diagnosis not present

## 2023-01-16 DIAGNOSIS — R296 Repeated falls: Secondary | ICD-10-CM | POA: Diagnosis not present

## 2023-01-16 LAB — SURGICAL PATHOLOGY

## 2023-01-20 ENCOUNTER — Encounter (HOSPITAL_COMMUNITY): Payer: Self-pay | Admitting: Internal Medicine

## 2023-01-20 DIAGNOSIS — M6281 Muscle weakness (generalized): Secondary | ICD-10-CM | POA: Diagnosis not present

## 2023-01-21 DIAGNOSIS — M6281 Muscle weakness (generalized): Secondary | ICD-10-CM | POA: Diagnosis not present

## 2023-01-22 DIAGNOSIS — M6281 Muscle weakness (generalized): Secondary | ICD-10-CM | POA: Diagnosis not present

## 2023-01-22 NOTE — Anesthesia Postprocedure Evaluation (Signed)
Anesthesia Post Note  Patient: Stephanie Vincent  Procedure(s) Performed: ESOPHAGOGASTRODUODENOSCOPY (EGD) WITH PROPOFOL BIOPSY  Patient location during evaluation: Phase II Anesthesia Type: General Level of consciousness: awake Pain management: pain level controlled Vital Signs Assessment: post-procedure vital signs reviewed and stable Respiratory status: spontaneous breathing and respiratory function stable Cardiovascular status: blood pressure returned to baseline and stable Postop Assessment: no headache and no apparent nausea or vomiting Anesthetic complications: no Comments: Late entry   No notable events documented.   Last Vitals:  Vitals:   01/14/23 1215 01/14/23 1311  BP: (!) (P) 142/82 109/65  Pulse: (P) 70 68  Resp: (P) 17 20  Temp:  37.1 C  SpO2: (P) 98% 98%    Last Pain:  Vitals:   01/14/23 1311  TempSrc: Oral  PainSc: 0-No pain                 Windell Norfolk

## 2023-01-22 NOTE — Anesthesia Postprocedure Evaluation (Signed)
Anesthesia Post Note  Patient: Stephanie Vincent  Procedure(s) Performed: ESOPHAGOGASTRODUODENOSCOPY (EGD) WITH PROPOFOL BIOPSY  Patient location during evaluation: Phase II Anesthesia Type: General Level of consciousness: awake Pain management: pain level controlled Vital Signs Assessment: post-procedure vital signs reviewed and stable Respiratory status: spontaneous breathing and respiratory function stable Cardiovascular status: blood pressure returned to baseline and stable Postop Assessment: no headache and no apparent nausea or vomiting Anesthetic complications: no Comments: Late entry   No notable events documented.   Last Vitals:  Vitals:   01/14/23 1215 01/14/23 1311  BP: (!) (P) 142/82 109/65  Pulse: (P) 70 68  Resp: (P) 17 20  Temp:  37.1 C  SpO2: (P) 98% 98%    Last Pain:  Vitals:   01/14/23 1311  TempSrc: Oral  PainSc: 0-No pain                 Harlin Mazzoni J Ayansh Feutz      

## 2023-01-23 ENCOUNTER — Other Ambulatory Visit (HOSPITAL_COMMUNITY): Payer: Self-pay | Admitting: Internal Medicine

## 2023-01-23 ENCOUNTER — Inpatient Hospital Stay (HOSPITAL_COMMUNITY): Admit: 2023-01-23 | Payer: Medicare Other

## 2023-01-23 ENCOUNTER — Ambulatory Visit (HOSPITAL_COMMUNITY)
Admission: RE | Admit: 2023-01-23 | Discharge: 2023-01-23 | Disposition: A | Discharge status: Home / Self Care | Payer: Medicare Other | Source: Ambulatory Visit | Attending: Internal Medicine | Admitting: Internal Medicine

## 2023-01-23 DIAGNOSIS — R2231 Localized swelling, mass and lump, right upper limb: Secondary | ICD-10-CM | POA: Diagnosis not present

## 2023-01-23 DIAGNOSIS — F02B18 Dementia in other diseases classified elsewhere, moderate, with other behavioral disturbance: Secondary | ICD-10-CM | POA: Diagnosis not present

## 2023-01-23 DIAGNOSIS — F419 Anxiety disorder, unspecified: Secondary | ICD-10-CM | POA: Diagnosis not present

## 2023-01-23 DIAGNOSIS — M79641 Pain in right hand: Secondary | ICD-10-CM

## 2023-01-23 DIAGNOSIS — F633 Trichotillomania: Secondary | ICD-10-CM | POA: Diagnosis not present

## 2023-01-23 DIAGNOSIS — M6281 Muscle weakness (generalized): Secondary | ICD-10-CM | POA: Diagnosis not present

## 2023-01-23 DIAGNOSIS — I10 Essential (primary) hypertension: Secondary | ICD-10-CM | POA: Diagnosis not present

## 2023-01-23 DIAGNOSIS — F251 Schizoaffective disorder, depressive type: Secondary | ICD-10-CM | POA: Diagnosis not present

## 2023-01-23 DIAGNOSIS — M25541 Pain in joints of right hand: Secondary | ICD-10-CM | POA: Diagnosis not present

## 2023-01-23 DIAGNOSIS — F5101 Primary insomnia: Secondary | ICD-10-CM | POA: Diagnosis not present

## 2023-01-26 DIAGNOSIS — M6281 Muscle weakness (generalized): Secondary | ICD-10-CM | POA: Diagnosis not present

## 2023-01-27 DIAGNOSIS — M6281 Muscle weakness (generalized): Secondary | ICD-10-CM | POA: Diagnosis not present

## 2023-01-29 ENCOUNTER — Ambulatory Visit (HOSPITAL_COMMUNITY)
Admission: RE | Admit: 2023-01-29 | Discharge: 2023-01-29 | Disposition: A | Discharge status: Home / Self Care | Payer: Medicare Other | Source: Ambulatory Visit | Attending: Gerontology | Admitting: Gerontology

## 2023-01-29 DIAGNOSIS — R109 Unspecified abdominal pain: Secondary | ICD-10-CM | POA: Insufficient documentation

## 2023-01-29 DIAGNOSIS — M6281 Muscle weakness (generalized): Secondary | ICD-10-CM | POA: Diagnosis not present

## 2023-01-29 DIAGNOSIS — K760 Fatty (change of) liver, not elsewhere classified: Secondary | ICD-10-CM | POA: Diagnosis not present

## 2023-01-31 DIAGNOSIS — M79675 Pain in left toe(s): Secondary | ICD-10-CM | POA: Diagnosis not present

## 2023-01-31 DIAGNOSIS — B351 Tinea unguium: Secondary | ICD-10-CM | POA: Diagnosis not present

## 2023-01-31 DIAGNOSIS — M79674 Pain in right toe(s): Secondary | ICD-10-CM | POA: Diagnosis not present

## 2023-02-03 DIAGNOSIS — M6281 Muscle weakness (generalized): Secondary | ICD-10-CM | POA: Diagnosis not present

## 2023-02-05 DIAGNOSIS — M6281 Muscle weakness (generalized): Secondary | ICD-10-CM | POA: Diagnosis not present

## 2023-02-06 DIAGNOSIS — N390 Urinary tract infection, site not specified: Secondary | ICD-10-CM | POA: Diagnosis not present

## 2023-02-07 DIAGNOSIS — M6281 Muscle weakness (generalized): Secondary | ICD-10-CM | POA: Diagnosis not present

## 2023-02-10 DIAGNOSIS — M6281 Muscle weakness (generalized): Secondary | ICD-10-CM | POA: Diagnosis not present

## 2023-02-11 DIAGNOSIS — M6281 Muscle weakness (generalized): Secondary | ICD-10-CM | POA: Diagnosis not present

## 2023-02-12 DIAGNOSIS — M6281 Muscle weakness (generalized): Secondary | ICD-10-CM | POA: Diagnosis not present

## 2023-02-13 DIAGNOSIS — M6281 Muscle weakness (generalized): Secondary | ICD-10-CM | POA: Diagnosis not present

## 2023-02-17 DIAGNOSIS — M6281 Muscle weakness (generalized): Secondary | ICD-10-CM | POA: Diagnosis not present

## 2023-02-18 DIAGNOSIS — M6281 Muscle weakness (generalized): Secondary | ICD-10-CM | POA: Diagnosis not present

## 2023-02-18 DIAGNOSIS — F419 Anxiety disorder, unspecified: Secondary | ICD-10-CM | POA: Diagnosis not present

## 2023-02-18 DIAGNOSIS — F02B18 Dementia in other diseases classified elsewhere, moderate, with other behavioral disturbance: Secondary | ICD-10-CM | POA: Diagnosis not present

## 2023-02-18 DIAGNOSIS — F251 Schizoaffective disorder, depressive type: Secondary | ICD-10-CM | POA: Diagnosis not present

## 2023-02-18 DIAGNOSIS — F633 Trichotillomania: Secondary | ICD-10-CM | POA: Diagnosis not present

## 2023-02-18 DIAGNOSIS — F5101 Primary insomnia: Secondary | ICD-10-CM | POA: Diagnosis not present

## 2023-02-19 DIAGNOSIS — M6281 Muscle weakness (generalized): Secondary | ICD-10-CM | POA: Diagnosis not present

## 2023-02-21 DIAGNOSIS — M6281 Muscle weakness (generalized): Secondary | ICD-10-CM | POA: Diagnosis not present

## 2023-02-22 DIAGNOSIS — I10 Essential (primary) hypertension: Secondary | ICD-10-CM | POA: Diagnosis not present

## 2023-02-22 DIAGNOSIS — F209 Schizophrenia, unspecified: Secondary | ICD-10-CM | POA: Diagnosis not present

## 2023-02-24 DIAGNOSIS — M6281 Muscle weakness (generalized): Secondary | ICD-10-CM | POA: Diagnosis not present

## 2023-02-24 DIAGNOSIS — Z5181 Encounter for therapeutic drug level monitoring: Secondary | ICD-10-CM | POA: Diagnosis not present

## 2023-02-24 DIAGNOSIS — F251 Schizoaffective disorder, depressive type: Secondary | ICD-10-CM | POA: Diagnosis not present

## 2023-02-25 DIAGNOSIS — M6281 Muscle weakness (generalized): Secondary | ICD-10-CM | POA: Diagnosis not present

## 2023-02-26 DIAGNOSIS — M6281 Muscle weakness (generalized): Secondary | ICD-10-CM | POA: Diagnosis not present

## 2023-02-27 DIAGNOSIS — M6281 Muscle weakness (generalized): Secondary | ICD-10-CM | POA: Diagnosis not present

## 2023-02-28 ENCOUNTER — Encounter: Payer: Self-pay | Admitting: Gastroenterology

## 2023-03-03 DIAGNOSIS — M6281 Muscle weakness (generalized): Secondary | ICD-10-CM | POA: Diagnosis not present

## 2023-03-04 DIAGNOSIS — M6281 Muscle weakness (generalized): Secondary | ICD-10-CM | POA: Diagnosis not present

## 2023-03-05 DIAGNOSIS — M6281 Muscle weakness (generalized): Secondary | ICD-10-CM | POA: Diagnosis not present

## 2023-03-06 ENCOUNTER — Other Ambulatory Visit: Payer: Self-pay | Admitting: Gastroenterology

## 2023-03-06 DIAGNOSIS — M6281 Muscle weakness (generalized): Secondary | ICD-10-CM | POA: Diagnosis not present

## 2023-03-06 DIAGNOSIS — R2231 Localized swelling, mass and lump, right upper limb: Secondary | ICD-10-CM | POA: Diagnosis not present

## 2023-03-07 NOTE — Telephone Encounter (Signed)
Pt was suppose to follow up after EGD but didn't. Rx refused

## 2023-03-10 ENCOUNTER — Telehealth: Payer: Self-pay | Admitting: *Deleted

## 2023-03-10 DIAGNOSIS — M6281 Muscle weakness (generalized): Secondary | ICD-10-CM | POA: Diagnosis not present

## 2023-03-10 DIAGNOSIS — H25813 Combined forms of age-related cataract, bilateral: Secondary | ICD-10-CM | POA: Diagnosis not present

## 2023-03-10 NOTE — Telephone Encounter (Signed)
Please schedule the pt for a follow up visit.

## 2023-03-10 NOTE — Telephone Encounter (Signed)
[facility] called and states Carafate was denied and it did not give a reason why. They were not sure if pt was to continue taking medication.  It has zero refills.

## 2023-03-10 NOTE — Telephone Encounter (Signed)
Noted    Informed pharmacy

## 2023-03-10 NOTE — Telephone Encounter (Signed)
No need to continue carafate at this time.

## 2023-03-11 DIAGNOSIS — M6281 Muscle weakness (generalized): Secondary | ICD-10-CM | POA: Diagnosis not present

## 2023-03-13 DIAGNOSIS — M6281 Muscle weakness (generalized): Secondary | ICD-10-CM | POA: Diagnosis not present

## 2023-03-18 DIAGNOSIS — M6281 Muscle weakness (generalized): Secondary | ICD-10-CM | POA: Diagnosis not present

## 2023-03-20 DIAGNOSIS — F633 Trichotillomania: Secondary | ICD-10-CM | POA: Diagnosis not present

## 2023-03-20 DIAGNOSIS — F5101 Primary insomnia: Secondary | ICD-10-CM | POA: Diagnosis not present

## 2023-03-20 DIAGNOSIS — M6281 Muscle weakness (generalized): Secondary | ICD-10-CM | POA: Diagnosis not present

## 2023-03-20 DIAGNOSIS — F251 Schizoaffective disorder, depressive type: Secondary | ICD-10-CM | POA: Diagnosis not present

## 2023-03-20 DIAGNOSIS — F02B18 Dementia in other diseases classified elsewhere, moderate, with other behavioral disturbance: Secondary | ICD-10-CM | POA: Diagnosis not present

## 2023-03-20 DIAGNOSIS — F419 Anxiety disorder, unspecified: Secondary | ICD-10-CM | POA: Diagnosis not present

## 2023-03-24 DIAGNOSIS — M6281 Muscle weakness (generalized): Secondary | ICD-10-CM | POA: Diagnosis not present

## 2023-03-25 DIAGNOSIS — I10 Essential (primary) hypertension: Secondary | ICD-10-CM | POA: Diagnosis not present

## 2023-03-25 DIAGNOSIS — F209 Schizophrenia, unspecified: Secondary | ICD-10-CM | POA: Diagnosis not present

## 2023-03-27 DIAGNOSIS — M6281 Muscle weakness (generalized): Secondary | ICD-10-CM | POA: Diagnosis not present

## 2023-03-31 NOTE — Progress Notes (Signed)
Referring Provider: Benetta Spar* Primary Care Physician:  Benetta Spar, MD Primary GI Physician: Dr. Marletta Lor  Chief Complaint  Patient presents with   Diarrhea    Diarrhea all the time.    HPI:   Stephanie Vincent is a 67 y.o. female with history of depression, schizophrenia, seizures, HTN, GERD, H. pylori gastritis, anemia, colon polyps, presenting today for follow-up of epigastric abdominal pain s/p EGD.  Patient was last seen in our office 12/19/2022.  She reported a few months of postprandial, sharp, epigastric abdominal pain.  GERD was well-controlled on pantoprazole daily.  She had been taking meloxicam daily.  She had recently been evaluated in the emergency room on 4/22.  Laboratory evaluation was unrevealing.  CT angio chest, abdomen, pelvis with no acute findings.  Noted improvement in symptoms with GI cocktail and since that time had been taking Carafate which provided some improvement.  She was scheduled for an EGD for further evaluation, pantoprazole increased to twice daily, and continued on Carafate.  Also advised to stop meloxicam.  EGD 01/14/2023: Nonobstructing and mild Schatzki's ring, gastritis biopsied, normal examined duodenum.  Recommended PPI twice daily and follow-up in 3 months.  Pathology showed focal mild chronic inactive gastritis, negative for H. pylori.  Today: Reports prior epigastric pain has resolved. She has noticed some intermittent loose stools. Reports this started after starting pantoprazole.  No watery stools or nocturnal stools.  No BRBPR or melena.  No other GI concerns.   Colonoscopy 06/02/2019: Redundant colon, 5 mm polyp in the descending colon, otherwise normal exam. Pathology with tubular adenoma. Recommended repeat exam in 5 years.   Past Medical History:  Diagnosis Date   H. pylori infection 06/01/2019   Treated with amoxicillin, Biaxin, Protonix   HTN (hypertension)    Manic depression (HCC)    Schizophrenia  (HCC)    Seizures (HCC)     Past Surgical History:  Procedure Laterality Date   BIOPSY  06/01/2019   Procedure: BIOPSY;  Surgeon: West Bali, MD;  Location: [facility];  Service: Endoscopy;;  gastric   BIOPSY  01/14/2023   Procedure: BIOPSY;  Surgeon: Lanelle Bal, DO;  Location: [facility];  Service: Endoscopy;;   COLONOSCOPY  02/2009   Dr. Darrick Penna: slightly torturous colon otherwise normal    COLONOSCOPY N/A 06/02/2019   Procedure: COLONOSCOPY;  Surgeon: Corbin Ade, MD; redundant colon, 5 mm polyp in the descending colon, otherwise normal exam.  Path with tubular adenoma.  Recommended repeat in 5 years.   COLONOSCOPY WITH PROPOFOL N/A 06/01/2019   Procedure: COLONOSCOPY WITH PROPOFOL;  Surgeon: West Bali, MD; incomplete due to poor prep   ESOPHAGOGASTRODUODENOSCOPY (EGD) WITH PROPOFOL N/A 06/01/2019   Procedure: ESOPHAGOGASTRODUODENOSCOPY (EGD) WITH PROPOFOL;  Surgeon: West Bali, MD;  Normal esophagus, gastritis s/p biopsy, normal duodenum.  Pathology positive for H. Pylori.  Treated with amoxicillin, Biaxin, and Protonix.    ESOPHAGOGASTRODUODENOSCOPY (EGD) WITH PROPOFOL N/A 01/14/2023   Procedure: ESOPHAGOGASTRODUODENOSCOPY (EGD) WITH PROPOFOL;  Surgeon: Lanelle Bal, DO;  Location: [facility];  Service: Endoscopy;  Laterality: N/A;  1:30pm, asa 3   POLYPECTOMY  06/02/2019   Procedure: POLYPECTOMY;  Surgeon: Corbin Ade, MD;  Location: [facility];  Service: Endoscopy;;  colon    Current Outpatient Medications  Medication Sig Dispense Refill   acetaminophen (TYLENOL) 325 MG tablet Take 650 mg by mouth every 4 (four) hours as needed (fever/mild pain.).     amLODipine (NORVASC)  5 MG tablet Take 5 mg by mouth in the morning. (0800)     ARIPiprazole (ABILIFY) 5 MG tablet Take 5 mg by mouth daily.     betamethasone dipropionate 0.05 % cream Apply 1 Application topically 2 (two) times daily as needed (rash).     Cholecalciferol (VITAMIN D3) 50  MCG (2000 UT) TABS Take 2,000 Units by mouth in the morning. (0800)     divalproex (DEPAKOTE) 125 MG DR tablet Take 375 mg by mouth 3 (three) times daily. (0800, 1400 & 2000)     donepezil (ARICEPT) 10 MG tablet Take 10 mg by mouth daily at 8 pm.     loperamide (IMODIUM) 2 MG capsule Take 2 mg by mouth as needed for diarrhea or loose stools. Take 2 capsules (4 mg) by mouth with 1st loose stool. Then take 1 capsule (2 mg) by mouth after each loose stool as needed do not exceed 5 doses in 24hrs     meloxicam (MOBIC) 15 MG tablet Take 15 mg by mouth in the morning. (0800)     metoprolol succinate (TOPROL-XL) 25 MG 24 hr tablet Take 1 tablet (25 mg total) by mouth daily. 30 tablet 1   montelukast (SINGULAIR) 10 MG tablet Take 10 mg by mouth daily at 8 pm. (2000)     OLANZapine zydis (ZYPREXA) 10 MG disintegrating tablet Take 5 mg by mouth 2 (two) times daily. (0800 & 2000)     pantoprazole (PROTONIX) 40 MG tablet Take 1 tablet (40 mg total) by mouth daily before breakfast. 30 tablet 3   PHENobarbital (LUMINAL) 32.4 MG tablet Take 32.4 mg by mouth at bedtime. (2000)     sucralfate (CARAFATE) 1 GM/10ML suspension TAKE 10 ML (1 GM TOTAL) BY MOUTH FOUR TIMES DAILY WITH MEALS & AT BEDTIME. 1200 mL 0   traZODone (DESYREL) 150 MG tablet Take 150 mg by mouth at bedtime. (2000)     Wheat Dextrin (BENEFIBER DRINK MIX) PACK Take 1 packet by mouth daily. 90 each 3   No current facility-administered medications for this visit.    Allergies as of 04/02/2023   (No Known Allergies)    Family History  Problem Relation Age of Onset   Colon cancer Neg Hx    Colon polyps Neg Hx     Social History   Socioeconomic History   Marital status: Single    Spouse name: Not on file   Number of children: Not on file   Years of education: Not on file   Highest education level: Not on file  Occupational History   Not on file  Tobacco Use   Smoking status: Never   Smokeless tobacco: Never  Vaping Use   Vaping  status: Never Used  Substance and Sexual Activity   Alcohol use: No   Drug use: No   Sexual activity: Not Currently  Other Topics Concern   Not on file  Social History Narrative   Not on file   Social Determinants of Health   Financial Resource Strain: Patient Declined (03/06/2022)   Received from [facility], [facility]   Overall Financial Resource Strain (CARDIA)    Difficulty of Paying Living Expenses: Patient declined  Food Insecurity: No Food Insecurity (05/31/2019)   Hunger Vital Sign    Worried About Running Out of Food in the Last Year: Never true    Ran Out of Food in the Last Year: Never true  Transportation Needs: No Transportation Needs (05/31/2019)   PRAPARE - Transportation  Lack of Transportation (Medical): No    Lack of Transportation (Non-Medical): No  Physical Activity: Inactive (05/31/2019)   Exercise Vital Sign    Days of Exercise per Week: 0 days    Minutes of Exercise per Session: 0 min  Stress: Patient Declined (03/06/2022)   Received from [facility], [facility] of Occupational [facility] - Occupational Stress Questionnaire    Feeling of Stress : Patient declined  Social Connections: Unknown (03/04/2022)   Received from [facility], [facility]   Social Network    Social Network: Not on file    Review of Systems: Gen: Denies fever, chills, cold flulike symptoms, presyncope, syncope. CV: Denies chest pain, palpitations. Resp: Denies dyspnea, cough. GI: See HPI Heme: See HPI  Physical Exam: BP 136/89 (BP Location: Right Arm, Patient Position: Sitting, Cuff Size: Normal)   Pulse 78   Temp 97.9 F (36.6 C) (Temporal)   Wt 199 lb (90.3 kg)   SpO2 96%   BMI (P) 32.12 kg/m  General:   Alert and oriented. No distress noted. Pleasant and cooperative.  Head:  Normocephalic and atraumatic. Eyes:  Conjuctiva clear without scleral icterus. Heart:  S1, S2 present without murmurs appreciated. Lungs:  Clear to auscultation  bilaterally. No wheezes, rales, or rhonchi. No distress.  Abdomen:  +BS, soft, non-tender and non-distended. No rebound or guarding. No HSM or masses noted. Msk:  Symmetrical without gross deformities. Normal posture. Extremities:  Without edema. Neurologic:  Alert and  oriented x4 Psych:  Normal mood and affect.    Assessment:  67 y.o. female with history of depression, schizophrenia, seizures, HTN, GERD, H. pylori gastritis, anemia, colon polyps, presenting today for follow-up of epigastric abdominal pain.   Epigastric abdominal pain:  Resolved. Possibly secondary to gastritis. EGD 01/14/23 with nonobstructing and mild Schatzki's ring, gastritis biopsied, normal examined duodenum. Pathology showed focal mild chronic inactive gastritis, negative for H. pylori. She has been on PPI BID since May. As symptoms have resolved, we will decrease PPI to once daily and monitor for recurrent symptoms.   Loose stools:  Intermittent since starting pantoprazole. No alarm symptoms. Colonoscopy up to date. Due for surveillance next year. We are decreasing pantoprazole to 40 mg once daily, starting benefiber, and will monitor for any worsening symptoms.    Plan:  Decrease pantoprazole 40 mg daily. Monitor for recurrent abdominal pain.  Start Benefiber 3 teaspoons daily.  Monitor for worsening loose stools.  Follow-up in 3 months or sooner if needed.    Ermalinda Memos, PA-C [facility] 04/02/2023

## 2023-04-01 DIAGNOSIS — M6281 Muscle weakness (generalized): Secondary | ICD-10-CM | POA: Diagnosis not present

## 2023-04-02 ENCOUNTER — Ambulatory Visit (INDEPENDENT_AMBULATORY_CARE_PROVIDER_SITE_OTHER): Payer: Medicare Other | Admitting: Gastroenterology

## 2023-04-02 ENCOUNTER — Encounter: Payer: Self-pay | Admitting: Gastroenterology

## 2023-04-02 ENCOUNTER — Telehealth: Payer: Self-pay | Admitting: *Deleted

## 2023-04-02 VITALS — BP 136/89 | HR 78 | Temp 97.9°F | Wt 199.0 lb

## 2023-04-02 DIAGNOSIS — R195 Other fecal abnormalities: Secondary | ICD-10-CM

## 2023-04-02 DIAGNOSIS — R1013 Epigastric pain: Secondary | ICD-10-CM

## 2023-04-02 MED ORDER — PANTOPRAZOLE SODIUM 40 MG PO TBEC
40.0000 mg | DELAYED_RELEASE_TABLET | Freq: Every day | ORAL | 3 refills | Status: DC
Start: 2023-04-02 — End: 2023-07-03

## 2023-04-02 NOTE — Patient Instructions (Signed)
Decrease pantoprazole to 40 mg once daily 30 minutes before breakfast.  Monitor for recurrent upper abdominal pain.  Your loose stools may be secondary to pantoprazole.  Will see if this improves with decreasing the dose to once daily.  I would also like for you to start Benefiber 3 teaspoons daily to see if this will help form up your stools.  If your loose stools worsen, please let me know.  We will follow-up with you in 3 months or sooner if needed.  It was great to see you again today!  Ermalinda Memos, PA-C [facility]

## 2023-04-02 NOTE — Telephone Encounter (Signed)
[facility] called and states they need a prescription for Benefiber for the pt.

## 2023-04-03 ENCOUNTER — Other Ambulatory Visit: Payer: Self-pay | Admitting: Gastroenterology

## 2023-04-03 DIAGNOSIS — M6281 Muscle weakness (generalized): Secondary | ICD-10-CM | POA: Diagnosis not present

## 2023-04-03 DIAGNOSIS — R195 Other fecal abnormalities: Secondary | ICD-10-CM

## 2023-04-03 MED ORDER — BENEFIBER DRINK MIX PO PACK
1.0000 | PACK | Freq: Every day | ORAL | 3 refills | Status: DC
Start: 2023-04-03 — End: 2023-07-03

## 2023-04-03 NOTE — Telephone Encounter (Signed)
Rx sent 

## 2023-04-04 ENCOUNTER — Encounter: Payer: Self-pay | Admitting: Gastroenterology

## 2023-04-07 DIAGNOSIS — M6281 Muscle weakness (generalized): Secondary | ICD-10-CM | POA: Diagnosis not present

## 2023-04-09 DIAGNOSIS — M6281 Muscle weakness (generalized): Secondary | ICD-10-CM | POA: Diagnosis not present

## 2023-04-10 DIAGNOSIS — M79674 Pain in right toe(s): Secondary | ICD-10-CM | POA: Diagnosis not present

## 2023-04-10 DIAGNOSIS — M79675 Pain in left toe(s): Secondary | ICD-10-CM | POA: Diagnosis not present

## 2023-04-10 DIAGNOSIS — B351 Tinea unguium: Secondary | ICD-10-CM | POA: Diagnosis not present

## 2023-04-14 DIAGNOSIS — M6281 Muscle weakness (generalized): Secondary | ICD-10-CM | POA: Diagnosis not present

## 2023-04-14 DIAGNOSIS — R1013 Epigastric pain: Secondary | ICD-10-CM | POA: Diagnosis not present

## 2023-04-14 DIAGNOSIS — M25511 Pain in right shoulder: Secondary | ICD-10-CM | POA: Diagnosis not present

## 2023-04-14 DIAGNOSIS — F209 Schizophrenia, unspecified: Secondary | ICD-10-CM | POA: Diagnosis not present

## 2023-04-16 DIAGNOSIS — M6281 Muscle weakness (generalized): Secondary | ICD-10-CM | POA: Diagnosis not present

## 2023-04-17 DIAGNOSIS — F633 Trichotillomania: Secondary | ICD-10-CM | POA: Diagnosis not present

## 2023-04-17 DIAGNOSIS — F5101 Primary insomnia: Secondary | ICD-10-CM | POA: Diagnosis not present

## 2023-04-17 DIAGNOSIS — F02B18 Dementia in other diseases classified elsewhere, moderate, with other behavioral disturbance: Secondary | ICD-10-CM | POA: Diagnosis not present

## 2023-04-17 DIAGNOSIS — F419 Anxiety disorder, unspecified: Secondary | ICD-10-CM | POA: Diagnosis not present

## 2023-04-17 DIAGNOSIS — F251 Schizoaffective disorder, depressive type: Secondary | ICD-10-CM | POA: Diagnosis not present

## 2023-04-22 DIAGNOSIS — M6281 Muscle weakness (generalized): Secondary | ICD-10-CM | POA: Diagnosis not present

## 2023-04-23 DIAGNOSIS — M6281 Muscle weakness (generalized): Secondary | ICD-10-CM | POA: Diagnosis not present

## 2023-04-28 DIAGNOSIS — M6281 Muscle weakness (generalized): Secondary | ICD-10-CM | POA: Diagnosis not present

## 2023-04-30 DIAGNOSIS — M6281 Muscle weakness (generalized): Secondary | ICD-10-CM | POA: Diagnosis not present

## 2023-05-05 DIAGNOSIS — M6281 Muscle weakness (generalized): Secondary | ICD-10-CM | POA: Diagnosis not present

## 2023-05-07 DIAGNOSIS — M6281 Muscle weakness (generalized): Secondary | ICD-10-CM | POA: Diagnosis not present

## 2023-05-12 DIAGNOSIS — M6281 Muscle weakness (generalized): Secondary | ICD-10-CM | POA: Diagnosis not present

## 2023-05-13 DIAGNOSIS — M6281 Muscle weakness (generalized): Secondary | ICD-10-CM | POA: Diagnosis not present

## 2023-05-15 DIAGNOSIS — F633 Trichotillomania: Secondary | ICD-10-CM | POA: Diagnosis not present

## 2023-05-15 DIAGNOSIS — F5101 Primary insomnia: Secondary | ICD-10-CM | POA: Diagnosis not present

## 2023-05-15 DIAGNOSIS — F02B18 Dementia in other diseases classified elsewhere, moderate, with other behavioral disturbance: Secondary | ICD-10-CM | POA: Diagnosis not present

## 2023-05-15 DIAGNOSIS — F209 Schizophrenia, unspecified: Secondary | ICD-10-CM | POA: Diagnosis not present

## 2023-05-15 DIAGNOSIS — I10 Essential (primary) hypertension: Secondary | ICD-10-CM | POA: Diagnosis not present

## 2023-05-15 DIAGNOSIS — F251 Schizoaffective disorder, depressive type: Secondary | ICD-10-CM | POA: Diagnosis not present

## 2023-05-15 DIAGNOSIS — F419 Anxiety disorder, unspecified: Secondary | ICD-10-CM | POA: Diagnosis not present

## 2023-05-19 DIAGNOSIS — M6281 Muscle weakness (generalized): Secondary | ICD-10-CM | POA: Diagnosis not present

## 2023-05-21 DIAGNOSIS — Z23 Encounter for immunization: Secondary | ICD-10-CM | POA: Diagnosis not present

## 2023-05-21 DIAGNOSIS — M6281 Muscle weakness (generalized): Secondary | ICD-10-CM | POA: Diagnosis not present

## 2023-05-25 ENCOUNTER — Emergency Department (HOSPITAL_COMMUNITY): Payer: Medicare Other

## 2023-05-25 ENCOUNTER — Emergency Department (HOSPITAL_COMMUNITY)
Admission: EM | Admit: 2023-05-25 | Discharge: 2023-05-25 | Disposition: A | Discharge status: Home / Self Care | Payer: Medicare Other

## 2023-05-25 ENCOUNTER — Other Ambulatory Visit: Payer: Self-pay

## 2023-05-25 DIAGNOSIS — R0789 Other chest pain: Secondary | ICD-10-CM | POA: Diagnosis not present

## 2023-05-25 DIAGNOSIS — R531 Weakness: Secondary | ICD-10-CM | POA: Diagnosis not present

## 2023-05-25 DIAGNOSIS — R071 Chest pain on breathing: Secondary | ICD-10-CM | POA: Diagnosis not present

## 2023-05-25 DIAGNOSIS — I959 Hypotension, unspecified: Secondary | ICD-10-CM | POA: Diagnosis not present

## 2023-05-25 DIAGNOSIS — U071 COVID-19: Secondary | ICD-10-CM | POA: Diagnosis not present

## 2023-05-25 DIAGNOSIS — R0902 Hypoxemia: Secondary | ICD-10-CM | POA: Diagnosis not present

## 2023-05-25 DIAGNOSIS — R059 Cough, unspecified: Secondary | ICD-10-CM | POA: Diagnosis not present

## 2023-05-25 DIAGNOSIS — R062 Wheezing: Secondary | ICD-10-CM | POA: Diagnosis not present

## 2023-05-25 LAB — CBC
HCT: 37.4 % (ref 36.0–46.0)
Hemoglobin: 11.9 g/dL — ABNORMAL LOW (ref 12.0–15.0)
MCH: 29.2 pg (ref 26.0–34.0)
MCHC: 31.8 g/dL (ref 30.0–36.0)
MCV: 91.9 fL (ref 80.0–100.0)
Platelets: 168 10*3/uL (ref 150–400)
RBC: 4.07 MIL/uL (ref 3.87–5.11)
RDW: 12.8 % (ref 11.5–15.5)
WBC: 7.6 10*3/uL (ref 4.0–10.5)
nRBC: 0 % (ref 0.0–0.2)

## 2023-05-25 LAB — BASIC METABOLIC PANEL
Anion gap: 12 (ref 5–15)
BUN: 13 mg/dL (ref 8–23)
CO2: 24 mmol/L (ref 22–32)
Calcium: 9 mg/dL (ref 8.9–10.3)
Chloride: 99 mmol/L (ref 98–111)
Creatinine, Ser: 0.59 mg/dL (ref 0.44–1.00)
GFR, Estimated: >60 mL/min (ref >60)
Glucose, Bld: 94 mg/dL (ref 70–99)
Potassium: 3.6 mmol/L (ref 3.5–5.1)
Sodium: 135 mmol/L (ref 135–145)

## 2023-05-25 LAB — TROPONIN I (HIGH SENSITIVITY)
Troponin I (High Sensitivity): 5 ng/L (ref <18)
Troponin I (High Sensitivity): 5 ng/L (ref <18)

## 2023-05-25 LAB — SARS CORONAVIRUS 2 BY RT PCR: SARS Coronavirus 2 by RT PCR: POSITIVE — AB

## 2023-05-25 NOTE — Discharge Instructions (Addendum)
You have COVID.  May take over-the-counter Tylenol alternating with Motrin for generalized malaise and fevers.  Please return immediately if develop worsening chest pain, shortness of breath, lightheadedness, passout or develop any new or worsening symptoms that are concerning to you.

## 2023-05-25 NOTE — ED Triage Notes (Signed)
Pt BIB EMS from [facility] with complaints of cough and weakness that started yesterday. Per EMS COVID is going around the facility. Pt is normally not on 0@ pt was 90% on room air and placed on 3 liters. Sats improved to 95%

## 2023-05-25 NOTE — ED Provider Notes (Signed)
[place] EMERGENCY DEPARTMENT AT [facility] Provider Note   CSN: [id number] Arrival date & time: 05/25/23  1134     History  Chief Complaint  Patient presents with   Cough   Weakness   Chest Pain    Stephanie Vincent is a 67 y.o. female.   67 year old female presenting emergency department for cough and some generalized weakness that started yesterday.  Patient is poor historian, was having some chest tightness earlier, none currently.  Was complaining of shortness of breath.  No nausea no vomiting.   Cough Associated symptoms: chest pain   Weakness Associated symptoms: chest pain and cough   Chest Pain Associated symptoms: cough and weakness        Home Medications Prior to Admission medications   Medication Sig Start Date End Date Taking? Authorizing Provider  acetaminophen (TYLENOL) 325 MG tablet Take 650 mg by mouth every 4 (four) hours as needed (fever/mild pain.).   Yes [provider]  amLODipine (NORVASC) 5 MG tablet Take 5 mg by mouth in the morning. (0800)   Yes [provider]  ARIPiprazole (ABILIFY) 5 MG tablet Take 5 mg by mouth daily.   Yes [provider]  betamethasone dipropionate 0.05 % cream Apply 1 Application topically 2 (two) times daily as needed (rash).   Yes [provider]  Cholecalciferol (VITAMIN D3) 50 MCG (2000 UT) TABS Take 2,000 Units by mouth in the morning. (0800) 06/14/22  Yes [provider]  diclofenac Sodium (VOLTAREN) 1 % GEL Apply 2 g topically 4 (four) times daily. 03/26/23  Yes [provider]  divalproex (DEPAKOTE) 125 MG DR tablet Take 375 mg by mouth 3 (three) times daily. (0800, 1400 & 2000)   Yes [provider]  donepezil (ARICEPT) 10 MG tablet Take 10 mg by mouth daily at 8 pm. 12/15/15  Yes [provider]  metoprolol succinate (TOPROL-XL) 25 MG 24 hr tablet Take 1 tablet (25 mg total) by mouth daily. 10/16/20  Yes Mesner, Barbara Cower, MD   montelukast (SINGULAIR) 10 MG tablet Take 10 mg by mouth daily at 8 pm. (2000)   Yes [provider]  OLANZapine zydis (ZYPREXA) 10 MG disintegrating tablet Take 5 mg by mouth 2 (two) times daily. (0800 & 2000) 11/19/22  Yes [provider]  pantoprazole (PROTONIX) 40 MG tablet Take 1 tablet (40 mg total) by mouth daily before breakfast. 04/02/23  Yes Clearance Coots, Kristen S, PA-C  PHENobarbital (LUMINAL) 32.4 MG tablet Take 32.4 mg by mouth at bedtime. (2000)   Yes [provider]  sucralfate (CARAFATE) 1 GM/10ML suspension TAKE 10 ML (1 GM TOTAL) BY MOUTH FOUR TIMES DAILY WITH MEALS & AT BEDTIME. 12/24/22  Yes Harper, Kristen S, PA-C  traZODone (DESYREL) 150 MG tablet Take 150 mg by mouth at bedtime. (2000)   Yes [provider]  Wheat Dextrin (BENEFIBER DRINK MIX) PACK Take 1 packet by mouth daily. 04/03/23  Yes Letta Median, PA-C  loperamide (IMODIUM) 2 MG capsule Take 2 mg by mouth as needed for diarrhea or loose stools. Take 2 capsules (4 mg) by mouth with 1st loose stool. Then take 1 capsule (2 mg) by mouth after each loose stool as needed do not exceed 5 doses in 24hrs    [provider]  LORazepam (ATIVAN) 0.5 MG tablet Take 0.5 mg by mouth every 6 (six) hours as needed for anxiety. 04/29/23   [provider]  lisinopril (PRINIVIL,ZESTRIL) 10 MG tablet Take 10 mg by  mouth daily.  12/15/15 10/16/20  [provider]      Allergies    Patient has no known allergies.    Review of Systems   Review of Systems  Respiratory:  Positive for cough.   Cardiovascular:  Positive for chest pain.  Neurological:  Positive for weakness.    Physical Exam Updated Vital Signs BP (!) 144/85 (BP Location: Left Arm)   Pulse 82   Temp 99.2 F (37.3 C) (Oral)   Resp (!) 22   Ht 5\' 2"  (1.575 m)   Wt 90 kg   SpO2 94%   BMI 36.29 kg/m  Physical Exam Vitals and nursing note reviewed.  Constitutional:      General: She is not in acute distress.     Appearance: She is obese. She is not toxic-appearing.  HENT:     Head: Normocephalic.  Cardiovascular:     Rate and Rhythm: Normal rate and regular rhythm.     Pulses:          Radial pulses are 2+ on the right side and 2+ on the left side.     Heart sounds: Normal heart sounds.  Pulmonary:     Effort: Pulmonary effort is normal. No respiratory distress.     Breath sounds: No wheezing, rhonchi or rales.  Abdominal:     Palpations: Abdomen is soft.     Tenderness: There is no abdominal tenderness.  Musculoskeletal:     Right lower leg: No edema.     Left lower leg: No edema.  Skin:    General: Skin is warm and dry.     Capillary Refill: Capillary refill takes less than 2 seconds.  Neurological:     Mental Status: She is alert and oriented to person, place, and time.  Psychiatric:        Mood and Affect: Mood normal.        Behavior: Behavior normal.     ED Results / Procedures / Treatments   Labs (all labs ordered are listed, but only abnormal results are displayed) Labs Reviewed  SARS CORONAVIRUS 2 BY RT PCR - Abnormal; Notable for the following components:      Result Value   SARS Coronavirus 2 by RT PCR POSITIVE (*)    All other components within normal limits  CBC - Abnormal; Notable for the following components:   Hemoglobin 11.9 (*)    All other components within normal limits  BASIC METABOLIC PANEL  TROPONIN I (HIGH SENSITIVITY)  TROPONIN I (HIGH SENSITIVITY)    EKG EKG Interpretation Date/Time:  Sunday May 25 2023 12:45:30 EDT Ventricular Rate:  81 PR Interval:  150 QRS Duration:  78 QT Interval:  381 QTC Calculation: 443 R Axis:   53  Text Interpretation: Sinus rhythm Borderline T wave abnormalities Confirmed by Estanislado Pandy [phone]) on 05/25/2023 1:48:30 PM  Radiology DG Chest Port 1 View  Result Date: 05/25/2023 CLINICAL DATA:  Cough and wheezing.  Positive for COVID EXAM: PORTABLE CHEST 1 VIEW COMPARISON:  12/09/2022 x-ray and CT angiogram  FINDINGS: Underinflation. Normal cardiopericardial silhouette when adjusting for technique. No consolidation, pneumothorax or effusion. No edema. Overlapping cardiac leads. Degenerative changes of the spine and shoulders. IMPRESSION: Underinflation.  No acute cardiopulmonary disease. Electronically Signed   By: Karen Kays M.D.   On: 05/25/2023 14:02    Procedures Procedures    Medications Ordered in ED Medications - No data to display  ED Course/ Medical Decision Making/ A&P Clinical Course as of  05/25/23 1527  Sun May 25, 2023  1415 Patient has not been hypoxic since arrival to the emergency department.  Ambulated and maintain her oxygen saturation.  COVID-positive.  X-ray reassuring.  No leukocytosis on CBC.  No metabolic derangements.  Was complaining of some chest discomfort.  Troponin negative.  EKG without ST segment changes to indicate ischemia. [TY]  1512  repeat troponin negative.  No leukocytosis to suggest systemic infection.  Basic metabolic panel without signs of dehydration.  She has clear lungs, soft benign abdomen.  Negative troponin x 2.  Ambulated in the emergency department maintaining oxygen saturation.  Stable for discharge at this time. [TY]    Clinical Course User Index [TY] Coral Spikes, DO                                 Medical Decision Making Is a 67 year old female presenting emergency department for cough, some chest tightness.  She afebrile nontachycardic maintaining oxygen saturation on room air.  Was reported to be borderline hypoxic with EMS to 90%.  However she has been on room air here in the emergency department and maintained her oxygen saturation.  Chest x-ray without acute pneumonia or pneumothorax.  She is COVID-positive.  EKG without ST segment changes to get ischemia on my independent interpretation.  Troponin negative.  ACS less likely.  Otherwise low risk for PE based on Wells criteria.  Waiting repeat troponin given her complaint of chest  pain/discomfort. See ED course for final MDM/Disposition.   Amount and/or Complexity of Data Reviewed Labs: ordered. Radiology: ordered.           Final Clinical Impression(s) / ED Diagnoses Final diagnoses:  COVID-19    Rx / DC Orders ED Discharge Orders     None         Coral Spikes, DO 05/25/23 1527

## 2023-05-25 NOTE — ED Notes (Signed)
Director of [facility] called and wanted to know why pt was not given paxlovid for covid. CN spoke with EDP and CN called Director back to explain. Director stated she understood .

## 2023-05-25 NOTE — ED Notes (Addendum)
Angel from [employer] called to inquire on her status.

## 2023-05-26 DIAGNOSIS — M6281 Muscle weakness (generalized): Secondary | ICD-10-CM | POA: Diagnosis not present

## 2023-05-28 DIAGNOSIS — M6281 Muscle weakness (generalized): Secondary | ICD-10-CM | POA: Diagnosis not present

## 2023-05-29 DIAGNOSIS — I10 Essential (primary) hypertension: Secondary | ICD-10-CM | POA: Diagnosis not present

## 2023-05-29 DIAGNOSIS — U099 Post covid-19 condition, unspecified: Secondary | ICD-10-CM | POA: Diagnosis not present

## 2023-05-29 DIAGNOSIS — J209 Acute bronchitis, unspecified: Secondary | ICD-10-CM | POA: Diagnosis not present

## 2023-06-02 DIAGNOSIS — M6281 Muscle weakness (generalized): Secondary | ICD-10-CM | POA: Diagnosis not present

## 2023-06-04 DIAGNOSIS — M6281 Muscle weakness (generalized): Secondary | ICD-10-CM | POA: Diagnosis not present

## 2023-06-09 DIAGNOSIS — M6281 Muscle weakness (generalized): Secondary | ICD-10-CM | POA: Diagnosis not present

## 2023-06-11 DIAGNOSIS — R059 Cough, unspecified: Secondary | ICD-10-CM | POA: Diagnosis not present

## 2023-06-11 DIAGNOSIS — I10 Essential (primary) hypertension: Secondary | ICD-10-CM | POA: Diagnosis not present

## 2023-06-11 DIAGNOSIS — J410 Simple chronic bronchitis: Secondary | ICD-10-CM | POA: Diagnosis not present

## 2023-06-11 DIAGNOSIS — R7303 Prediabetes: Secondary | ICD-10-CM | POA: Diagnosis not present

## 2023-06-11 DIAGNOSIS — N390 Urinary tract infection, site not specified: Secondary | ICD-10-CM | POA: Diagnosis not present

## 2023-06-11 DIAGNOSIS — M6281 Muscle weakness (generalized): Secondary | ICD-10-CM | POA: Diagnosis not present

## 2023-06-17 DIAGNOSIS — M6281 Muscle weakness (generalized): Secondary | ICD-10-CM | POA: Diagnosis not present

## 2023-06-18 DIAGNOSIS — M6281 Muscle weakness (generalized): Secondary | ICD-10-CM | POA: Diagnosis not present

## 2023-06-19 DIAGNOSIS — F02B18 Dementia in other diseases classified elsewhere, moderate, with other behavioral disturbance: Secondary | ICD-10-CM | POA: Diagnosis not present

## 2023-06-19 DIAGNOSIS — F419 Anxiety disorder, unspecified: Secondary | ICD-10-CM | POA: Diagnosis not present

## 2023-06-19 DIAGNOSIS — F5101 Primary insomnia: Secondary | ICD-10-CM | POA: Diagnosis not present

## 2023-06-19 DIAGNOSIS — F251 Schizoaffective disorder, depressive type: Secondary | ICD-10-CM | POA: Diagnosis not present

## 2023-06-19 DIAGNOSIS — F633 Trichotillomania: Secondary | ICD-10-CM | POA: Diagnosis not present

## 2023-06-24 DIAGNOSIS — M6281 Muscle weakness (generalized): Secondary | ICD-10-CM | POA: Diagnosis not present

## 2023-06-25 DIAGNOSIS — M6281 Muscle weakness (generalized): Secondary | ICD-10-CM | POA: Diagnosis not present

## 2023-06-29 DIAGNOSIS — F209 Schizophrenia, unspecified: Secondary | ICD-10-CM | POA: Diagnosis not present

## 2023-06-29 DIAGNOSIS — I10 Essential (primary) hypertension: Secondary | ICD-10-CM | POA: Diagnosis not present

## 2023-06-30 NOTE — Progress Notes (Unsigned)
Referring Provider: Benetta Spar* Primary Care Physician:  Benetta Spar, MD Primary GI Physician: Dr. Marletta Lor  No chief complaint on file.   HPI:   Stephanie Vincent is a 67 y.o. female with history of depression, schizophrenia, seizures, HTN, GERD, H. pylori gastritis, anemia, colon polyps, presenting today for follow-up on intermittent loose stools.   EGD completed in May of this year for epigastric abdominal pain in the setting of daily meloxicam.  EGD showed nonobstructing and mild Schatzki's ring, gastritis biopsied, normal examined duodenum.  Recommended PPI twice daily.  Pathology showed a focal mild chronic inactive gastritis, negative for H. pylori.  At her last office visit in August 2024, epigastric pain had resolved with PPI twice daily.  Recommended decreasing PPI to once daily.  She did note some intermittent loose stool since starting pantoprazole.  She had no alarm symptoms and her colonoscopy was up to date, due for surveillance next year.  Recommended decreasing pantoprazole to 40 mg once daily, starting Benefiber, and would monitor for worsening symptoms.  Recommended 56-month follow-up.  Today:    Past Medical History:  Diagnosis Date   H. pylori infection 06/01/2019   Treated with amoxicillin, Biaxin, Protonix   HTN (hypertension)    Manic depression (HCC)    Schizophrenia (HCC)    Seizures (HCC)     Past Surgical History:  Procedure Laterality Date   BIOPSY  06/01/2019   Procedure: BIOPSY;  Surgeon: West Bali, MD;  [facility];  Service: Endoscopy;;  gastric   BIOPSY  01/14/2023   Procedure: BIOPSY;  Surgeon: Lanelle Bal, DO;  [facility];  Service: Endoscopy;;   COLONOSCOPY  02/2009   Dr. Darrick Penna: slightly torturous colon otherwise normal    COLONOSCOPY N/A 06/02/2019   Procedure: COLONOSCOPY;  Surgeon: Corbin Ade, MD; redundant colon, 5 mm polyp in the descending colon, otherwise normal exam.   Path with tubular adenoma.  Recommended repeat in 5 years.   COLONOSCOPY WITH PROPOFOL N/A 06/01/2019   Procedure: COLONOSCOPY WITH PROPOFOL;  Surgeon: West Bali, MD; incomplete due to poor prep   ESOPHAGOGASTRODUODENOSCOPY (EGD) WITH PROPOFOL N/A 06/01/2019   Procedure: ESOPHAGOGASTRODUODENOSCOPY (EGD) WITH PROPOFOL;  Surgeon: West Bali, MD;  Normal esophagus, gastritis s/p biopsy, normal duodenum.  Pathology positive for H. Pylori.  Treated with amoxicillin, Biaxin, and Protonix.    ESOPHAGOGASTRODUODENOSCOPY (EGD) WITH PROPOFOL N/A 01/14/2023   Procedure: ESOPHAGOGASTRODUODENOSCOPY (EGD) WITH PROPOFOL;  Surgeon: Lanelle Bal, DO;  [facility];  Service: Endoscopy;  Laterality: N/A;  1:30pm, asa 3   POLYPECTOMY  06/02/2019   Procedure: POLYPECTOMY;  Surgeon: Corbin Ade, MD;  [facility];  Service: Endoscopy;;  colon    Current Outpatient Medications  Medication Sig Dispense Refill   acetaminophen (TYLENOL) 325 MG tablet Take 650 mg by mouth every 4 (four) hours as needed (fever/mild pain.).     amLODipine (NORVASC) 5 MG tablet Take 5 mg by mouth in the morning. (0800)     ARIPiprazole (ABILIFY) 5 MG tablet Take 5 mg by mouth daily.     betamethasone dipropionate 0.05 % cream Apply 1 Application topically 2 (two) times daily as needed (rash).     Cholecalciferol (VITAMIN D3) 50 MCG (2000 UT) TABS Take 2,000 Units by mouth in the morning. (0800)     diclofenac Sodium (VOLTAREN) 1 % GEL Apply 2 g topically 4 (four) times daily.     divalproex (DEPAKOTE) 125  MG DR tablet Take 375 mg by mouth 3 (three) times daily. (0800, 1400 & 2000)     donepezil (ARICEPT) 10 MG tablet Take 10 mg by mouth daily at 8 pm.     loperamide (IMODIUM) 2 MG capsule Take 2 mg by mouth as needed for diarrhea or loose stools. Take 2 capsules (4 mg) by mouth with 1st loose stool. Then take 1 capsule (2 mg) by mouth after each loose stool as needed do not exceed 5 doses in 24hrs      LORazepam (ATIVAN) 0.5 MG tablet Take 0.5 mg by mouth every 6 (six) hours as needed for anxiety.     metoprolol succinate (TOPROL-XL) 25 MG 24 hr tablet Take 1 tablet (25 mg total) by mouth daily. 30 tablet 1   montelukast (SINGULAIR) 10 MG tablet Take 10 mg by mouth daily at 8 pm. (2000)     OLANZapine zydis (ZYPREXA) 10 MG disintegrating tablet Take 5 mg by mouth 2 (two) times daily. (0800 & 2000)     pantoprazole (PROTONIX) 40 MG tablet Take 1 tablet (40 mg total) by mouth daily before breakfast. 30 tablet 3   PHENobarbital (LUMINAL) 32.4 MG tablet Take 32.4 mg by mouth at bedtime. (2000)     sucralfate (CARAFATE) 1 GM/10ML suspension TAKE 10 ML (1 GM TOTAL) BY MOUTH FOUR TIMES DAILY WITH MEALS & AT BEDTIME. 1200 mL 0   traZODone (DESYREL) 150 MG tablet Take 150 mg by mouth at bedtime. (2000)     Wheat Dextrin (BENEFIBER DRINK MIX) PACK Take 1 packet by mouth daily. 90 each 3   No current facility-administered medications for this visit.    Allergies as of 07/03/2023   (No Known Allergies)    Family History  Problem Relation Age of Onset   Colon cancer Neg Hx    Colon polyps Neg Hx     Social History   Socioeconomic History   Marital status: Single    Spouse name: Not on file   Number of children: Not on file   Years of education: Not on file   Highest education level: Not on file  Occupational History   Not on file  Tobacco Use   Smoking status: Never   Smokeless tobacco: Never  Vaping Use   Vaping status: Never Used  Substance and Sexual Activity   Alcohol use: No   Drug use: No   Sexual activity: Not Currently  Other Topics Concern   Not on file  Social History Narrative   Not on file   Social Determinants of Health   Financial Resource Strain: Patient Declined (03/06/2022)   Received from [facility], [facility]   Overall Financial Resource Strain (CARDIA)    Difficulty of Paying Living Expenses: Patient declined  Food Insecurity: No Food Insecurity  (05/31/2019)   Hunger Vital Sign    Worried About Running Out of Food in the Last Year: Never true    Ran Out of Food in the Last Year: Never true  Transportation Needs: No Transportation Needs (05/31/2019)   PRAPARE - Administrator, Civil Service (Medical): No    Lack of Transportation (Non-Medical): No  Physical Activity: Inactive (05/31/2019)   Exercise Vital Sign    Days of Exercise per Week: 0 days    Minutes of Exercise per Session: 0 min  Stress: Patient Declined (03/06/2022)   Received from [facility], [facility] of Occupational Health - Occupational Stress Questionnaire  Feeling of Stress : Patient declined  Social Connections: Unknown (03/04/2022)   Received from [facility], [facility]   Social Network    Social Network: Not on file    Review of Systems: Gen: Denies fever, chills, anorexia. Denies fatigue, weakness, weight loss.  CV: Denies chest pain, palpitations, syncope, peripheral edema, and claudication. Resp: Denies dyspnea at rest, cough, wheezing, coughing up blood, and pleurisy. GI: Denies vomiting blood, jaundice, and fecal incontinence.   Denies dysphagia or odynophagia. Derm: Denies rash, itching, dry skin Psych: Denies depression, anxiety, memory loss, confusion. No homicidal or suicidal ideation.  Heme: Denies bruising, bleeding, and enlarged lymph nodes.  Physical Exam: There were no vitals taken for this visit. General:   Alert and oriented. No distress noted. Pleasant and cooperative.  Head:  Normocephalic and atraumatic. Eyes:  Conjuctiva clear without scleral icterus. Heart:  S1, S2 present without murmurs appreciated. Lungs:  Clear to auscultation bilaterally. No wheezes, rales, or rhonchi. No distress.  Abdomen:  +BS, soft, non-tender and non-distended. No rebound or guarding. No HSM or masses noted. Msk:  Symmetrical without gross deformities. Normal posture. Extremities:  Without edema. Neurologic:   Alert and  oriented x4 Psych:  Normal mood and affect.    Assessment:     Plan:  ***   Ermalinda Memos, PA-C [facility] 07/03/2023

## 2023-07-02 ENCOUNTER — Ambulatory Visit: Payer: Medicare Other | Admitting: Gastroenterology

## 2023-07-02 DIAGNOSIS — M6281 Muscle weakness (generalized): Secondary | ICD-10-CM | POA: Diagnosis not present

## 2023-07-03 ENCOUNTER — Ambulatory Visit (INDEPENDENT_AMBULATORY_CARE_PROVIDER_SITE_OTHER): Payer: Medicare Other | Admitting: Gastroenterology

## 2023-07-03 ENCOUNTER — Encounter: Payer: Self-pay | Admitting: Gastroenterology

## 2023-07-03 VITALS — BP 132/85 | HR 80 | Temp 97.7°F | Ht 61.0 in | Wt 199.2 lb

## 2023-07-03 DIAGNOSIS — R198 Other specified symptoms and signs involving the digestive system and abdomen: Secondary | ICD-10-CM

## 2023-07-03 DIAGNOSIS — M6281 Muscle weakness (generalized): Secondary | ICD-10-CM | POA: Diagnosis not present

## 2023-07-03 DIAGNOSIS — R194 Change in bowel habit: Secondary | ICD-10-CM | POA: Diagnosis not present

## 2023-07-03 DIAGNOSIS — R1010 Upper abdominal pain, unspecified: Secondary | ICD-10-CM

## 2023-07-03 DIAGNOSIS — R195 Other fecal abnormalities: Secondary | ICD-10-CM

## 2023-07-03 MED ORDER — BENEFIBER DRINK MIX PO PACK
1.0000 | PACK | Freq: Two times a day (BID) | ORAL | 3 refills | Status: AC
Start: 2023-07-03 — End: ?

## 2023-07-03 MED ORDER — PANTOPRAZOLE SODIUM 40 MG PO TBEC: 40.0000 mg | DELAYED_RELEASE_TABLET | Freq: Two times a day (BID) | ORAL | 3 refills | Status: DC

## 2023-07-03 MED ORDER — LACTAID 3000 UNITS PO TABS
9000.0000 [IU] | ORAL_TABLET | Freq: Every day | ORAL | 1 refills | Status: DC | PRN
Start: 2023-07-03 — End: 2023-09-10

## 2023-07-03 NOTE — Patient Instructions (Addendum)
Please have blood work completed at [facility].  We will get you scheduled for an ultrasound of your gallbladder at [facility].  Avoid fried, fatty, greasy foods.  Increase pantoprazole to 40 mg twice daily 30 minutes before breakfast and dinner.  Increase Benefiber to 1 packet twice daily.  Avoid dairy products or take 3 Lactaid tablets prior to dairy consumption.  Further recommendations to follow labs and ultrasound.  Ermalinda Memos, PA-C [facility]

## 2023-07-07 DIAGNOSIS — M6281 Muscle weakness (generalized): Secondary | ICD-10-CM | POA: Diagnosis not present

## 2023-07-09 DIAGNOSIS — M6281 Muscle weakness (generalized): Secondary | ICD-10-CM | POA: Diagnosis not present

## 2023-07-10 DIAGNOSIS — F419 Anxiety disorder, unspecified: Secondary | ICD-10-CM | POA: Diagnosis not present

## 2023-07-10 DIAGNOSIS — M79675 Pain in left toe(s): Secondary | ICD-10-CM | POA: Diagnosis not present

## 2023-07-10 DIAGNOSIS — F633 Trichotillomania: Secondary | ICD-10-CM | POA: Diagnosis not present

## 2023-07-10 DIAGNOSIS — F251 Schizoaffective disorder, depressive type: Secondary | ICD-10-CM | POA: Diagnosis not present

## 2023-07-10 DIAGNOSIS — B351 Tinea unguium: Secondary | ICD-10-CM | POA: Diagnosis not present

## 2023-07-10 DIAGNOSIS — F02B18 Dementia in other diseases classified elsewhere, moderate, with other behavioral disturbance: Secondary | ICD-10-CM | POA: Diagnosis not present

## 2023-07-10 DIAGNOSIS — M79674 Pain in right toe(s): Secondary | ICD-10-CM | POA: Diagnosis not present

## 2023-07-10 DIAGNOSIS — F5101 Primary insomnia: Secondary | ICD-10-CM | POA: Diagnosis not present

## 2023-07-14 ENCOUNTER — Ambulatory Visit (HOSPITAL_COMMUNITY)
Admission: RE | Admit: 2023-07-14 | Discharge: 2023-07-14 | Disposition: A | Discharge status: Home / Self Care | Payer: Medicare Other | Source: Ambulatory Visit | Attending: Gastroenterology | Admitting: Gastroenterology

## 2023-07-14 DIAGNOSIS — M6281 Muscle weakness (generalized): Secondary | ICD-10-CM | POA: Diagnosis not present

## 2023-07-14 DIAGNOSIS — R1010 Upper abdominal pain, unspecified: Secondary | ICD-10-CM | POA: Insufficient documentation

## 2023-07-14 DIAGNOSIS — R109 Unspecified abdominal pain: Secondary | ICD-10-CM | POA: Diagnosis not present

## 2023-07-15 DIAGNOSIS — I10 Essential (primary) hypertension: Secondary | ICD-10-CM | POA: Diagnosis not present

## 2023-07-15 DIAGNOSIS — R7303 Prediabetes: Secondary | ICD-10-CM | POA: Diagnosis not present

## 2023-07-15 DIAGNOSIS — Z0001 Encounter for general adult medical examination with abnormal findings: Secondary | ICD-10-CM | POA: Diagnosis not present

## 2023-07-24 DIAGNOSIS — Z1331 Encounter for screening for depression: Secondary | ICD-10-CM | POA: Diagnosis not present

## 2023-07-24 DIAGNOSIS — M170 Bilateral primary osteoarthritis of knee: Secondary | ICD-10-CM | POA: Diagnosis not present

## 2023-07-24 DIAGNOSIS — Z1389 Encounter for screening for other disorder: Secondary | ICD-10-CM | POA: Diagnosis not present

## 2023-07-24 DIAGNOSIS — I10 Essential (primary) hypertension: Secondary | ICD-10-CM | POA: Diagnosis not present

## 2023-07-24 DIAGNOSIS — G40909 Epilepsy, unspecified, not intractable, without status epilepticus: Secondary | ICD-10-CM | POA: Diagnosis not present

## 2023-07-24 DIAGNOSIS — F0390 Unspecified dementia without behavioral disturbance: Secondary | ICD-10-CM | POA: Diagnosis not present

## 2023-07-24 DIAGNOSIS — F209 Schizophrenia, unspecified: Secondary | ICD-10-CM | POA: Diagnosis not present

## 2023-07-29 DIAGNOSIS — Z0001 Encounter for general adult medical examination with abnormal findings: Secondary | ICD-10-CM | POA: Diagnosis not present

## 2023-07-29 DIAGNOSIS — R7303 Prediabetes: Secondary | ICD-10-CM | POA: Diagnosis not present

## 2023-07-29 DIAGNOSIS — F0390 Unspecified dementia without behavioral disturbance: Secondary | ICD-10-CM | POA: Diagnosis not present

## 2023-07-29 DIAGNOSIS — I10 Essential (primary) hypertension: Secondary | ICD-10-CM | POA: Diagnosis not present

## 2023-08-01 DIAGNOSIS — R1010 Upper abdominal pain, unspecified: Secondary | ICD-10-CM | POA: Diagnosis not present

## 2023-08-02 LAB — CBC WITH DIFFERENTIAL/PLATELET
Basophils Absolute: 0 10*3/uL (ref 0.0–0.2)
Basos: 0 %
EOS (ABSOLUTE): 0.1 10*3/uL (ref 0.0–0.4)
Eos: 2 %
Hematocrit: 38.9 % (ref 34.0–46.6)
Hemoglobin: 12.6 g/dL (ref 11.1–15.9)
Immature Grans (Abs): 0 10*3/uL (ref 0.0–0.1)
Immature Granulocytes: 0 %
Lymphocytes Absolute: 2.6 10*3/uL (ref 0.7–3.1)
Lymphs: 53 %
MCH: 29.6 pg (ref 26.6–33.0)
MCHC: 32.4 g/dL (ref 31.5–35.7)
MCV: 91 fL (ref 79–97)
Monocytes Absolute: 0.4 10*3/uL (ref 0.1–0.9)
Monocytes: 7 %
Neutrophils Absolute: 1.9 10*3/uL (ref 1.4–7.0)
Neutrophils: 38 %
Platelets: 279 10*3/uL (ref 150–450)
RBC: 4.26 x10E6/uL (ref 3.77–5.28)
RDW: 11.7 % (ref 11.7–15.4)
WBC: 5 10*3/uL (ref 3.4–10.8)

## 2023-08-02 LAB — CMP14+EGFR
ALT: 27 [IU]/L (ref 0–32)
AST: 22 [IU]/L (ref 0–40)
Albumin: 4.3 g/dL (ref 3.9–4.9)
Alkaline Phosphatase: 101 [IU]/L (ref 44–121)
BUN/Creatinine Ratio: 16 (ref 12–28)
BUN: 9 mg/dL (ref 8–27)
Bilirubin Total: 0.3 mg/dL (ref 0.0–1.2)
CO2: 24 mmol/L (ref 20–29)
Calcium: 10 mg/dL (ref 8.7–10.3)
Chloride: 97 mmol/L (ref 96–106)
Creatinine, Ser: 0.56 mg/dL — ABNORMAL LOW (ref 0.57–1.00)
Globulin, Total: 3.6 g/dL (ref 1.5–4.5)
Glucose: 151 mg/dL — ABNORMAL HIGH (ref 70–99)
Potassium: 4.7 mmol/L (ref 3.5–5.2)
Sodium: 137 mmol/L (ref 134–144)
Total Protein: 7.9 g/dL (ref 6.0–8.5)
eGFR: 102 mL/min/{1.73_m2} (ref >59)

## 2023-08-02 LAB — LIPASE: Lipase: 38 U/L (ref 14–72)

## 2023-08-05 DIAGNOSIS — F5101 Primary insomnia: Secondary | ICD-10-CM | POA: Diagnosis not present

## 2023-08-05 DIAGNOSIS — F419 Anxiety disorder, unspecified: Secondary | ICD-10-CM | POA: Diagnosis not present

## 2023-08-05 DIAGNOSIS — F633 Trichotillomania: Secondary | ICD-10-CM | POA: Diagnosis not present

## 2023-08-05 DIAGNOSIS — F251 Schizoaffective disorder, depressive type: Secondary | ICD-10-CM | POA: Diagnosis not present

## 2023-08-05 DIAGNOSIS — F02B18 Dementia in other diseases classified elsewhere, moderate, with other behavioral disturbance: Secondary | ICD-10-CM | POA: Diagnosis not present

## 2023-08-24 DIAGNOSIS — I10 Essential (primary) hypertension: Secondary | ICD-10-CM | POA: Diagnosis not present

## 2023-08-24 DIAGNOSIS — F209 Schizophrenia, unspecified: Secondary | ICD-10-CM | POA: Diagnosis not present

## 2023-08-25 NOTE — Progress Notes (Signed)
 Referring Provider: Fanta, Tesfaye Demissie* Primary Care Physician:  Fanta, Tesfaye Demissie, MD Primary GI Physician: Dr. Cindie  Chief Complaint  Patient presents with   Follow-up    Follow up. Stomach pains and diarrhea     HPI:   Stephanie Vincent is a 68 y.o. female with history of depression, schizophrenia, seizures, HTN, GERD, H. pylori gastritis, anemia, colon polyps, presenting today for follow-up of upper abdominal pain.   Last seen in the office 07/03/23.  Reported intermittent upper abdominal pain about 3 times a week, often after breakfast which included some sort of meat like bacon or sausage.  Reported having some reflux when she was having pain.  Symptoms had been present for the last 2+ months. Similar symptoms in the past that improved with PPI BID. Queried whether symptoms were secondary to gastritis/GERD.  Recommended increasing pantoprazole  to 40 mg twice daily.  Also plan to update labs and obtain RUQ ultrasound.  Patient also reported fluctuating bowel habits.  Diarrhea likely driven by lactose intolerance and overall improved with limiting milk.  Recommended avoiding dairy or taking Lactaid tablets prior to dairy consumption and increasing Benefiber to 1 packet twice a day.  Labs completed 12/13 with no significant abnormalities on CBC, CMP, lipase.  RUQ ultrasound 07/14/2023 with no cholelithiasis or cholecystitis, hepatic steatosis.  Today:  Upper abdominal pain: Patient reporting ongoing upper abdominal pain present most of the day, every day.  Staff member that is with her today states patient seems to be more bothered with this after meals.  Patient confirms this.  Some nausea, but no vomiting.  No heartburn.  She is taking pantoprazole  twice a day.  No oral NSAIDs at this time.  No weight loss.  EGD completed May 2024 for epigastric abdominal pain in the setting of daily meloxicam: nonobstructing and mild Schatzki's ring, gastritis biopsied, normal  examined duodenum. Pathology showed a focal mild chronic inactive gastritis, negative for H. pylori.   Loose stool: Initially reported intermittent loose stools after increasing pantoprazole  to twice daily in May.  However, patient is reporting progressively worsening diarrhea.  States she is having diarrhea every day with 3 watery bowel movements.  Denies nocturnal stools.  She is also noted some blood when she wipes for the last couple of weeks.  She has not notified anyone of this.  She is also been having some lower abdominal pain.  Possibly improved somewhat with a bowel movement.  Denies rectal pain.  States she is getting to Lactaid pills before drinking milk in the morning and is also been getting something for diarrhea every day though this is not documented in her MAR.    While patient was in the office, we called Stephanie Vincent at [facility] who states she does think the patient is getting Lactaid tablets and that she will make sure that this is being documented appropriately.  States that she can monitor patient over the next couple days to see if she is still having diarrhea with the appropriate administration of Lactaid.  Patient does state that milk gives her diarrhea and she is drinking milk 3 times a day but is not getting Lactaid every time.   It was very difficult to obtain a clear history from the patient today due to underlying dementia.   Past Medical History:  Diagnosis Date   Dementia (HCC)    H. pylori infection 06/01/2019   Treated with amoxicillin , Biaxin , Protonix    HTN (hypertension)    Manic depression (HCC)  Schizophrenia (HCC)    Seizures (HCC)     Past Surgical History:  Procedure Laterality Date   BIOPSY  06/01/2019   Procedure: BIOPSY;  Surgeon: Harvey Margo CROME, MD;  [facility];  Service: Endoscopy;;  gastric   BIOPSY  01/14/2023   Procedure: BIOPSY;  Surgeon: Cindie Carlin POUR, DO;  [facility];  Service: Endoscopy;;   COLONOSCOPY   02/2009   Dr. Harvey: slightly torturous colon otherwise normal    COLONOSCOPY N/A 06/02/2019   Procedure: COLONOSCOPY;  Surgeon: Shaaron Lamar HERO, MD; redundant colon, 5 mm polyp in the descending colon, otherwise normal exam.  Path with tubular adenoma.  Recommended repeat in 5 years.   COLONOSCOPY WITH PROPOFOL  N/A 06/01/2019   Procedure: COLONOSCOPY WITH PROPOFOL ;  Surgeon: Harvey Margo CROME, MD; incomplete due to poor prep   ESOPHAGOGASTRODUODENOSCOPY (EGD) WITH PROPOFOL  N/A 06/01/2019   Procedure: ESOPHAGOGASTRODUODENOSCOPY (EGD) WITH PROPOFOL ;  Surgeon: Harvey Margo CROME, MD;  Normal esophagus, gastritis s/p biopsy, normal duodenum.  Pathology positive for H. Pylori.  Treated with amoxicillin , Biaxin , and Protonix .    ESOPHAGOGASTRODUODENOSCOPY (EGD) WITH PROPOFOL  N/A 01/14/2023   Procedure: ESOPHAGOGASTRODUODENOSCOPY (EGD) WITH PROPOFOL ;  Surgeon: Cindie Carlin POUR, DO;  [facility];  Service: Endoscopy;  Laterality: N/A;  1:30pm, asa 3   POLYPECTOMY  06/02/2019   Procedure: POLYPECTOMY;  Surgeon: Shaaron Lamar HERO, MD;  [facility];  Service: Endoscopy;;  colon    Current Outpatient Medications  Medication Sig Dispense Refill   acetaminophen  (TYLENOL ) 325 MG tablet Take 650 mg by mouth every 4 (four) hours as needed (fever/mild pain.).     amLODipine (NORVASC) 5 MG tablet Take 5 mg by mouth in the morning. (0800)     betamethasone dipropionate 0.05 % cream Apply 1 Application topically 2 (two) times daily as needed (rash).     Cholecalciferol (VITAMIN D3) 50 MCG (2000 UT) TABS Take 2,000 Units by mouth in the morning. (0800)     diclofenac Sodium (VOLTAREN) 1 % GEL Apply 2 g topically 4 (four) times daily.     divalproex  (DEPAKOTE ) 125 MG DR tablet Take 375 mg by mouth 3 (three) times daily. (0800, 1400 & 2000)     donepezil  (ARICEPT ) 10 MG tablet Take 10 mg by mouth daily at 8 pm.     lactase (LACTAID) 3000 units tablet Take 3 tablets (9,000 Units total) by mouth  daily as needed (prior to dairy). 90 tablet 1   loperamide (IMODIUM) 2 MG capsule Take 2 mg by mouth as needed for diarrhea or loose stools. Take 2 capsules (4 mg) by mouth with 1st loose stool. Then take 1 capsule (2 mg) by mouth after each loose stool as needed do not exceed 5 doses in 24hrs     LORazepam (ATIVAN) 0.5 MG tablet Take 0.5 mg by mouth every 6 (six) hours as needed for anxiety.     metoprolol  succinate (TOPROL -XL) 25 MG 24 hr tablet Take 1 tablet (25 mg total) by mouth daily. 30 tablet 1   montelukast (SINGULAIR) 10 MG tablet Take 10 mg by mouth daily at 8 pm. (2000)     OLANZapine  zydis (ZYPREXA ) 10 MG disintegrating tablet Take 5 mg by mouth 2 (two) times daily. (0800 & 2000)     pantoprazole  (PROTONIX ) 40 MG tablet Take 1 tablet (40 mg total) by mouth 2 (two) times daily before a meal. 60 tablet 3   PHENobarbital  (LUMINAL) 32.4 MG tablet Take 32.4 mg by mouth  at bedtime. (2000)     sucralfate  (CARAFATE ) 1 GM/10ML suspension TAKE 10 ML (1 GM TOTAL) BY MOUTH FOUR TIMES DAILY WITH MEALS & AT BEDTIME. 1200 mL 0   traZODone  (DESYREL ) 150 MG tablet Take 150 mg by mouth at bedtime. (2000)     Wheat Dextrin (BENEFIBER DRINK MIX) PACK Take 1 packet by mouth 2 (two) times daily. 180 each 3   ARIPiprazole (ABILIFY) 5 MG tablet Take 5 mg by mouth daily. (Patient not taking: Reported on 07/03/2023)     polyethylene glycol-electrolytes (NULYTELY ) 420 g solution Take 4,000 mLs by mouth once for 1 dose. 4000 mL 0   No current facility-administered medications for this visit.    Allergies as of 08/27/2023   (No Known Allergies)    Family History  Problem Relation Age of Onset   Colon cancer Neg Hx    Colon polyps Neg Hx     Social History   Socioeconomic History   Marital status: Single    Spouse name: Not on file   Number of children: Not on file   Years of education: Not on file   Highest education level: Not on file  Occupational History   Not on file  Tobacco Use   Smoking  status: Never   Smokeless tobacco: Never  Vaping Use   Vaping status: Never Used  Substance and Sexual Activity   Alcohol  use: No   Drug use: No   Sexual activity: Not Currently  Other Topics Concern   Not on file  Social History Narrative   Not on file   Social Drivers of Health   Financial Resource Strain: Patient Declined (03/06/2022)   Received from [facility], [facility]   Overall Financial Resource Strain (CARDIA)    Difficulty of Paying Living Expenses: Patient declined  Food Insecurity: No Food Insecurity (05/31/2019)   Hunger Vital Sign    Worried About Running Out of Food in the Last Year: Never true    Ran Out of Food in the Last Year: Never true  Transportation Needs: No Transportation Needs (05/31/2019)   PRAPARE - Administrator, Civil Service (Medical): No    Lack of Transportation (Non-Medical): No  Physical Activity: Inactive (05/31/2019)   Exercise Vital Sign    Days of Exercise per Week: 0 days    Minutes of Exercise per Session: 0 min  Stress: Patient Declined (03/06/2022)   Received from [facility], [facility] of Occupational Health - Occupational Stress Questionnaire    Feeling of Stress : Patient declined  Social Connections: Unknown (03/04/2022)   Received from [facility], [facility]   Social Network    Social Network: Not on file    Review of Systems: Gen: Denies fever, chills, cold or flu like symptoms, pre-syncope, or syncope.  CV: Denies chest pain, palpitations.  Resp: Denies dyspnea, cough.  GI: See HPI Heme: See HPI  Physical Exam: BP 128/84 (BP Location: Right Arm, Patient Position: Sitting, Cuff Size: Large)   Pulse 73   Temp 97.7 F (36.5 C) (Temporal)   Ht 5' 3 (1.6 m)   Wt 203 lb (92.1 kg)   BMI 35.96 kg/m  General:   Alert and oriented. No distress noted. Pleasant and cooperative.  Head:  Normocephalic and atraumatic. Eyes:  Conjuctiva clear without scleral icterus. Heart:   S1, S2 present without murmurs appreciated. Lungs:  Clear to auscultation bilaterally. No wheezes, rales, or rhonchi. No distress.  Abdomen:  +BS,  full, somewhat firm, mild to moderate TTP in epigastric, LUQ, and LLQ. No rebound or guarding. No HSM or masses noted. Msk:  Symmetrical without gross deformities. Normal posture. Extremities:  With trace LE edema bilaterally. Neurologic:  Alert and  oriented x4 Psych:  Normal mood and affect.    Assessment:  68 y.o. female with history of depression, schizophrenia, seizures, HTN, GERD, H. pylori gastritis, anemia, colon polyps, presenting today for follow-up of upper abdominal pain and diarrhea.   Upper abdominal pain:  Ongoing daily intermittent upper abdominal pain, primarily epigastric region.  Previously with similar symptoms s/p EGD in May 2024 showing nonobstructive Schatzki's ring, gastritis with biopsies negative for H. pylori.  Her symptoms initially resolved with pantoprazole  twice daily and discontinuing meloxicam, but returned around September of last year.  No improvement with increasing pantoprazole  to twice a day though heartburn did improve.  She does have some mild nausea, but no vomiting.  Laboratory evaluation including CBC, CMP, lipase on 12/13 along with RUQ ultrasound in November with no significant abnormalities. She has mild to moderate TTP in the epigastric and LUQ region on exam today.   Etiology is unclear. At this point, will obtain CT for further evaluation. If CT is unrevealing, could consider GES to rule out gastroparesis, and/or repeating EGD.   Diarrhea/lower abdominal pain/rectal bleeding:  Difficult to obtain clear history due to underlying dementia and staff not documenting output as patient is continent and does not relay when she has a bowel movement.  Previously with intermittent diarrhea felt to be secondary to lactose intolerance, but now patient reporting daily watery bowel movements despite getting Lactaid 1-2  times a day and also taking Imodium daily though neither of these medications are documented on her MAR and patient states she drinks milk 3 times a day. Also with lower abdominal pain sometimes improved by a BM and now reporting new onset toilet tissue hematochezia for the last couple of weeks. On exam, she has TTP in LLQ, LUQ, and epigastric area.   Differentials include lactose intolerance, IBS, infectious diarrhea, thyroid  abnormalities, celiac disease, and less likely IBD or malignancy. I spoke with Stephanie Vincent at [facility] who states she believes patient is getting Lactaid and is not sure why it is not being documented, but she will make sure that medications are appropriately documented moving forward.   I have recommended checking TSH, celiac serologies, and completing stool studies if diarrhea persist after taking lactaid as prescribed. This was relayed to Stephanie Vincent at [place] [facility]. I have also recommended updating colonoscopy now in light of rectal bleeding rather than waiting for routine surveillance colonoscopy in October.    Plan:  CT A/P with contrast ASAP Check TSH, IgA, ttg IgA.  Proceed with colonoscopy and possible upper endoscopy with propofol  by Dr. Cindie in near future. The risks, benefits, and alternatives have been discussed with the patient in detail. The patient states understanding and desires to proceed.  ASA 3 Take 3 lactaid tablets prior to all dairy consumption (milk, cheese, yogurt, etc). If persistent diarrhea with taking lactaid tablets, complete stool testing.  Continue pantoprazole  40 mg BID.  Follow-up after procedures.    Stephanie Centers, PA-C [facility] 08/27/2023

## 2023-08-26 DIAGNOSIS — N390 Urinary tract infection, site not specified: Secondary | ICD-10-CM | POA: Diagnosis not present

## 2023-08-27 ENCOUNTER — Encounter: Payer: Self-pay | Admitting: *Deleted

## 2023-08-27 ENCOUNTER — Ambulatory Visit (INDEPENDENT_AMBULATORY_CARE_PROVIDER_SITE_OTHER): Payer: Medicare Other | Admitting: Gastroenterology

## 2023-08-27 ENCOUNTER — Encounter: Payer: Self-pay | Admitting: Gastroenterology

## 2023-08-27 ENCOUNTER — Other Ambulatory Visit: Payer: Self-pay | Admitting: *Deleted

## 2023-08-27 VITALS — BP 128/84 | HR 73 | Temp 97.7°F | Ht 63.0 in | Wt 203.0 lb

## 2023-08-27 DIAGNOSIS — R10812 Left upper quadrant abdominal tenderness: Secondary | ICD-10-CM | POA: Diagnosis not present

## 2023-08-27 DIAGNOSIS — R1032 Left lower quadrant pain: Secondary | ICD-10-CM

## 2023-08-27 DIAGNOSIS — R197 Diarrhea, unspecified: Secondary | ICD-10-CM | POA: Diagnosis not present

## 2023-08-27 DIAGNOSIS — R110 Nausea: Secondary | ICD-10-CM | POA: Diagnosis not present

## 2023-08-27 DIAGNOSIS — R10814 Left lower quadrant abdominal tenderness: Secondary | ICD-10-CM

## 2023-08-27 DIAGNOSIS — R1012 Left upper quadrant pain: Secondary | ICD-10-CM

## 2023-08-27 DIAGNOSIS — R1013 Epigastric pain: Secondary | ICD-10-CM

## 2023-08-27 DIAGNOSIS — K921 Melena: Secondary | ICD-10-CM | POA: Diagnosis not present

## 2023-08-27 DIAGNOSIS — R10816 Epigastric abdominal tenderness: Secondary | ICD-10-CM

## 2023-08-27 DIAGNOSIS — K625 Hemorrhage of anus and rectum: Secondary | ICD-10-CM

## 2023-08-27 MED ORDER — PEG 3350-KCL-NA BICARB-NACL 420 G PO SOLR: 4000.0000 mL | Freq: Once | ORAL | 0 refills | Status: AC

## 2023-08-27 NOTE — Patient Instructions (Addendum)
 We will get you scheduled for a colonoscopy and possible upper endoscopy with Dr. Cindie in the near future.   Have blood work completed to check for thyroid  abnormalities and celiac disease.  Take 3 Lactaid tablets prior to any dairy consumption including milk, cheese, yogurt, etc.  If diarrhea persist despite taking Lactaid tablets, please complete stool studies.  Continue pantoprazole  40 mg twice daily.   Josette Centers, PA-C [facility]astroenter[facility]

## 2023-08-28 ENCOUNTER — Encounter: Payer: Self-pay | Admitting: *Deleted

## 2023-09-02 ENCOUNTER — Other Ambulatory Visit: Payer: Self-pay | Admitting: Gastroenterology

## 2023-09-02 DIAGNOSIS — R197 Diarrhea, unspecified: Secondary | ICD-10-CM | POA: Diagnosis not present

## 2023-09-03 LAB — SPECIMEN STATUS REPORT

## 2023-09-04 DIAGNOSIS — F251 Schizoaffective disorder, depressive type: Secondary | ICD-10-CM | POA: Diagnosis not present

## 2023-09-04 DIAGNOSIS — F02B18 Dementia in other diseases classified elsewhere, moderate, with other behavioral disturbance: Secondary | ICD-10-CM | POA: Diagnosis not present

## 2023-09-04 DIAGNOSIS — F419 Anxiety disorder, unspecified: Secondary | ICD-10-CM | POA: Diagnosis not present

## 2023-09-04 DIAGNOSIS — F5101 Primary insomnia: Secondary | ICD-10-CM | POA: Diagnosis not present

## 2023-09-05 LAB — GI PROFILE, STOOL, PCR

## 2023-09-05 LAB — TISSUE TRANSGLUTAMINASE, IGA: Transglutaminase IgA: <2 U/mL (ref 0–3)

## 2023-09-05 LAB — CRYPTOSPORIDIUM EIA: Cryptosporidium EIA: NEGATIVE

## 2023-09-05 LAB — GIARDIA LAMBLIA ANTIBODY, IFA: Giardia Ag, Stl: NEGATIVE

## 2023-09-05 LAB — TSH: TSH: 2.41 u[IU]/mL (ref 0.450–4.500)

## 2023-09-05 LAB — CLOSTRIDIUM DIFFICILE EIA: C difficile Toxins A+B, EIA: NEGATIVE

## 2023-09-10 ENCOUNTER — Other Ambulatory Visit: Payer: Self-pay | Admitting: Gastroenterology

## 2023-09-10 DIAGNOSIS — R198 Other specified symptoms and signs involving the digestive system and abdomen: Secondary | ICD-10-CM

## 2023-09-11 ENCOUNTER — Ambulatory Visit (HOSPITAL_COMMUNITY)
Admission: RE | Admit: 2023-09-11 | Discharge: 2023-09-11 | Disposition: A | Discharge status: Home / Self Care | Payer: Medicare Other | Source: Ambulatory Visit | Attending: Gastroenterology | Admitting: Gastroenterology

## 2023-09-11 DIAGNOSIS — R1032 Left lower quadrant pain: Secondary | ICD-10-CM | POA: Insufficient documentation

## 2023-09-11 DIAGNOSIS — K760 Fatty (change of) liver, not elsewhere classified: Secondary | ICD-10-CM | POA: Diagnosis not present

## 2023-09-11 DIAGNOSIS — R197 Diarrhea, unspecified: Secondary | ICD-10-CM | POA: Insufficient documentation

## 2023-09-11 DIAGNOSIS — R1013 Epigastric pain: Secondary | ICD-10-CM | POA: Diagnosis not present

## 2023-09-11 MED ORDER — IOHEXOL 300 MG/ML  SOLN
100.0000 mL | Freq: Once | INTRAMUSCULAR | Status: AC | PRN
  Administered 2023-09-11: 100 mL via INTRAVENOUS

## 2023-09-23 ENCOUNTER — Other Ambulatory Visit: Payer: Self-pay | Admitting: Gastroenterology

## 2023-09-23 DIAGNOSIS — R1010 Upper abdominal pain, unspecified: Secondary | ICD-10-CM

## 2023-09-24 DIAGNOSIS — F209 Schizophrenia, unspecified: Secondary | ICD-10-CM | POA: Diagnosis not present

## 2023-09-24 DIAGNOSIS — I10 Essential (primary) hypertension: Secondary | ICD-10-CM | POA: Diagnosis not present

## 2023-10-02 DIAGNOSIS — F02B18 Dementia in other diseases classified elsewhere, moderate, with other behavioral disturbance: Secondary | ICD-10-CM | POA: Diagnosis not present

## 2023-10-02 DIAGNOSIS — F5101 Primary insomnia: Secondary | ICD-10-CM | POA: Diagnosis not present

## 2023-10-02 DIAGNOSIS — F419 Anxiety disorder, unspecified: Secondary | ICD-10-CM | POA: Diagnosis not present

## 2023-10-02 DIAGNOSIS — F251 Schizoaffective disorder, depressive type: Secondary | ICD-10-CM | POA: Diagnosis not present

## 2023-10-02 DIAGNOSIS — F633 Trichotillomania: Secondary | ICD-10-CM | POA: Diagnosis not present

## 2023-10-03 ENCOUNTER — Emergency Department (HOSPITAL_COMMUNITY)
Admission: EM | Admit: 2023-10-03 | Discharge: 2023-10-03 | Disposition: A | Discharge status: Skilled Nursing Facility | Payer: Medicare Other | Attending: Emergency Medicine | Admitting: Emergency Medicine

## 2023-10-03 ENCOUNTER — Other Ambulatory Visit: Payer: Self-pay

## 2023-10-03 DIAGNOSIS — F0390 Unspecified dementia without behavioral disturbance: Secondary | ICD-10-CM | POA: Diagnosis not present

## 2023-10-03 DIAGNOSIS — E1165 Type 2 diabetes mellitus with hyperglycemia: Secondary | ICD-10-CM | POA: Diagnosis not present

## 2023-10-03 DIAGNOSIS — I10 Essential (primary) hypertension: Secondary | ICD-10-CM | POA: Diagnosis not present

## 2023-10-03 DIAGNOSIS — R739 Hyperglycemia, unspecified: Secondary | ICD-10-CM | POA: Diagnosis not present

## 2023-10-03 DIAGNOSIS — N390 Urinary tract infection, site not specified: Secondary | ICD-10-CM | POA: Diagnosis not present

## 2023-10-03 DIAGNOSIS — R5381 Other malaise: Secondary | ICD-10-CM | POA: Diagnosis not present

## 2023-10-03 DIAGNOSIS — R350 Frequency of micturition: Secondary | ICD-10-CM

## 2023-10-03 DIAGNOSIS — Z79899 Other long term (current) drug therapy: Secondary | ICD-10-CM | POA: Diagnosis not present

## 2023-10-03 LAB — URINALYSIS, ROUTINE W REFLEX MICROSCOPIC
Bacteria, UA: NONE SEEN
Bilirubin Urine: NEGATIVE
Glucose, UA: >=500 mg/dL — AB
Hgb urine dipstick: NEGATIVE
Ketones, ur: NEGATIVE mg/dL
Nitrite: NEGATIVE
Protein, ur: NEGATIVE mg/dL
Specific Gravity, Urine: 1.035 — ABNORMAL HIGH (ref 1.005–1.030)
pH: 5 (ref 5.0–8.0)

## 2023-10-03 LAB — CBG MONITORING, ED: Glucose-Capillary: 276 mg/dL — ABNORMAL HIGH (ref 70–99)

## 2023-10-03 LAB — BASIC METABOLIC PANEL
Anion gap: 14 (ref 5–15)
BUN: 11 mg/dL (ref 8–23)
CO2: 26 mmol/L (ref 22–32)
Calcium: 9.6 mg/dL (ref 8.9–10.3)
Chloride: 96 mmol/L — ABNORMAL LOW (ref 98–111)
Creatinine, Ser: 0.58 mg/dL (ref 0.44–1.00)
GFR, Estimated: >60 mL/min (ref >60)
Glucose, Bld: 358 mg/dL — ABNORMAL HIGH (ref 70–99)
Potassium: 4.2 mmol/L (ref 3.5–5.1)
Sodium: 136 mmol/L (ref 135–145)

## 2023-10-03 LAB — CBC
HCT: 42.3 % (ref 36.0–46.0)
Hemoglobin: 13.8 g/dL (ref 12.0–15.0)
MCH: 29.4 pg (ref 26.0–34.0)
MCHC: 32.6 g/dL (ref 30.0–36.0)
MCV: 90.2 fL (ref 80.0–100.0)
Platelets: 241 10*3/uL (ref 150–400)
RBC: 4.69 MIL/uL (ref 3.87–5.11)
RDW: 11.9 % (ref 11.5–15.5)
WBC: 6.2 10*3/uL (ref 4.0–10.5)
nRBC: 0 % (ref 0.0–0.2)

## 2023-10-03 MED ORDER — INSULIN ASPART 100 UNIT/ML IJ SOLN
6.0000 [IU] | Freq: Once | INTRAMUSCULAR | Status: AC
  Administered 2023-10-03: 6 [IU] via SUBCUTANEOUS
  Filled 2023-10-03: qty 1

## 2023-10-03 MED ORDER — METFORMIN HCL 500 MG PO TABS: 500.0000 mg | ORAL_TABLET | Freq: Two times a day (BID) | ORAL | 1 refills | Status: DC

## 2023-10-03 MED ORDER — SODIUM CHLORIDE 0.9 % IV BOLUS: 500.0000 mL | Freq: Once | INTRAVENOUS | Status: DC

## 2023-10-03 NOTE — Discharge Instructions (Signed)
It was a pleasure taking part in your care.  You are found to have glucose in your urine which led [place] to evaluate your labs.  Your glucose was elevated here at 358.  We provided you with 6 units of insulin and your glucose decreased to 276.  Please continue hydrating yourself at home.  Please begin taking metformin 500 mg twice a day and follow-up with your primary care doctor for reevaluation.  Please return to the ED with any new or worsening symptoms.

## 2023-10-03 NOTE — ED Notes (Signed)
Pt is a hard stick

## 2023-10-03 NOTE — ED Provider Notes (Signed)
 [place] EMERGENCY DEPARTMENT AT [facility] Provider Note   CSN: [id number] Arrival date & time: 10/03/23  1343     History  Chief Complaint  Patient presents with   Urinary Frequency    Stephanie Vincent is a 68 y.o. female with medical history of dementia, hypertension, schizophrenia, seizures.  The patient presents to the ED for evaluation of urinary frequency, dysuria.  Reports that she has had dysuria for "some time".  She is unable to give me an exact timeline as to when her dysuria began.  She has a baseline history of dementia.  She is also complaining of lower abdominal pain, pain in her right side.  Denies fevers, nausea or vomiting at home.  Reports it feels as if she has a UTI.  Denies any recent antibiotics.   Urinary Frequency       Home Medications Prior to Admission medications   Medication Sig Start Date End Date Taking? Authorizing Provider  metFORMIN (GLUCOPHAGE) 500 MG tablet Take 1 tablet (500 mg total) by mouth 2 (two) times daily with a meal. 10/03/23  Yes Al Decant, PA-C  acetaminophen (TYLENOL) 325 MG tablet Take 650 mg by mouth every 4 (four) hours as needed (fever/mild pain.).    [provider]  amLODipine (NORVASC) 5 MG tablet Take 5 mg by mouth in the morning. (0800)    [provider]  ARIPiprazole (ABILIFY) 5 MG tablet Take 5 mg by mouth daily. Patient not taking: Reported on 07/03/2023    [provider]  betamethasone dipropionate 0.05 % cream Apply 1 Application topically 2 (two) times daily as needed (rash).    [provider]  Cholecalciferol (VITAMIN D3) 50 MCG (2000 UT) TABS Take 2,000 Units by mouth in the morning. (0800) 06/14/22   [provider]  diclofenac Sodium (VOLTAREN) 1 % GEL Apply 2 g topically 4 (four) times daily. 03/26/23   [provider]  divalproex (DEPAKOTE) 125 MG DR tablet Take 375 mg by mouth 3 (three) times daily. (0800, 1400 & 2000)    [provider]  donepezil (ARICEPT) 10 MG tablet Take 10 mg by mouth daily at 8 pm. 12/15/15   [provider]  lactase (LACTAID) 3000 units tablet TAKE (3) TABLETS (9,000 UNITS TOTAL) BY MOUTH DAILY AS NEEDED PRIOR TO INGESTING DAIRY. 09/10/23   Letta Median, PA-C  loperamide (IMODIUM) 2 MG capsule Take 2 mg by mouth as needed for diarrhea or loose stools. Take 2 capsules (4 mg) by mouth with 1st loose stool. Then take 1 capsule (2 mg) by mouth after each loose stool as needed do not exceed 5 doses in 24hrs    [provider]  LORazepam (ATIVAN) 0.5 MG tablet Take 0.5 mg by mouth every 6 (six) hours as needed for anxiety. 04/29/23   [provider]  metoprolol succinate (TOPROL-XL) 25 MG 24 hr tablet Take 1 tablet (25 mg total) by mouth daily. 10/16/20   Mesner, Barbara Cower, MD  montelukast (SINGULAIR) 10 MG tablet Take 10 mg by mouth daily at 8 pm. (2000)    [provider]  OLANZapine zydis (ZYPREXA) 10 MG disintegrating tablet Take 5 mg by mouth 2 (two) times daily. (0800 & 2000) 11/19/22   [provider]  pantoprazole (PROTONIX) 40 MG tablet TAKE (1) TABLET BY MOUTH TWICE DAILY BEFORE A MEAL. 09/24/23   Letta Median, PA-C  PHENobarbital (LUMINAL) 32.4 MG tablet Take 32.4 mg by mouth at bedtime. (2000)  [provider]  sucralfate (CARAFATE) 1 GM/10ML suspension TAKE 10 ML (1 GM TOTAL) BY MOUTH FOUR TIMES DAILY WITH MEALS & AT BEDTIME. 12/24/22   Letta Median, PA-C  traZODone (DESYREL) 150 MG tablet Take 150 mg by mouth at bedtime. (2000)    [provider]  Wheat Dextrin (BENEFIBER DRINK MIX) PACK Take 1 packet by mouth 2 (two) times daily. 07/03/23   Letta Median, PA-C  lisinopril (PRINIVIL,ZESTRIL) 10 MG tablet Take 10 mg by mouth daily.  12/15/15 10/16/20  [provider]      Allergies    Patient has no known allergies.    Review of Systems   Review of Systems  Unable to perform ROS: Dementia (Level 5 caveat)   Genitourinary:  Positive for frequency.    Physical Exam Updated Vital Signs BP 133/80 (BP Location: Right Arm)   Pulse 82   Temp 98.2 F (36.8 C) (Oral)   Resp 16   Ht 5\' 5"  (1.651 m)   Wt 77.1 kg   SpO2 94%   BMI 28.29 kg/m  Physical Exam Vitals and nursing note reviewed.  Constitutional:      General: She is not in acute distress.    Appearance: She is well-developed.  HENT:     Head: Normocephalic and atraumatic.  Eyes:     Conjunctiva/sclera: Conjunctivae normal.  Cardiovascular:     Rate and Rhythm: Normal rate and regular rhythm.     Heart sounds: No murmur heard. Pulmonary:     Effort: Pulmonary effort is normal. No respiratory distress.     Breath sounds: Normal breath sounds.  Abdominal:     Palpations: Abdomen is soft.     Tenderness: There is no abdominal tenderness.     Comments: No TTP of lower abdomen, no rebound or guarding  Musculoskeletal:        General: No swelling.     Cervical back: Neck supple.  Skin:    General: Skin is warm and dry.     Capillary Refill: Capillary refill takes less than 2 seconds.  Neurological:     General: No focal deficit present.     Mental Status: She is alert.     GCS: GCS eye subscore is 4. GCS verbal subscore is 5. GCS motor subscore is 6.     Cranial Nerves: Cranial nerves 2-12 are intact. No cranial nerve deficit.     Sensory: Sensation is intact. No sensory deficit.     Motor: No weakness.     Comments: Reassuring neurological examination without focal neurodeficits  Psychiatric:        Mood and Affect: Mood normal.     ED Results / Procedures / Treatments   Labs (all labs ordered are listed, but only abnormal results are displayed) Labs Reviewed  URINALYSIS, ROUTINE W REFLEX MICROSCOPIC - Abnormal; Notable for the following components:      Result Value   Specific Gravity, Urine 1.035 (*)    Glucose, UA >=500 (*)    Leukocytes,Ua SMALL (*)    All other components within normal limits  BASIC  METABOLIC PANEL - Abnormal; Notable for the following components:   Chloride 96 (*)    Glucose, Bld 358 (*)    All other components within normal limits  CBG MONITORING, ED - Abnormal; Notable for the following components:   Glucose-Capillary 276 (*)    All other components within normal limits  CBC  CBG MONITORING, ED    EKG None  Radiology No results found.  Procedures Procedures    Medications Ordered in ED Medications  insulin aspart (novoLOG) injection 6 Units (6 Units Subcutaneous Given 10/03/23 1756)    ED Course/ Medical Decision Making/ A&P Clinical Course as of 10/03/23 1954  Fri Oct 03, 2023  1924 Metformin, 500BID, follow up primary doc [CG]    Clinical Course User Index [CG] Al Decant, PA-C   Medical Decision Making Amount and/or Complexity of Data Reviewed Labs: ordered.  Risk Prescription drug management.   68 year old female presents for evaluation of urinary frequency.  Please see HPI for further details.  On examination patient is afebrile and nontachycardic.  Her lung sounds are clear bilaterally, she is not hypoxic.  Her abdomen is soft and compressible throughout.  Neurological examinations at baseline without focal neurodeficits.  Overall nontoxic appearance.  Will collect urinalysis and assess for UTI.  Urinalysis shows increased specific gravity, glucose >500 and small leukocytes.  Due to glucose, will get point-of-care CBG, CBC, BMP.  CBC unremarkable without leukocytosis or anemia.  Metabolic panel shows a glucose 358, anion gap 14.  Patient was provided with 6 units of insulin.  CBG reassessed and has decreased to 276.  Patient has no history of diabetes in her chart.  Does not take any diabetic medications.  Will start patient on 500 mg metformin twice daily.  Will have her follow-up with her PCP for reevaluation.  Patient was able to tolerate multiple cups of water prior to discharge.  Discussed findings with patient  facility, [facility], spoke with [facility] LPN. She is aware of concern for elevated blood glucose and patient's new medication.  Metformin sent to patient pharmacy on file.  Will have patient follow-up with PCP.   Final Clinical Impression(s) / ED Diagnoses Final diagnoses:  Urinary frequency  Hyperglycemia    Rx / DC Orders ED Discharge Orders          Ordered    metFORMIN (GLUCOPHAGE) 500 MG tablet  2 times daily with meals        10/03/23 1950              Clent Ridges 10/03/23 1955    Terrilee Files, MD 10/04/23 [phone]

## 2023-10-03 NOTE — ED Triage Notes (Signed)
Pt arrived from [facility] initial call was for HTN but pt is c/o burning with urination. Pt has a hx of dementia.

## 2023-10-06 DIAGNOSIS — B351 Tinea unguium: Secondary | ICD-10-CM | POA: Diagnosis not present

## 2023-10-06 DIAGNOSIS — M79674 Pain in right toe(s): Secondary | ICD-10-CM | POA: Diagnosis not present

## 2023-10-06 DIAGNOSIS — M79675 Pain in left toe(s): Secondary | ICD-10-CM | POA: Diagnosis not present

## 2023-10-07 DIAGNOSIS — I10 Essential (primary) hypertension: Secondary | ICD-10-CM | POA: Diagnosis not present

## 2023-10-07 DIAGNOSIS — N390 Urinary tract infection, site not specified: Secondary | ICD-10-CM | POA: Diagnosis not present

## 2023-10-07 DIAGNOSIS — F0390 Unspecified dementia without behavioral disturbance: Secondary | ICD-10-CM | POA: Diagnosis not present

## 2023-10-07 DIAGNOSIS — E1169 Type 2 diabetes mellitus with other specified complication: Secondary | ICD-10-CM | POA: Diagnosis not present

## 2023-10-07 DIAGNOSIS — M170 Bilateral primary osteoarthritis of knee: Secondary | ICD-10-CM | POA: Diagnosis not present

## 2023-10-07 DIAGNOSIS — R278 Other lack of coordination: Secondary | ICD-10-CM | POA: Diagnosis not present

## 2023-10-07 DIAGNOSIS — M6281 Muscle weakness (generalized): Secondary | ICD-10-CM | POA: Diagnosis not present

## 2023-10-07 DIAGNOSIS — E1165 Type 2 diabetes mellitus with hyperglycemia: Secondary | ICD-10-CM | POA: Diagnosis not present

## 2023-10-13 ENCOUNTER — Telehealth: Payer: Self-pay | Admitting: *Deleted

## 2023-10-13 NOTE — Telephone Encounter (Signed)
 Noted. Can we follow-up with facility in 4 weeks to see if blood sugars have come down consistently? If so, we could proceed with rescheduling.

## 2023-10-13 NOTE — Telephone Encounter (Signed)
 Received call from [place] at [facility]. Patient has been having blood sugars in 300-400 range. Went to ED and was placed on Metformin 500mg  BID starting 2/14. Yesterday her PCP had to increase dose as they have not been able to get her out of the 300's. She is now on Metformin 850mg  BID. They are aware with those numbers she would likely be cancelled since she is not yet controlled with her medication. She will call back once patient is stable on medication to get her rescheduled. FYI

## 2023-10-15 ENCOUNTER — Encounter (HOSPITAL_COMMUNITY): Payer: Medicare Other

## 2023-10-17 ENCOUNTER — Encounter (HOSPITAL_COMMUNITY): Admission: RE | Payer: Self-pay | Source: Home / Self Care

## 2023-10-17 ENCOUNTER — Ambulatory Visit (HOSPITAL_COMMUNITY): Admission: RE | Admit: 2023-10-17 | Payer: Medicare Other | Source: Home / Self Care | Admitting: Internal Medicine

## 2023-10-17 SURGERY — COLONOSCOPY WITH PROPOFOL
Anesthesia: Choice

## 2023-10-30 DIAGNOSIS — F633 Trichotillomania: Secondary | ICD-10-CM | POA: Diagnosis not present

## 2023-10-30 DIAGNOSIS — F02B18 Dementia in other diseases classified elsewhere, moderate, with other behavioral disturbance: Secondary | ICD-10-CM | POA: Diagnosis not present

## 2023-10-30 DIAGNOSIS — F419 Anxiety disorder, unspecified: Secondary | ICD-10-CM | POA: Diagnosis not present

## 2023-10-30 DIAGNOSIS — F251 Schizoaffective disorder, depressive type: Secondary | ICD-10-CM | POA: Diagnosis not present

## 2023-10-30 DIAGNOSIS — F5101 Primary insomnia: Secondary | ICD-10-CM | POA: Diagnosis not present

## 2023-11-03 DIAGNOSIS — U099 Post covid-19 condition, unspecified: Secondary | ICD-10-CM | POA: Diagnosis not present

## 2023-11-03 DIAGNOSIS — E1169 Type 2 diabetes mellitus with other specified complication: Secondary | ICD-10-CM | POA: Diagnosis not present

## 2023-11-03 DIAGNOSIS — R052 Subacute cough: Secondary | ICD-10-CM | POA: Diagnosis not present

## 2023-11-05 ENCOUNTER — Ambulatory Visit (HOSPITAL_COMMUNITY)
Admission: RE | Admit: 2023-11-05 | Discharge: 2023-11-05 | Disposition: A | Discharge status: Home / Self Care | Source: Ambulatory Visit | Attending: Gerontology | Admitting: Gerontology

## 2023-11-05 ENCOUNTER — Other Ambulatory Visit (HOSPITAL_COMMUNITY): Payer: Self-pay | Admitting: Gerontology

## 2023-11-05 DIAGNOSIS — R059 Cough, unspecified: Secondary | ICD-10-CM

## 2023-11-14 ENCOUNTER — Other Ambulatory Visit: Payer: Self-pay | Admitting: Gastroenterology

## 2023-11-14 ENCOUNTER — Telehealth: Payer: Self-pay | Admitting: *Deleted

## 2023-11-14 MED ORDER — SUCRALFATE 1 G PO TABS: 1.0000 g | ORAL_TABLET | Freq: Three times a day (TID) | ORAL | 1 refills | Status: DC

## 2023-11-14 NOTE — Telephone Encounter (Signed)
Can we follow-up on this?

## 2023-11-14 NOTE — Telephone Encounter (Signed)
 Rx [facility] to [facility].   Instructions placed on Rx to dissolve tablet in 15 mL of water, then drink the slurry.

## 2023-11-14 NOTE — Telephone Encounter (Signed)
 Received a denial letter for liquid sucralfate. Pt need to try sucralfate in tablet form. Please send to pharmacy. Spoke to [place] at [facility] informed her of change, she states that should be fine. Informed to call if there is a problem with her swallowing the pills.

## 2023-11-17 NOTE — Telephone Encounter (Signed)
 Called sylvia and she wants to fax over a log for [place] to review. Will await fax

## 2023-11-17 NOTE — Telephone Encounter (Signed)
 Log received and placed on your desk for review

## 2023-11-17 NOTE — Telephone Encounter (Signed)
 Blood sugar appears to be under much better control.  Morning blood sugar typically in the mid to low 100s.  Occasionally higher, but not greater than 201.  At this point, I think we can proceed with scheduling her procedures.  We do need to call the facility to verify her current medications prior to scheduling as she had changes in diabetes meds.  Otherwise, we can proceed.

## 2023-11-17 NOTE — Telephone Encounter (Signed)
 Noted. Informed Nettie Elm of recommendations.

## 2023-11-18 NOTE — Telephone Encounter (Signed)
 Called [facility] and [facility] was not available to schedule. Will call back

## 2023-11-20 NOTE — Telephone Encounter (Signed)
 Called [facility]. No answer and was not able to leave VM

## 2023-11-27 DIAGNOSIS — F633 Trichotillomania: Secondary | ICD-10-CM | POA: Diagnosis not present

## 2023-11-27 DIAGNOSIS — F251 Schizoaffective disorder, depressive type: Secondary | ICD-10-CM | POA: Diagnosis not present

## 2023-11-27 DIAGNOSIS — F419 Anxiety disorder, unspecified: Secondary | ICD-10-CM | POA: Diagnosis not present

## 2023-11-27 DIAGNOSIS — F5101 Primary insomnia: Secondary | ICD-10-CM | POA: Diagnosis not present

## 2023-11-27 DIAGNOSIS — F02B18 Dementia in other diseases classified elsewhere, moderate, with other behavioral disturbance: Secondary | ICD-10-CM | POA: Diagnosis not present

## 2023-12-03 ENCOUNTER — Encounter: Payer: Self-pay | Admitting: *Deleted

## 2023-12-03 MED ORDER — PEG 3350-KCL-NA BICARB-NACL 420 G PO SOLR: 4000.0000 mL | Freq: Once | ORAL | 0 refills | Status: AC

## 2023-12-03 MED ORDER — BISACODYL EC 5 MG PO TBEC: 10.0000 mg | DELAYED_RELEASE_TABLET | Freq: Once | ORAL | 0 refills | Status: AC

## 2023-12-03 MED ORDER — SIMETHICONE 40 MG/0.6ML PO SUSP: ORAL | 0 refills | Status: AC

## 2023-12-03 MED ORDER — FLEET ENEMA RE ENEM: 1.0000 | ENEMA | RECTAL | 0 refills | Status: AC

## 2023-12-03 NOTE — Addendum Note (Signed)
 Addended by: Feliz Hosteller on: 12/03/2023 10:35 AM   Modules accepted: Orders

## 2023-12-03 NOTE — Telephone Encounter (Signed)
 Spoke with Stephanie Vincent. Pt scheduled for 5/23 with Dr. Mordechai April. She advised me to send rx[facility] to [facility]. Fax instructions and pre-op to [phone]

## 2023-12-04 DIAGNOSIS — I10 Essential (primary) hypertension: Secondary | ICD-10-CM | POA: Diagnosis not present

## 2023-12-04 DIAGNOSIS — F209 Schizophrenia, unspecified: Secondary | ICD-10-CM | POA: Diagnosis not present

## 2023-12-19 ENCOUNTER — Other Ambulatory Visit: Payer: Self-pay | Admitting: Gastroenterology

## 2023-12-25 DIAGNOSIS — F5101 Primary insomnia: Secondary | ICD-10-CM | POA: Diagnosis not present

## 2023-12-25 DIAGNOSIS — F251 Schizoaffective disorder, depressive type: Secondary | ICD-10-CM | POA: Diagnosis not present

## 2023-12-25 DIAGNOSIS — F419 Anxiety disorder, unspecified: Secondary | ICD-10-CM | POA: Diagnosis not present

## 2023-12-25 DIAGNOSIS — F02B18 Dementia in other diseases classified elsewhere, moderate, with other behavioral disturbance: Secondary | ICD-10-CM | POA: Diagnosis not present

## 2023-12-25 DIAGNOSIS — F633 Trichotillomania: Secondary | ICD-10-CM | POA: Diagnosis not present

## 2024-01-03 DIAGNOSIS — I10 Essential (primary) hypertension: Secondary | ICD-10-CM | POA: Diagnosis not present

## 2024-01-03 DIAGNOSIS — F209 Schizophrenia, unspecified: Secondary | ICD-10-CM | POA: Diagnosis not present

## 2024-01-06 NOTE — Patient Instructions (Signed)
 Stephanie Vincent  01/06/2024     @PREFPERIOPPHARMACY @   Your procedure is scheduled on  01/09/2024.   Report to [facility] at  0900  A.M.   Call this number if you have problems the morning of surgery:  [phone]  If you experience any cold or flu symptoms such as cough, fever, chills, shortness of breath, etc. between now and your scheduled surgery, please notify us  at the above number.   Remember:        DO NOT take any medications for diabetes the morning of your procedure.    Follow the diet and prep instructions given to you by the office.   You may drink clear liquids until  0700 am on 01/09/2024.   Clear liquids allowed are:                    Water , Juice (No red color; non-citric and without pulp; diabetics please choose diet or no sugar options), Carbonated beverages (diabetics please choose diet or no sugar options), Clear Tea (No creamer, milk, or cream, including half & half and powdered creamer), Black Coffee Only (No creamer, milk or cream, including half & half and powdered creamer), and Clear Sports drink (No red color; diabetics please choose diet or no sugar options)    Take these medicines the morning of surgery with A SIP OF WATER                    amlodipine, divalproex , donepezil , lorazepam (if needed), metoprolol , olanzapine , pantoprazole .     Do not wear jewelry, make-up or nail polish, including gel polish,  artificial nails, or any other type of covering on natural nails (fingers and  toes).  Do not wear lotions, powders, or perfumes, or deodorant.  Do not shave 48 hours prior to surgery.  Men may shave face and neck.  Do not bring valuables to the hospital.  [facility] is not responsible for any belongings or valuables.  Contacts, dentures or bridgework may not be worn into surgery.  Leave your suitcase in the car.  After surgery it may be brought to your room.  For patients admitted to the hospital, discharge time will be  determined by your treatment team.  Patients discharged the day of surgery will not be allowed to drive home and must have someone with them for 24 hours.    Special instructions:   DO NOT smoke tobacco or vape for 24 hours before your procedure.  Please read over the following fact sheets that you were given. Anesthesia Post-op Instructions and Care and Recovery After Surgery      Upper Endoscopy, Adult, Care After After the procedure, it is common to have a sore throat. It is also common to have: Mild stomach pain or discomfort. Bloating. Nausea. Follow these instructions at home: The instructions below may help you care for yourself at home. Your health care provider may give you more instructions. If you have questions, ask your health care provider. If you were given a sedative during the procedure, it can affect you for several hours. Do not drive or operate machinery until your health care provider says that it is safe. If you will be going home right after the procedure, plan to have a responsible adult: Take you home from the hospital or clinic. You will not be allowed to drive. Care for you for the time you are told. Follow instructions from your  health care provider about what you may eat and drink. Return to your normal activities as told by your health care provider. Ask your health care provider what activities are safe for you. Take over-the-counter and prescription medicines only as told by your health care provider. Contact a health care provider if you: Have a sore throat that lasts longer than one day. Have trouble swallowing. Have a fever. Get help right away if you: Vomit blood or your vomit looks like coffee grounds. Have bloody, black, or tarry stools. Have a very bad sore throat or you cannot swallow. Have difficulty breathing or very bad pain in your chest or abdomen. These symptoms may be an emergency. Get help right away. Call 911. Do not wait to see if  the symptoms will go away. Do not drive yourself to the hospital. Summary After the procedure, it is common to have a sore throat, mild stomach discomfort, bloating, and nausea. If you were given a sedative during the procedure, it can affect you for several hours. Do not drive until your health care provider says that it is safe. Follow instructions from your health care provider about what you may eat and drink. Return to your normal activities as told by your health care provider. This information is not intended to replace advice given to you by your health care provider. Make sure you discuss any questions you have with your health care provider. Document Revised: 11/14/2021 Document Reviewed: 11/14/2021 Elsevier Patient Education  2024 Else[employer].Colonoscopy, Adult, Care After The following information offers guidance on how to care for yourself after your procedure. Your health care provider may also give you more specific instructions. If you have problems or questions, contact your health care provider. What can I expect after the procedure? After the procedure, it is common to have: A small amount of blood in your stool for 24 hours after the procedure. Some gas. Mild cramping or bloating of your abdomen. Follow these instructions at home: Eating and drinking  Drink enough fluid to keep your urine pale yellow. Follow instructions from your health care provider about eating or drinking restrictions. Resume your normal diet as told by your health care provider. Avoid heavy or fried foods that are hard to digest. Activity Rest as told by your health care provider. Avoid sitting for a long time without moving. Get up to take short walks every 1-2 hours. This is important to improve blood flow and breathing. Ask for help if you feel weak or unsteady. Return to your normal activities as told by your health care provider. Ask your health care provider what activities are safe for  you. Managing cramping and bloating  Try walking around when you have cramps or feel bloated. If directed, apply heat to your abdomen as told by your health care provider. Use the heat source that your health care provider recommends, such as a moist heat pack or a heating pad. Place a towel between your skin and the heat source. Leave the heat on for 20-30 minutes. Remove the heat if your skin turns bright red. This is especially important if you are unable to feel pain, heat, or cold. You have a greater risk of getting burned. General instructions If you were given a sedative during the procedure, it can affect you for several hours. Do not drive or operate machinery until your health care provider says that it is safe. For the first 24 hours after the procedure: Do not sign important documents. Do not  drink alcohol . Do your regular daily activities at a slower pace than normal. Eat soft foods that are easy to digest. Take over-the-counter and prescription medicines only as told by your health care provider. Keep all follow-up visits. This is important. Contact a health care provider if: You have blood in your stool 2-3 days after the procedure. Get help right away if: You have more than a small spotting of blood in your stool. You have large blood clots in your stool. You have swelling of your abdomen. You have nausea or vomiting. You have a fever. You have increasing pain in your abdomen that is not relieved with medicine. These symptoms may be an emergency. Get help right away. Call 911. Do not wait to see if the symptoms will go away. Do not drive yourself to the hospital. Summary After the procedure, it is common to have a small amount of blood in your stool. You may also have mild cramping and bloating of your abdomen. If you were given a sedative during the procedure, it can affect you for several hours. Do not drive or operate machinery until your health care provider says  that it is safe. Get help right away if you have a lot of blood in your stool, nausea or vomiting, a fever, or increased pain in your abdomen. This information is not intended to replace advice given to you by your health care provider. Make sure you discuss any questions you have with your health care provider. Document Revised: 09/17/2022 Document Reviewed: 03/28/2021 Elsevier Patient Education  2024 Elsevier Inc.General Anesthesia, Adult, Care After The following information offers guidance on how to care for yourself after your procedure. Your health care provider may also give you more specific instructions. If you have problems or questions, contact your health care provider. What can I expect after the procedure? After the procedure, it is common for people to: Have pain or discomfort at the IV site. Have nausea or vomiting. Have a sore throat or hoarseness. Have trouble concentrating. Feel cold or chills. Feel weak, sleepy, or tired (fatigue). Have soreness and body aches. These can affect parts of the body that were not involved in surgery. Follow these instructions at home: For the time period you were told by your health care provider:  Rest. Do not participate in activities where you could fall or become injured. Do not drive or use machinery. Do not drink alcohol . Do not take sleeping pills or medicines that cause drowsiness. Do not make important decisions or sign legal documents. Do not take care of children on your own. General instructions Drink enough fluid to keep your urine pale yellow. If you have sleep apnea, surgery and certain medicines can increase your risk for breathing problems. Follow instructions from your health care provider about wearing your sleep device: Anytime you are sleeping, including during daytime naps. While taking prescription pain medicines, sleeping medicines, or medicines that make you drowsy. Return to your normal activities as told by  your health care provider. Ask your health care provider what activities are safe for you. Take over-the-counter and prescription medicines only as told by your health care provider. Do not use any products that contain nicotine or tobacco. These products include cigarettes, chewing tobacco, and vaping devices, such as e-cigarettes. These can delay incision healing after surgery. If you need help quitting, ask your health care provider. Contact a health care provider if: You have nausea or vomiting that does not get better with medicine. You vomit every  time you eat or drink. You have pain that does not get better with medicine. You cannot urinate or have bloody urine. You develop a skin rash. You have a fever. Get help right away if: You have trouble breathing. You have chest pain. You vomit blood. These symptoms may be an emergency. Get help right away. Call 911. Do not wait to see if the symptoms will go away. Do not drive yourself to the hospital. Summary After the procedure, it is common to have a sore throat, hoarseness, nausea, vomiting, or to feel weak, sleepy, or fatigue. For the time period you were told by your health care provider, do not drive or use machinery. Get help right away if you have difficulty breathing, have chest pain, or vomit blood. These symptoms may be an emergency. This information is not intended to replace advice given to you by your health care provider. Make sure you discuss any questions you have with your health care provider. Document Revised: 11/02/2021 Document Reviewed: 11/02/2021 Elsevier Patient Education  2024 [employer].

## 2024-01-07 ENCOUNTER — Encounter (HOSPITAL_COMMUNITY): Payer: Self-pay

## 2024-01-07 ENCOUNTER — Encounter (HOSPITAL_COMMUNITY)
Admission: RE | Admit: 2024-01-07 | Discharge: 2024-01-07 | Disposition: A | Discharge status: Home / Self Care | Source: Ambulatory Visit | Attending: Internal Medicine | Admitting: Internal Medicine

## 2024-01-07 VITALS — BP 112/84 | HR 71 | Temp 98.0°F | Resp 18 | Ht 65.0 in | Wt 170.0 lb

## 2024-01-07 DIAGNOSIS — E119 Type 2 diabetes mellitus without complications: Secondary | ICD-10-CM | POA: Insufficient documentation

## 2024-01-07 DIAGNOSIS — Z01812 Encounter for preprocedural laboratory examination: Secondary | ICD-10-CM | POA: Insufficient documentation

## 2024-01-07 DIAGNOSIS — D649 Anemia, unspecified: Secondary | ICD-10-CM | POA: Insufficient documentation

## 2024-01-07 HISTORY — DX: Type 2 diabetes mellitus without complications: E11.9

## 2024-01-07 LAB — CBC WITH DIFFERENTIAL/PLATELET
Abs Immature Granulocytes: 0.02 10*3/uL (ref 0.00–0.07)
Basophils Absolute: 0 10*3/uL (ref 0.0–0.1)
Basophils Relative: 1 %
Eosinophils Absolute: 0.1 10*3/uL (ref 0.0–0.5)
Eosinophils Relative: 1 %
HCT: 35.9 % — ABNORMAL LOW (ref 36.0–46.0)
Hemoglobin: 12 g/dL (ref 12.0–15.0)
Immature Granulocytes: 0 %
Lymphocytes Relative: 55 %
Lymphs Abs: 3.3 10*3/uL (ref 0.7–4.0)
MCH: 30.2 pg (ref 26.0–34.0)
MCHC: 33.4 g/dL (ref 30.0–36.0)
MCV: 90.2 fL (ref 80.0–100.0)
Monocytes Absolute: 0.5 10*3/uL (ref 0.1–1.0)
Monocytes Relative: 9 %
Neutro Abs: 2.1 10*3/uL (ref 1.7–7.7)
Neutrophils Relative %: 34 %
Platelets: 274 10*3/uL (ref 150–400)
RBC: 3.98 MIL/uL (ref 3.87–5.11)
RDW: 12.4 % (ref 11.5–15.5)
WBC: 6.1 10*3/uL (ref 4.0–10.5)
nRBC: 0 % (ref 0.0–0.2)

## 2024-01-07 LAB — BASIC METABOLIC PANEL WITH GFR
Anion gap: 10 (ref 5–15)
BUN: 6 mg/dL — ABNORMAL LOW (ref 8–23)
CO2: 26 mmol/L (ref 22–32)
Calcium: 9.4 mg/dL (ref 8.9–10.3)
Chloride: 97 mmol/L — ABNORMAL LOW (ref 98–111)
Creatinine, Ser: 0.46 mg/dL (ref 0.44–1.00)
GFR, Estimated: >60 mL/min (ref >60)
Glucose, Bld: 102 mg/dL — ABNORMAL HIGH (ref 70–99)
Potassium: 3.6 mmol/L (ref 3.5–5.1)
Sodium: 133 mmol/L — ABNORMAL LOW (ref 135–145)

## 2024-01-09 ENCOUNTER — Encounter (HOSPITAL_COMMUNITY)
Admission: RE | Disposition: A | Discharge status: Home / Self Care | Payer: Self-pay | Source: Home / Self Care | Attending: Internal Medicine

## 2024-01-09 ENCOUNTER — Ambulatory Visit (HOSPITAL_COMMUNITY)
Admission: RE | Admit: 2024-01-09 | Discharge: 2024-01-09 | Disposition: A | Discharge status: Home / Self Care | Attending: Internal Medicine | Admitting: Internal Medicine

## 2024-01-09 ENCOUNTER — Ambulatory Visit (HOSPITAL_COMMUNITY)

## 2024-01-09 ENCOUNTER — Other Ambulatory Visit: Payer: Self-pay

## 2024-01-09 ENCOUNTER — Encounter (HOSPITAL_COMMUNITY): Payer: Self-pay | Admitting: Internal Medicine

## 2024-01-09 DIAGNOSIS — K648 Other hemorrhoids: Secondary | ICD-10-CM | POA: Diagnosis not present

## 2024-01-09 DIAGNOSIS — F0393 Unspecified dementia, unspecified severity, with mood disturbance: Secondary | ICD-10-CM | POA: Diagnosis not present

## 2024-01-09 DIAGNOSIS — R1013 Epigastric pain: Secondary | ICD-10-CM

## 2024-01-09 DIAGNOSIS — K529 Noninfective gastroenteritis and colitis, unspecified: Secondary | ICD-10-CM | POA: Diagnosis not present

## 2024-01-09 DIAGNOSIS — K635 Polyp of colon: Secondary | ICD-10-CM | POA: Diagnosis not present

## 2024-01-09 DIAGNOSIS — K6389 Other specified diseases of intestine: Secondary | ICD-10-CM | POA: Diagnosis not present

## 2024-01-09 DIAGNOSIS — E119 Type 2 diabetes mellitus without complications: Secondary | ICD-10-CM | POA: Insufficient documentation

## 2024-01-09 DIAGNOSIS — D123 Benign neoplasm of transverse colon: Secondary | ICD-10-CM | POA: Diagnosis not present

## 2024-01-09 DIAGNOSIS — K259 Gastric ulcer, unspecified as acute or chronic, without hemorrhage or perforation: Secondary | ICD-10-CM | POA: Insufficient documentation

## 2024-01-09 DIAGNOSIS — I10 Essential (primary) hypertension: Secondary | ICD-10-CM | POA: Insufficient documentation

## 2024-01-09 DIAGNOSIS — K2970 Gastritis, unspecified, without bleeding: Secondary | ICD-10-CM | POA: Diagnosis not present

## 2024-01-09 DIAGNOSIS — F319 Bipolar disorder, unspecified: Secondary | ICD-10-CM | POA: Diagnosis not present

## 2024-01-09 DIAGNOSIS — K219 Gastro-esophageal reflux disease without esophagitis: Secondary | ICD-10-CM | POA: Diagnosis not present

## 2024-01-09 DIAGNOSIS — Z7984 Long term (current) use of oral hypoglycemic drugs: Secondary | ICD-10-CM | POA: Insufficient documentation

## 2024-01-09 HISTORY — PX: COLONOSCOPY: SHX5424

## 2024-01-09 HISTORY — PX: ESOPHAGOGASTRODUODENOSCOPY: SHX5428

## 2024-01-09 LAB — GLUCOSE, CAPILLARY: Glucose-Capillary: 112 mg/dL — ABNORMAL HIGH (ref 70–99)

## 2024-01-09 SURGERY — COLONOSCOPY
Anesthesia: General

## 2024-01-09 MED ORDER — PROPOFOL 500 MG/50ML IV EMUL
INTRAVENOUS | Status: DC | PRN
  Administered 2024-01-09: 30 mg via INTRAVENOUS
  Administered 2024-01-09: 50 mg via INTRAVENOUS
  Administered 2024-01-09: 100 ug/kg/min via INTRAVENOUS

## 2024-01-09 MED ORDER — LIDOCAINE 2% (20 MG/ML) 5 ML SYRINGE
INTRAMUSCULAR | Status: DC | PRN
  Administered 2024-01-09: 60 mg via INTRAVENOUS

## 2024-01-09 MED ORDER — LACTATED RINGERS IV SOLN: INTRAVENOUS | Status: DC | PRN

## 2024-01-09 MED ORDER — PHENYLEPHRINE 80 MCG/ML (10ML) SYRINGE FOR IV PUSH (FOR BLOOD PRESSURE SUPPORT)
PREFILLED_SYRINGE | INTRAVENOUS | Status: DC | PRN
Start: 2024-01-09 — End: 2024-01-09
  Administered 2024-01-09 (×2): 80 ug via INTRAVENOUS

## 2024-01-09 MED ORDER — LACTATED RINGERS IV SOLN: INTRAVENOUS | Status: DC

## 2024-01-09 NOTE — Anesthesia Postprocedure Evaluation (Signed)
 Anesthesia Post Note  Patient: Stephanie Vincent  Procedure(s) Performed: COLONOSCOPY EGD (ESOPHAGOGASTRODUODENOSCOPY)  Patient location during evaluation: PACU Anesthesia Type: General Level of consciousness: awake and alert Pain management: pain level controlled Vital Signs Assessment: post-procedure vital signs reviewed and stable Respiratory status: spontaneous breathing, nonlabored ventilation, respiratory function stable and patient connected to nasal cannula oxygen Cardiovascular status: blood pressure returned to baseline and stable Postop Assessment: no apparent nausea or vomiting Anesthetic complications: no  No notable events documented.   Last Vitals:  Vitals:   01/09/24 1044 01/09/24 1146  BP:  131/78  Pulse:  73  Resp:  20  Temp: 36.8 C 36.7 C  SpO2:  100%    Last Pain:  Vitals:   01/09/24 1146  TempSrc: Oral  PainSc: 0-No pain                 Beacher Limerick

## 2024-01-09 NOTE — Anesthesia Preprocedure Evaluation (Addendum)
 Anesthesia Evaluation  Patient identified by MRN, date of birth, ID band Patient awake    Reviewed: Allergy & Precautions, H&P , NPO status , Patient's Chart, lab work & pertinent test results  Airway Mallampati: II  TM Distance: >3 FB Neck ROM: Full    Dental  (+) Edentulous Upper, Edentulous Lower   Pulmonary neg pulmonary ROS   Pulmonary exam normal breath sounds clear to auscultation       Cardiovascular hypertension, Normal cardiovascular exam Rhythm:Regular Rate:Normal     Neuro/Psych Seizures -,  PSYCHIATRIC DISORDERS  Depression Bipolar Disorder Schizophrenia Dementia    GI/Hepatic negative GI ROS, Neg liver ROS,,,  Endo/Other  diabetes    Renal/GU negative Renal ROS  negative genitourinary   Musculoskeletal negative musculoskeletal ROS (+)    Abdominal   Peds negative pediatric ROS (+)  Hematology  (+) Blood dyscrasia, anemia   Anesthesia Other Findings   Reproductive/Obstetrics negative OB ROS                             Anesthesia Physical Anesthesia Plan  ASA: 3  Anesthesia Plan: General   Post-op Pain Management:    Induction: Intravenous  PONV Risk Score and Plan:   Airway Management Planned: Nasal Cannula  Additional Equipment:   Intra-op Plan:   Post-operative Plan:   Informed Consent: I have reviewed the patients History and Physical, chart, labs and discussed the procedure including the risks, benefits and alternatives for the proposed anesthesia with the patient or authorized representative who has indicated his/her understanding and acceptance.     Dental advisory given  Plan Discussed with:   Anesthesia Plan Comments:        Anesthesia Quick Evaluation

## 2024-01-09 NOTE — H&P (Signed)
 Primary Care Physician:  Wyvonna Heidelberg, MD Primary Gastroenterologist:  Dr. Mordechai April  Pre-Procedure History & Physical: HPI:  Stephanie Vincent is a 68 y.o. female is here for an EGD to be performed for GERD, epigastric pain and colonoscopy for diarrhea.   Past Medical History:  Diagnosis Date   Dementia (HCC)    Diabetes mellitus without complication (HCC)    H. pylori infection 06/01/2019   Treated with amoxicillin , Biaxin , Protonix    HTN (hypertension)    Manic depression (HCC)    Schizophrenia (HCC)    Seizures (HCC)     Past Surgical History:  Procedure Laterality Date   BIOPSY  06/01/2019   Procedure: BIOPSY;  Surgeon: Alyce Jubilee, MD;  [facility]: [facility];  Service: Endoscopy;;  gastric   BIOPSY  01/14/2023   Procedure: BIOPSY;  Surgeon: Vinetta Greening, DO;  Location: [facility];  Service: Endoscopy;;   COLONOSCOPY  02/2009   Dr. Nolene Baumgarten: slightly torturous colon otherwise normal    COLONOSCOPY N/A 06/02/2019   Procedure: COLONOSCOPY;  Surgeon: Suzette Espy, MD; redundant colon, 5 mm polyp in the descending colon, otherwise normal exam.  Path with tubular adenoma.  Recommended repeat in 5 years.   COLONOSCOPY WITH PROPOFOL  N/A 06/01/2019   Procedure: COLONOSCOPY WITH PROPOFOL ;  Surgeon: Alyce Jubilee, MD; incomplete due to poor prep   ESOPHAGOGASTRODUODENOSCOPY (EGD) WITH PROPOFOL  N/A 06/01/2019   Procedure: ESOPHAGOGASTRODUODENOSCOPY (EGD) WITH PROPOFOL ;  Surgeon: Alyce Jubilee, MD;  Normal esophagus, gastritis s/p biopsy, normal duodenum.  Pathology positive for H. Pylori.  Treated with amoxicillin , Biaxin , and Protonix .    ESOPHAGOGASTRODUODENOSCOPY (EGD) WITH PROPOFOL  N/A 01/14/2023   Procedure: ESOPHAGOGASTRODUODENOSCOPY (EGD) WITH PROPOFOL ;  Surgeon: Vinetta Greening, DO;  Location: [facility];  Service: Endoscopy;  Laterality: N/A;  1:30pm, asa 3   POLYPECTOMY  06/02/2019   Procedure: POLYPECTOMY;  Surgeon: Suzette Espy, MD;   Location: [facility];  Service: Endoscopy;;  colon    Prior to Admission medications   Medication Sig Start Date End Date Taking? Authorizing Provider  amLODipine (NORVASC) 5 MG tablet Take 5 mg by mouth in the morning. (0800)   Yes [provider]  betamethasone dipropionate 0.05 % cream Apply 1 Application topically 2 (two) times daily as needed (rash).   Yes [provider]  Cholecalciferol (VITAMIN D3) 50 MCG (2000 UT) TABS Take 2,000 Units by mouth in the morning. (0800) 06/14/22  Yes [provider]  diclofenac Sodium (VOLTAREN) 1 % GEL Apply 2 g topically 4 (four) times daily. 03/26/23  Yes [provider]  divalproex  (DEPAKOTE ) 125 MG DR tablet Take 375 mg by mouth 3 (three) times daily. (0800, 1400 & 2000)   Yes [provider]  donepezil  (ARICEPT ) 10 MG tablet Take 10 mg by mouth daily at 8 pm. 12/15/15  Yes [provider]  LORazepam (ATIVAN) 0.5 MG tablet Take 0.5 mg by mouth every 6 (six) hours as needed for anxiety. 04/29/23  Yes [provider]  metFORMIN  (GLUCOPHAGE ) 850 MG tablet Take 850 mg by mouth 2 (two) times daily. 11/01/23  Yes [provider]  metoprolol  succinate (TOPROL -XL) 25 MG 24 hr tablet Take 1 tablet (25 mg total) by mouth daily. 10/16/20  Yes Mesner, Jason, MD  montelukast (SINGULAIR) 10 MG tablet Take 10 mg by mouth daily at 8 pm. (2000)   Yes [provider]  OLANZapine  zydis (ZYPREXA ) 10 MG disintegrating tablet Take 5 mg by mouth 2 (two)  times daily. (0800 & 2000) 11/19/22  Yes [provider]  pantoprazole  (PROTONIX ) 40 MG tablet TAKE (1) TABLET BY MOUTH TWICE DAILY BEFORE A MEAL. 09/24/23  Yes April Knack, Kristen S, PA-C  PHENobarbital  (LUMINAL) 32.4 MG tablet Take 32.4 mg by mouth at bedtime. (2000)   Yes [provider]  simethicone  (SIMETHICONE  DROPS INFANTS) 40 MG/0.6ML drops 20 cc's to prep container 12/03/23  Yes Ninnie Fein, Rolando Cliche, DO  sodium phosphate (FLEET) ENEM  Place 133 mLs (1 enema total) rectally as directed. 12/03/23  Yes Royal Beirne K, DO  sucralfate  (CARAFATE ) 1 g tablet DISSOLVE (1) TAB IN OF WATER  TO MAKE A SLURRY & DRINK 4X DAILY W/ MEALS & AT BEDTIME. 12/22/23  Yes Harper, Kristen S, PA-C  traZODone  (DESYREL ) 150 MG tablet Take 150 mg by mouth at bedtime. (2000)   Yes [provider]  Wheat Dextrin (BENEFIBER DRINK MIX) PACK Take 1 packet by mouth 2 (two) times daily. 07/03/23  Yes Shana Daring S, PA-C  acetaminophen  (TYLENOL ) 325 MG tablet Take 650 mg by mouth every 4 (four) hours as needed (fever/mild pain.).    [provider]  ARIPiprazole (ABILIFY) 5 MG tablet Take 5 mg by mouth daily. Patient not taking: Reported on 07/03/2023    [provider]  lactase (LACTAID) 3000 units tablet TAKE (3) TABLETS (9,000 UNITS TOTAL) BY MOUTH DAILY AS NEEDED PRIOR TO INGESTING DAIRY. 09/10/23   Evander Hills, PA-C  loperamide (IMODIUM) 2 MG capsule Take 2 mg by mouth as needed for diarrhea or loose stools. Take 2 capsules (4 mg) by mouth with 1st loose stool. Then take 1 capsule (2 mg) by mouth after each loose stool as needed do not exceed 5 doses in 24hrs    [provider]  lisinopril  (PRINIVIL ,ZESTRIL ) 10 MG tablet Take 10 mg by mouth daily.  12/15/15 10/16/20  [provider]    Allergies as of 12/03/2023   (No Known Allergies)    Family History  Problem Relation Age of Onset   Colon cancer Neg Hx    Colon polyps Neg Hx     Social History   Socioeconomic History   Marital status: Single    Spouse name: Not on file   Number of children: Not on file   Years of education: Not on file   Highest education level: Not on file  Occupational History   Not on file  Tobacco Use   Smoking status: Never   Smokeless tobacco: Never  Vaping Use   Vaping status: Never Used  Substance and Sexual Activity   Alcohol  use: No   Drug use: No   Sexual activity: Not Currently  Other Topics Concern    Not on file  Social History Narrative   Not on file   Social Drivers of Health   Financial Resource Strain: Patient Declined (03/06/2022)   Received from [facility], [facility]   Overall Financial Resource Strain (CARDIA)    Difficulty of Paying Living Expenses: Patient declined  Food Insecurity: No Food Insecurity (05/31/2019)   Hunger Vital Sign    Worried About Running Out of Food in the Last Year: Never true    Ran Out of Food in the Last Year: Never true  Transportation Needs: No Transportation Needs (05/31/2019)   PRAPARE - Administrator, Civil Service (Medical): No    Lack of Transportation (Non-Medical): No  Physical Activity: Inactive (05/31/2019)   Exercise Vital Sign    Days of Exercise  per Week: 0 days    Minutes of Exercise per Session: 0 min  Stress: Patient Declined (03/06/2022)   Received from [facility], [facility] - Occupational Stress Questionnaire    Feeling of Stress : Patient declined  Social Connections: Unknown (03/04/2022)   Received from [facility], [facility]   Social Network    Social Network: Not on file  Intimate Partner Violence: Unknown (03/04/2022)   [facility], [facility]   HITS    Physically Hurt: Not on file    Insult or Talk Down To: Not on file    Threaten Physical Harm: Not on file    Scream or Curse: Not on file    Review of Systems: General: Negative for fever, chills, fatigue, weakness. Eyes: Negative for vision changes.  ENT: Negative for hoarseness, difficulty swallowing , nasal congestion. CV: Negative for chest pain, angina, palpitations, dyspnea on exertion, peripheral edema.  Respiratory: Negative for dyspnea at rest, dyspnea on exertion, cough, sputum, wheezing.  GI: See history of present illness. GU:  Negative for dysuria, hematuria, urinary incontinence, urinary frequency, nocturnal urination.  MS: Negative for joint pain, low  back pain.  Derm: Negative for rash or itching.  Neuro: Negative for weakness, abnormal sensation, seizure, frequent headaches, memory loss, confusion.  Psych: Negative for anxiety, depression Endo: Negative for unusual weight change.  Heme: Negative for bruising or bleeding. Allergy: Negative for rash or hives.  Physical Exam: Vital signs in last 24 hours: Temp:  [98.3 F (36.8 C)] 98.3 F (36.8 C) (05/23 1044) Pulse Rate:  [77] 77 (05/23 1034) Resp:  [19] 19 (05/23 1034) BP: (154)/(88) 154/88 (05/23 1034) SpO2:  [99 %] 99 % (05/23 1034) Weight:  [77.1 kg] 77.1 kg (05/23 1034)   General:   Alert,  Well-developed, well-nourished, pleasant and cooperative in NAD Head:  Normocephalic and atraumatic. Eyes:  Sclera clear, no icterus.   Conjunctiva pink. Ears:  Normal auditory acuity. Nose:  No deformity, discharge,  or lesions. Msk:  Symmetrical without gross deformities. Normal posture. Extremities:  Without clubbing or edema. Neurologic:  Alert and  oriented x4;  grossly normal neurologically. Skin:  Intact without significant lesions or rashes. Psych:  Alert and cooperative. Normal mood and affect.   Impression/Plan: SHACORA ZYNDA is here for an EGD to be performed for GERD, epigastric pain and colonoscopy for diarrhea.   Risks, benefits, limitations, imponderables and alternatives regarding procedure have been reviewed with the patient. Questions have been answered. All parties agreeable.

## 2024-01-09 NOTE — Progress Notes (Signed)
 Caregiver Angel at patient's side during Preop

## 2024-01-09 NOTE — Discharge Instructions (Addendum)
 EGD Discharge instructions Please read the instructions outlined below and refer to this sheet in the next few weeks. These discharge instructions provide you with general information on caring for yourself after you leave the hospital. Your doctor may also give you specific instructions. While your treatment has been planned according to the most current medical practices available, unavoidable complications occasionally occur. If you have any problems or questions after discharge, please call your doctor. ACTIVITY You may resume your regular activity but move at a slower pace for the next 24 hours.  Take frequent rest periods for the next 24 hours.  Walking will help expel (get rid of) the air and reduce the bloated feeling in your abdomen.  No driving for 24 hours (because of the anesthesia (medicine) used during the test).  You may shower.  Do not sign any important legal documents or operate any machinery for 24 hours (because of the anesthesia used during the test).  NUTRITION Drink plenty of fluids.  You may resume your normal diet.  Begin with a light meal and progress to your normal diet.  Avoid alcoholic beverages for 24 hours or as instructed by your caregiver.  MEDICATIONS You may resume your normal medications unless your caregiver tells you otherwise.  WHAT YOU CAN EXPECT TODAY You may experience abdominal discomfort such as a feeling of fullness or "gas" pains.  FOLLOW-UP Your doctor will discuss the results of your test with you.  SEEK IMMEDIATE MEDICAL ATTENTION IF ANY OF THE FOLLOWING OCCUR: Excessive nausea (feeling sick to your stomach) and/or vomiting.  Severe abdominal pain and distention (swelling).  Trouble swallowing.  Temperature over 101 F (37.8 C).  Rectal bleeding or vomiting of blood.    Colonoscopy Discharge Instructions  Read the instructions outlined below and refer to this sheet in the next few weeks. These discharge instructions provide you with  general information on caring for yourself after you leave the hospital. Your doctor may also give you specific instructions. While your treatment has been planned according to the most current medical practices available, unavoidable complications occasionally occur.   ACTIVITY You may resume your regular activity, but move at a slower pace for the next 24 hours.  Take frequent rest periods for the next 24 hours.  Walking will help get rid of the air and reduce the bloated feeling in your belly (abdomen).  No driving for 24 hours (because of the medicine (anesthesia) used during the test).   Do not sign any important legal documents or operate any machinery for 24 hours (because of the anesthesia used during the test).  NUTRITION Drink plenty of fluids.  You may resume your normal diet as instructed by your doctor.  Begin with a light meal and progress to your normal diet. Heavy or fried foods are harder to digest and may make you feel sick to your stomach (nauseated).  Avoid alcoholic beverages for 24 hours or as instructed.  MEDICATIONS You may resume your normal medications unless your doctor tells you otherwise.  WHAT YOU CAN EXPECT TODAY Some feelings of bloating in the abdomen.  Passage of more gas than usual.  Spotting of blood in your stool or on the toilet paper.  IF YOU HAD POLYPS REMOVED DURING THE COLONOSCOPY: No aspirin products for 7 days or as instructed.  No alcohol  for 7 days or as instructed.  Eat a soft diet for the next 24 hours.  FINDING OUT THE RESULTS OF YOUR TEST Not all test results are available  during your visit. If your test results are not back during the visit, make an appointment with your caregiver to find out the results. Do not assume everything is normal if you have not heard from your caregiver or the medical facility. It is important for you to follow up on all of your test results.  SEEK IMMEDIATE MEDICAL ATTENTION IF: You have more than a spotting of  blood in your stool.  Your belly is swollen (abdominal distention).  You are nauseated or vomiting.  You have a temperature over 101.  You have abdominal pain or discomfort that is severe or gets worse throughout the day.   Your EGD revealed mild amount inflammation in your stomach.  I took biopsies of this to rule out infection with a bacteria called H. pylori.  Await pathology results, my office will contact you.  Esophagus and small bowel were normal.   Continue on pantoprazole  twice daily.   Your colonoscopy revealed 1 polyp(s) which I removed successfully. Await pathology results, my office will contact you. I recommend repeating colonoscopy in 7-10 years for surveillance purposes.   Overall, your colon appeared very healthy.  I did not see any active inflammation indicative of underlying inflammatory bowel disease such as Crohn's disease or ulcerative colitis throughout your colon or end portion of your small bowel.  I took biopsies of your colon to further evaluate.  Await pathology results, my office will contact you.  Follow up in GI office in 2-3 months  I hope you have a great rest of your week!  Rolando Cliche. Mordechai April, D.O. Gastroenterology and Hepatology [facility]

## 2024-01-09 NOTE — Op Note (Signed)
 [facility] Patient Name: Stephanie Vincent Procedure Date: 01/09/2024 10:54 AM MRN: [id number] Date of Birth: Feb 08, 1956 Attending MD: Rolando Cliche. Mordechai April , [place], [id number] CSN: [id number] Age: 68 Admit Type: Outpatient Procedure:                Colonoscopy Indications:              Chronic diarrhea Providers:                Rolando Cliche. Mordechai April, DO, Willena Harp, Sharlette Dayhoff Technician, Technician Referring MD:              Medicines:                See the Anesthesia note for documentation of the                            administered medications Complications:            No immediate complications. Estimated Blood Loss:     Estimated blood loss was minimal. Procedure:                Pre-Anesthesia Assessment:                           - The anesthesia plan was to use monitored                            anesthesia care (MAC).                           After obtaining informed consent, the colonoscope                            was passed under direct vision. Throughout the                            procedure, the patient's blood pressure, pulse, and                            oxygen saturations were monitored continuously. The                            [id number]-HQ190L ([id number]) scope was introduced through                            the anus and advanced to the the terminal ileum,                            with identification of the appendiceal orifice and                            IC valve. The colonoscopy was performed without                            difficulty. The patient tolerated the procedure  well. The quality of the bowel preparation was                            evaluated using the BBPS [facility] Bowel Preparation                            Scale) with scores of: Right Colon = 3, Transverse                            Colon = 3 and Left Colon = 3 (entire mucosa seen                            well with no  residual staining, small fragments of                            stool or opaque liquid). The total BBPS score                            equals 9. Scope In: 11:29:12 AM Scope Out: 11:39:14 AM Scope Withdrawal Time: 0 hours 8 minutes 1 second  Total Procedure Duration: 0 hours 10 minutes 2 seconds  Findings:      Non-bleeding internal hemorrhoids were found.      A 4 mm polyp was found in the transverse colon. The polyp was sessile.       The polyp was removed with a cold snare. Resection and retrieval were       complete.      The terminal ileum appeared normal.      The exam was otherwise without abnormality.      Biopsies for histology were taken with a cold forceps from the ascending       colon, transverse colon and descending colon for evaluation of       microscopic colitis. Impression:               - Non-bleeding internal hemorrhoids.                           - One 4 mm polyp in the transverse colon, removed                            with a cold snare. Resected and retrieved.                           - The examined portion of the ileum was normal.                           - The examination was otherwise normal.                           - Biopsies were taken with a cold forceps from the                            ascending colon, transverse colon and descending  colon for evaluation of microscopic colitis. Moderate Sedation:      Per Anesthesia Care Recommendation:           - Patient has a contact number available for                            emergencies. The signs and symptoms of potential                            delayed complications were discussed with the                            patient. Return to normal activities tomorrow.                            Written discharge instructions were provided to the                            patient.                           - Resume previous diet.                           - Continue present  medications.                           - Await pathology results.                           - Repeat colonoscopy in 7-10 years for surveillance.                           - Return to GI clinic in 3 months. Procedure Code(s):        --- Professional ---                           [phone], Colonoscopy, flexible; with removal of                            tumor(s), polyp(s), or other lesion(s) by snare                            technique                           [id number], 59, Colonoscopy, flexible; with biopsy,                            single or multiple Diagnosis Code(s):        --- Professional ---                           D12.3, Benign neoplasm of transverse colon (hepatic                            flexure or splenic flexure)  K64.8, Other hemorrhoids                           K52.9, Noninfective gastroenteritis and colitis,                            unspecified CPT copyright 2022 American Medical Association. All rights reserved. The codes documented in this report are preliminary and upon coder review may  be revised to meet current compliance requirements. Rolando Cliche. Mordechai April, DO Rolando Cliche. Mordechai April, DO 01/09/2024 11:42:12 AM This report has been signed electronically. Number of Addenda: 0

## 2024-01-09 NOTE — Op Note (Signed)
 [facility] Patient Name: Stephanie Vincent Procedure Date: 01/09/2024 10:58 AM MRN: [id number] Date of Birth: 13-Jun-1956 Attending MD: Rolando Cliche. Mordechai April , [place], [id number] CSN: [id number] Age: 68 Admit Type: Outpatient Procedure:                Upper GI endoscopy Indications:              Epigastric abdominal pain, Heartburn Providers:                Rolando Cliche. Mordechai April, DO, Willena Harp, Sharlette Dayhoff Technician, Technician Referring MD:              Medicines:                See the Anesthesia note for documentation of the                            administered medications Complications:            No immediate complications. Estimated Blood Loss:     Estimated blood loss was minimal. Procedure:                Pre-Anesthesia Assessment:                           - The anesthesia plan was to use monitored                            anesthesia care (MAC).                           After obtaining informed consent, the endoscope was                            passed under direct vision. Throughout the                            procedure, the patient's blood pressure, pulse, and                            oxygen saturations were monitored continuously. The                            [id number]-H190 ([id number]) scope was introduced through the                            mouth, and advanced to the second part of duodenum.                            The upper GI endoscopy was accomplished without                            difficulty. The patient tolerated the procedure                            well. Scope In:  11:22:51 AM Scope Out: 11:25:35 AM Total Procedure Duration: 0 hours 2 minutes 44 seconds  Findings:      The Z-line was regular and was found 36 cm from the incisors.      Patchy moderate inflammation characterized by erosions and erythema was       found in the gastric body and in the gastric antrum. Biopsies were taken       with a cold forceps for  Helicobacter pylori testing.      The duodenal bulb, first portion of the duodenum and second portion of       the duodenum were normal. Impression:               - Z-line regular, 36 cm from the incisors.                           - Gastritis. Biopsied.                           - Normal duodenal bulb, first portion of the                            duodenum and second portion of the duodenum. Moderate Sedation:      Per Anesthesia Care Recommendation:           - Patient has a contact number available for                            emergencies. The signs and symptoms of potential                            delayed complications were discussed with the                            patient. Return to normal activities tomorrow.                            Written discharge instructions were provided to the                            patient.                           - Resume previous diet.                           - Continue present medications.                           - Await pathology results.                           - Return to GI clinic in 3 months.                           - Use a proton pump inhibitor PO BID. Procedure Code(s):        --- Professional ---  81191, Esophagogastroduodenoscopy, flexible,                            transoral; with biopsy, single or multiple Diagnosis Code(s):        --- Professional ---                           K29.70, Gastritis, unspecified, without bleeding                           R10.13, Epigastric pain                           R12, Heartburn CPT copyright 2022 American Medical Association. All rights reserved. The codes documented in this report are preliminary and upon coder review may  be revised to meet current compliance requirements. Rolando Cliche. Mordechai April, DO Rolando Cliche. Mordechai April, DO 01/09/2024 11:28:24 AM This report has been signed electronically. Number of Addenda: 0

## 2024-01-09 NOTE — Transfer of Care (Signed)
 Immediate Anesthesia Transfer of Care Note  Patient: Stephanie Vincent  Procedure(s) Performed: COLONOSCOPY EGD (ESOPHAGOGASTRODUODENOSCOPY)  Patient Location: PACU and Endoscopy Unit  Anesthesia Type:MAC  Level of Consciousness: awake and alert   Airway & Oxygen Therapy: Patient Spontanous Breathing and Patient connected to nasal cannula oxygen  Post-op Assessment: Report given to RN and Post -op Vital signs reviewed and stable  Post vital signs: Reviewed and stable  Last Vitals:  Vitals Value Taken Time  BP    Temp    Pulse    Resp    SpO2      Last Pain:  Vitals:   01/09/24 1114  TempSrc:   PainSc: 0-No pain      Patients Stated Pain Goal: 2 (01/09/24 1034)  Complications: No notable events documented.

## 2024-01-13 ENCOUNTER — Encounter (HOSPITAL_COMMUNITY): Payer: Self-pay | Admitting: Internal Medicine

## 2024-01-13 LAB — SURGICAL PATHOLOGY

## 2024-01-21 DIAGNOSIS — F259 Schizoaffective disorder, unspecified: Secondary | ICD-10-CM | POA: Diagnosis not present

## 2024-01-21 DIAGNOSIS — F0390 Unspecified dementia without behavioral disturbance: Secondary | ICD-10-CM | POA: Diagnosis not present

## 2024-01-22 ENCOUNTER — Ambulatory Visit (HOSPITAL_COMMUNITY)
Admission: RE | Admit: 2024-01-22 | Discharge: 2024-01-22 | Disposition: A | Discharge status: Home / Self Care | Source: Ambulatory Visit | Attending: Gerontology | Admitting: Gerontology

## 2024-01-22 ENCOUNTER — Other Ambulatory Visit (HOSPITAL_COMMUNITY): Payer: Self-pay | Admitting: Gerontology

## 2024-01-22 DIAGNOSIS — F5101 Primary insomnia: Secondary | ICD-10-CM | POA: Diagnosis not present

## 2024-01-22 DIAGNOSIS — G40909 Epilepsy, unspecified, not intractable, without status epilepticus: Secondary | ICD-10-CM | POA: Diagnosis not present

## 2024-01-22 DIAGNOSIS — E1169 Type 2 diabetes mellitus with other specified complication: Secondary | ICD-10-CM | POA: Diagnosis not present

## 2024-01-22 DIAGNOSIS — F251 Schizoaffective disorder, depressive type: Secondary | ICD-10-CM | POA: Diagnosis not present

## 2024-01-22 DIAGNOSIS — F02B18 Dementia in other diseases classified elsewhere, moderate, with other behavioral disturbance: Secondary | ICD-10-CM | POA: Diagnosis not present

## 2024-01-22 DIAGNOSIS — M79671 Pain in right foot: Secondary | ICD-10-CM | POA: Diagnosis not present

## 2024-01-22 DIAGNOSIS — M170 Bilateral primary osteoarthritis of knee: Secondary | ICD-10-CM | POA: Diagnosis not present

## 2024-01-22 DIAGNOSIS — F209 Schizophrenia, unspecified: Secondary | ICD-10-CM | POA: Diagnosis not present

## 2024-01-22 DIAGNOSIS — F633 Trichotillomania: Secondary | ICD-10-CM | POA: Diagnosis not present

## 2024-01-22 DIAGNOSIS — F419 Anxiety disorder, unspecified: Secondary | ICD-10-CM | POA: Diagnosis not present

## 2024-01-23 ENCOUNTER — Ambulatory Visit: Payer: Self-pay | Admitting: Internal Medicine

## 2024-02-16 ENCOUNTER — Other Ambulatory Visit: Payer: Self-pay | Admitting: Gastroenterology

## 2024-02-19 DIAGNOSIS — F633 Trichotillomania: Secondary | ICD-10-CM | POA: Diagnosis not present

## 2024-02-19 DIAGNOSIS — F02B18 Dementia in other diseases classified elsewhere, moderate, with other behavioral disturbance: Secondary | ICD-10-CM | POA: Diagnosis not present

## 2024-02-19 DIAGNOSIS — F5101 Primary insomnia: Secondary | ICD-10-CM | POA: Diagnosis not present

## 2024-02-19 DIAGNOSIS — F251 Schizoaffective disorder, depressive type: Secondary | ICD-10-CM | POA: Diagnosis not present

## 2024-02-19 DIAGNOSIS — F419 Anxiety disorder, unspecified: Secondary | ICD-10-CM | POA: Diagnosis not present

## 2024-02-21 DIAGNOSIS — F209 Schizophrenia, unspecified: Secondary | ICD-10-CM | POA: Diagnosis not present

## 2024-02-21 DIAGNOSIS — I10 Essential (primary) hypertension: Secondary | ICD-10-CM | POA: Diagnosis not present

## 2024-02-23 ENCOUNTER — Encounter: Payer: Self-pay | Admitting: Internal Medicine

## 2024-03-03 ENCOUNTER — Other Ambulatory Visit (INDEPENDENT_AMBULATORY_CARE_PROVIDER_SITE_OTHER): Payer: Self-pay

## 2024-03-03 ENCOUNTER — Ambulatory Visit: Admitting: Orthopedic Surgery

## 2024-03-03 VITALS — BP 138/87 | HR 67 | Ht 65.0 in | Wt 183.0 lb

## 2024-03-03 DIAGNOSIS — M19072 Primary osteoarthritis, left ankle and foot: Secondary | ICD-10-CM | POA: Diagnosis not present

## 2024-03-03 DIAGNOSIS — M79672 Pain in left foot: Secondary | ICD-10-CM | POA: Diagnosis not present

## 2024-03-04 ENCOUNTER — Encounter: Payer: Self-pay | Admitting: Orthopedic Surgery

## 2024-03-04 NOTE — Progress Notes (Signed)
 Orthopaedic Clinic Return  Assessment: Stephanie Vincent is a 68 y.o. female with the following: Bilateral midfoot arthritis  Plan: XR with midfoot arthritis.  Bunion deformity Current medicines are PRN.  She lacks mental capacity to ask for medicines.  Consider scheduling medicines.  Shoes with more support Consider podiatrist for injections.   Follow-up: Return if symptoms worsen or fail to improve.   Subjective:  Chief Complaint  Patient presents with   Foot Pain    Bilat R > L for 5-6 mos.     History of Present Illness: Stephanie Vincent is a 68 y.o. female who returns to clinic for evaluation of bilateral foot pain.  Progressively worsening.  No specific injury.  Tylenol  helps but is PRN and she is unable to request on a consistent basis.  Review of Systems: No fevers or chills No numbness or tingling No chest pain No shortness of breath No bowel or bladder dysfunction No GI distress No headaches   Objective: BP 138/87   Pulse 67   Ht 5' 5 (1.651 m)   Wt 183 lb (83 kg)   BMI 30.45 kg/m   Physical Exam:  Alert.  Confusion.  Poor historian.   B feet wit bunion deformity.  Toes WWP.    IMAGING: I personally ordered and reviewed the following images:  XR of the left foot was obtained in clinic today.  No acute injury.  Bunion deformity.  Midfoot arthritis.  No bony lesions.   Impression:  Left foot XR with bunion and midfoot arthritis.    Oneil Stephanie Horde, MD 03/04/2024 11:20 PM

## 2024-03-05 ENCOUNTER — Telehealth: Payer: Self-pay | Admitting: Orthopedic Surgery

## 2024-03-05 DIAGNOSIS — M19071 Primary osteoarthritis, right ankle and foot: Secondary | ICD-10-CM | POA: Diagnosis not present

## 2024-03-05 DIAGNOSIS — I10 Essential (primary) hypertension: Secondary | ICD-10-CM | POA: Diagnosis not present

## 2024-03-05 DIAGNOSIS — N390 Urinary tract infection, site not specified: Secondary | ICD-10-CM | POA: Diagnosis not present

## 2024-03-05 DIAGNOSIS — E1169 Type 2 diabetes mellitus with other specified complication: Secondary | ICD-10-CM | POA: Diagnosis not present

## 2024-03-05 NOTE — Telephone Encounter (Signed)
 Spoke w/ pharmacy, due to pt being in a long term facility they need [facility] rx [facility] to [facility] for the acetaminophen . Please assist.

## 2024-03-05 NOTE — Telephone Encounter (Signed)
 Dr. Onesimo pt - [facility] in [place] [phone] lvm stating this patient is a resident at [facility] and the facility doesn't know what to do with the medication information.  She would like a callback to clarify.

## 2024-03-08 ENCOUNTER — Telehealth: Payer: Self-pay | Admitting: Orthopedic Surgery

## 2024-03-08 MED ORDER — ACETAMINOPHEN 325 MG PO TABS: 650.0000 mg | ORAL_TABLET | Freq: Two times a day (BID) | ORAL | 0 refills | Status: AC

## 2024-03-08 NOTE — Telephone Encounter (Signed)
 Dr. Onesimo pt Stephanie Vincent w/[facility] [phone] lvm regarding the Tylenol  325mg , stated it was only written for a 15 day supply, but the facility thinks it's supposed to be a maintenance medication.  She stated that she can add it to her monthly cycle, it would need to be for 30 day in order to do that.

## 2024-03-11 DIAGNOSIS — M79674 Pain in right toe(s): Secondary | ICD-10-CM | POA: Diagnosis not present

## 2024-03-11 DIAGNOSIS — M79675 Pain in left toe(s): Secondary | ICD-10-CM | POA: Diagnosis not present

## 2024-03-11 DIAGNOSIS — B351 Tinea unguium: Secondary | ICD-10-CM | POA: Diagnosis not present

## 2024-03-11 NOTE — Telephone Encounter (Signed)
 Dr. Onesimo pt GLENWOOD Millman w/[facility] [phone] lvm stating that she called on Monday, 03/08/24 and still has not gotten a call back regarding the Tylenol  325mg , stated it was only written for a 15 day supply, but the facility thinks it's supposed to be a maintenance medication.  She stated that she can add it to her monthly cycle, it would need to be for 30 day in order to do that.

## 2024-03-12 ENCOUNTER — Telehealth: Payer: Self-pay | Admitting: Orthopedic Surgery

## 2024-03-12 NOTE — Telephone Encounter (Signed)
 Rachael from [facility] called about an update on last chart note concerning pt script for Tylenol . Please call Vernell at [phone].

## 2024-03-12 NOTE — Telephone Encounter (Signed)
 Spoke w/ Vernell no further questions or concerns.

## 2024-03-17 ENCOUNTER — Telehealth: Payer: Self-pay | Admitting: Orthopedic Surgery

## 2024-03-17 NOTE — Telephone Encounter (Signed)
 I called Kyra advised  ok to d/c the Tylenol  Dr Onesimo prescribed since PCP also ordered/ and it is a set schedule/ not PRN . I also advised if patient desires injections of her feet they can schedule appointment with podiatry. [phone] is fax number, I have faxed to advise ok to d/c Dr Onesimo order for the Tylenol .

## 2024-03-17 NOTE — Telephone Encounter (Signed)
 Kyra from [facility] called requesting a call from Dr Onesimo office concerning meds management and next plan of care for pt. Pt has 2 different scripts of tylenol . Pt PCP sent in script for tylenol  also and need Onesimo to DC order . Also pt complaining of bil feet and need to know if Onesimo is moving forward with injections. Please call Kyra at [phone].

## 2024-03-18 DIAGNOSIS — F5101 Primary insomnia: Secondary | ICD-10-CM | POA: Diagnosis not present

## 2024-03-18 DIAGNOSIS — F251 Schizoaffective disorder, depressive type: Secondary | ICD-10-CM | POA: Diagnosis not present

## 2024-03-18 DIAGNOSIS — F633 Trichotillomania: Secondary | ICD-10-CM | POA: Diagnosis not present

## 2024-03-18 DIAGNOSIS — F02B18 Dementia in other diseases classified elsewhere, moderate, with other behavioral disturbance: Secondary | ICD-10-CM | POA: Diagnosis not present

## 2024-04-05 DIAGNOSIS — I10 Essential (primary) hypertension: Secondary | ICD-10-CM | POA: Diagnosis not present

## 2024-04-05 DIAGNOSIS — F209 Schizophrenia, unspecified: Secondary | ICD-10-CM | POA: Diagnosis not present

## 2024-04-11 DIAGNOSIS — F02B18 Dementia in other diseases classified elsewhere, moderate, with other behavioral disturbance: Secondary | ICD-10-CM | POA: Diagnosis not present

## 2024-04-11 DIAGNOSIS — F633 Trichotillomania: Secondary | ICD-10-CM | POA: Diagnosis not present

## 2024-04-11 DIAGNOSIS — F251 Schizoaffective disorder, depressive type: Secondary | ICD-10-CM | POA: Diagnosis not present

## 2024-04-11 DIAGNOSIS — F5101 Primary insomnia: Secondary | ICD-10-CM | POA: Diagnosis not present

## 2024-04-11 DIAGNOSIS — F419 Anxiety disorder, unspecified: Secondary | ICD-10-CM | POA: Diagnosis not present

## 2024-04-13 ENCOUNTER — Other Ambulatory Visit: Payer: Self-pay | Admitting: Gastroenterology

## 2024-04-13 DIAGNOSIS — R1010 Upper abdominal pain, unspecified: Secondary | ICD-10-CM

## 2024-04-23 ENCOUNTER — Other Ambulatory Visit: Payer: Self-pay | Admitting: Gastroenterology

## 2024-04-23 NOTE — Telephone Encounter (Signed)
 Please arrange follow-up for this patient. I will go ahead and refill this Carafate  for now, but she likely doesn't need to be on it. This can be re-addressed at her OV.

## 2024-04-28 DIAGNOSIS — Z23 Encounter for immunization: Secondary | ICD-10-CM | POA: Diagnosis not present

## 2024-05-06 DIAGNOSIS — F209 Schizophrenia, unspecified: Secondary | ICD-10-CM | POA: Diagnosis not present

## 2024-05-06 DIAGNOSIS — I10 Essential (primary) hypertension: Secondary | ICD-10-CM | POA: Diagnosis not present

## 2024-05-13 DIAGNOSIS — F02B18 Dementia in other diseases classified elsewhere, moderate, with other behavioral disturbance: Secondary | ICD-10-CM | POA: Diagnosis not present

## 2024-05-13 DIAGNOSIS — F5101 Primary insomnia: Secondary | ICD-10-CM | POA: Diagnosis not present

## 2024-05-13 DIAGNOSIS — F419 Anxiety disorder, unspecified: Secondary | ICD-10-CM | POA: Diagnosis not present

## 2024-05-13 DIAGNOSIS — F633 Trichotillomania: Secondary | ICD-10-CM | POA: Diagnosis not present

## 2024-05-13 DIAGNOSIS — F251 Schizoaffective disorder, depressive type: Secondary | ICD-10-CM | POA: Diagnosis not present

## 2024-06-01 DIAGNOSIS — M79675 Pain in left toe(s): Secondary | ICD-10-CM | POA: Diagnosis not present

## 2024-06-01 DIAGNOSIS — M79674 Pain in right toe(s): Secondary | ICD-10-CM | POA: Diagnosis not present

## 2024-06-01 DIAGNOSIS — B351 Tinea unguium: Secondary | ICD-10-CM | POA: Diagnosis not present

## 2024-06-05 DIAGNOSIS — I10 Essential (primary) hypertension: Secondary | ICD-10-CM | POA: Diagnosis not present

## 2024-06-05 DIAGNOSIS — F209 Schizophrenia, unspecified: Secondary | ICD-10-CM | POA: Diagnosis not present

## 2024-06-06 DIAGNOSIS — F419 Anxiety disorder, unspecified: Secondary | ICD-10-CM | POA: Diagnosis not present

## 2024-06-06 DIAGNOSIS — F5101 Primary insomnia: Secondary | ICD-10-CM | POA: Diagnosis not present

## 2024-06-06 DIAGNOSIS — F02B18 Dementia in other diseases classified elsewhere, moderate, with other behavioral disturbance: Secondary | ICD-10-CM | POA: Diagnosis not present

## 2024-06-06 DIAGNOSIS — F251 Schizoaffective disorder, depressive type: Secondary | ICD-10-CM | POA: Diagnosis not present

## 2024-06-06 DIAGNOSIS — F633 Trichotillomania: Secondary | ICD-10-CM | POA: Diagnosis not present

## 2024-06-08 ENCOUNTER — Ambulatory Visit: Admitting: Gastroenterology

## 2024-06-10 DIAGNOSIS — Z79899 Other long term (current) drug therapy: Secondary | ICD-10-CM | POA: Diagnosis not present

## 2024-06-15 ENCOUNTER — Ambulatory Visit: Admitting: Gastroenterology

## 2024-06-21 ENCOUNTER — Other Ambulatory Visit: Payer: Self-pay | Admitting: Gastroenterology

## 2024-06-23 ENCOUNTER — Encounter: Payer: Self-pay | Admitting: Gastroenterology

## 2024-06-23 ENCOUNTER — Ambulatory Visit (INDEPENDENT_AMBULATORY_CARE_PROVIDER_SITE_OTHER): Admitting: Gastroenterology

## 2024-06-23 VITALS — BP 138/84 | HR 80 | Temp 97.9°F | Wt 183.2 lb

## 2024-06-23 DIAGNOSIS — R197 Diarrhea, unspecified: Secondary | ICD-10-CM

## 2024-06-23 DIAGNOSIS — R198 Other specified symptoms and signs involving the digestive system and abdomen: Secondary | ICD-10-CM | POA: Diagnosis not present

## 2024-06-23 DIAGNOSIS — R1011 Right upper quadrant pain: Secondary | ICD-10-CM | POA: Diagnosis not present

## 2024-06-23 MED ORDER — LACTAID 3000 UNITS PO TABS: 3000.0000 [IU] | ORAL_TABLET | Freq: Three times a day (TID) | ORAL | 3 refills | Status: AC

## 2024-06-23 NOTE — Progress Notes (Signed)
 GI Office Note    Referring Provider: Carlette Benita Area* Primary Care Physician:  Fanta, Tesfaye Demissie, MD Primary Gastroenterologist: Carlin POUR. Cindie, DO  Date:  06/23/2024  ID:  Stephanie Vincent, DOB Apr 16, 1956, MRN [id number]   Chief Complaint   Chief Complaint  Patient presents with   Follow-up    Follow up. Still having diarrhea   History of Present Illness  Stephanie Vincent is a 68 y.o. female with a history of chronic diarrhea, depression, ?dementia, schizophrenia, seizures, HTN, GERD, H. pylori gastritis, anemia, colon polyps presenting today with complaint of diarrhea and RUQ pain.   OV 07/03/2023 where she reported intermittent upper abdominal pain 3 times a week off and on for breakfast and eating things like bacon or sausage.  Also having some reflux with pain.  Symptoms lasting for 2+ months.  Similar symptoms improved with twice daily PPI in the past.  She was recommended to increase her pantoprazole  to twice a day and update labs including ultrasound.  Given her fluctuating bowel habits her diarrhea likely was driven by lactose intolerance and improved with limiting milk.  Discussed Lactaid tablets prior to dairy consumption and increasing Benefiber to 1 packet twice daily.  RUQ US  November 2024 without cholelithiasis or cholecystitis and some hepatic steatosis.  Last office visit 08/27/2023 with Josette Centers, PA-C.  Reported an increase of loose stools after pantoprazole  twice daily in May however she reported diarrhea after coming progressively worse but having 3 watery bowel movements daily.  Denying nocturnal stools.  Having blood with wiping for a couple weeks.  Denied any rectal pain.  Getting Lactaid tablets before drinking milk in the morning time.  Stephanie Vincent  stated she did not think the patient was getting Lactaid tablets and states that she would make sure that that was going to be documented.  She reportedly is drinking well at times daily but not  getting paid with every meal.  Labs TSH, celiac panel, CT abdomen pelvis with contrast ASAP when she was having tenderness to her abdomen.  Scheduled for colonoscopy and upper endoscopy.  Advise Lactaid tablets prior to all dairy consumption.  If persistent area with taking Lactaid would complete stool testing.  Continue PPI twice daily.  CT A/P with contrast January 2025 with hepatic steatosis, unremarkable gallbladder without biliary dilation.  Normal pancreas and spleen.  Radiopaque contrast not administered, no  dilation of the small or large bowel and no acute inflammation identified.  Nodular uterine contour commonly reflecting leiomyomas.  EGD May 2025: - Z- line regular, 36 cm from the incisors.  - Gastritis. Biopsied.  - Normal duodenal bulb, first portion of the duodenum and second portion of the duodenum. - Continue PPI twice daily - Pathology negative for H. pylori  Colonoscopy May 2025: - Non- bleeding internal hemorrhoids.  - One 4 mm polyp in the transverse colon, removed with a cold snare. Resected and retrieved. (Lymphoid aggregate) - The examined portion of the ileum was normal.  - The examination was otherwise normal.  - Random colonic biopsies taken to assess for microscopic colitis. (Negative) - Repeat colonoscopy in 10 years.  Today:  Per review of her MAR she is getting fiber packet twice daily, pantoprazole  40 mg twice daily Carafate  4 times daily, vitamin D.  There is no documentation of lactaid being given/administered anytime these last 4 days.  Caregiver with her today.   Discussed the use of AI scribe software for clinical note transcription with the patient, who gave verbal  consent to proceed.  She experiences diarrhea almost daily, with multiple watery episodes per day and significant urgency, making it difficult to reach the bathroom in time. There is no blood present in the stool. Despite consuming milk three times a day, she has not been consistently  taking Lactaid tablets, which may contribute to her symptoms. Her colonoscopy was normal.  She experiences abdominal pain and occasionally reports discomfort when eating. The pain is not consistently associated with eating specific foods, although she occasionally experiences discomfort when eating at times. Her caregiver has not observed any specific dietary triggers. A CT scan from January showed hepatic steatosis but was otherwise normal.  No associated nausea, vomiting, or burning in the chest or throat. No blood in her stools, black stools, decreased appetite, or weight loss. She reports some weight gain since her last visit. Her appetite is reportedly good, and she enjoys eating foods like pizza and peaches. She has been taking pantoprazole  40 mg twice daily for chronic reflux symptoms and was previously on Carafate  for upper abdominal discomfort.      Wt Readings from Last 6 Encounters:  06/23/24 183 lb 3.2 oz (83.1 kg)  03/03/24 183 lb (83 kg)  01/09/24 169 lb 15.6 oz (77.1 kg)  01/07/24 169 lb 15.6 oz (77.1 kg)  10/03/23 170 lb (77.1 kg)  08/27/23 203 lb (92.1 kg)    Body mass index is 30.49 kg/m.   Current Outpatient Medications  Medication Sig Dispense Refill   acetaminophen  (TYLENOL ) 325 MG tablet Take 2 tablets (650 mg total) by mouth 2 (two) times daily. 60 tablet 0   amLODipine (NORVASC) 5 MG tablet Take 5 mg by mouth in the morning. (0800)     ARIPiprazole (ABILIFY) 5 MG tablet Take 5 mg by mouth daily.     betamethasone dipropionate 0.05 % cream Apply 1 Application topically 2 (two) times daily as needed (rash).     Cholecalciferol (VITAMIN D3) 50 MCG (2000 UT) TABS Take 2,000 Units by mouth in the morning. (0800)     diclofenac Sodium (VOLTAREN) 1 % GEL Apply 2 g topically 4 (four) times daily.     divalproex  (DEPAKOTE ) 125 MG DR tablet Take 375 mg by mouth 3 (three) times daily. (0800, 1400 & 2000)     donepezil  (ARICEPT ) 10 MG tablet Take 10 mg by mouth daily at 8 pm.      lactase (LACTAID) 3000 units tablet TAKE (3) TABLETS (9,000 UNITS TOTAL) BY MOUTH DAILY AS NEEDED PRIOR TO INGESTING DAIRY. 90 tablet 5   loperamide (IMODIUM) 2 MG capsule Take 2 mg by mouth as needed for diarrhea or loose stools. Take 2 capsules (4 mg) by mouth with 1st loose stool. Then take 1 capsule (2 mg) by mouth after each loose stool as needed do not exceed 5 doses in 24hrs     LORazepam (ATIVAN) 0.5 MG tablet Take 0.5 mg by mouth every 6 (six) hours as needed for anxiety.     metFORMIN  (GLUCOPHAGE ) 850 MG tablet Take 850 mg by mouth 2 (two) times daily.     metoprolol  succinate (TOPROL -XL) 25 MG 24 hr tablet Take 1 tablet (25 mg total) by mouth daily. 30 tablet 1   montelukast (SINGULAIR) 10 MG tablet Take 10 mg by mouth daily at 8 pm. (2000)     OLANZapine  zydis (ZYPREXA ) 10 MG disintegrating tablet Take 5 mg by mouth 2 (two) times daily. (0800 & 2000)     pantoprazole  (PROTONIX ) 40 MG tablet TAKE (1)  TABLET BY MOUTH TWICE DAILY BEFORE A MEAL. 60 tablet 5   PHENobarbital  (LUMINAL) 32.4 MG tablet Take 32.4 mg by mouth at bedtime. (2000)     simethicone  (SIMETHICONE  DROPS INFANTS) 40 MG/0.6ML drops 20 cc's to prep container 30 mL 0   sodium phosphate (FLEET) ENEM Place 133 mLs (1 enema total) rectally as directed. 133 mL 0   sucralfate  (CARAFATE ) 1 g tablet DISSOLVE (1) TAB IN 15ML OF WATER  TO MAKE A SLURRY & DRINK 4X DAILY W/ MEALS & AT BEDTIME. 120 tablet 0   traZODone  (DESYREL ) 150 MG tablet Take 150 mg by mouth at bedtime. (2000)     Wheat Dextrin (BENEFIBER DRINK MIX) PACK Take 1 packet by mouth 2 (two) times daily. 180 each 3   No current facility-administered medications for this visit.    Past Medical History:  Diagnosis Date   Dementia (HCC)    Diabetes mellitus without complication (HCC)    H. pylori infection 06/01/2019   Treated with amoxicillin , Biaxin , Protonix    HTN (hypertension)    Manic depression (HCC)    Schizophrenia (HCC)    Seizures (HCC)     Past  Surgical History:  Procedure Laterality Date   BIOPSY  06/01/2019   Procedure: BIOPSY;  Surgeon: Harvey Margo CROME, MD;  [facility]: [facility];  Service: Endoscopy;;  gastric   BIOPSY  01/14/2023   Procedure: BIOPSY;  Surgeon: Cindie Carlin POUR, DO;  [facility]: [facility];  Service: Endoscopy;;   COLONOSCOPY  02/2009   Dr. Harvey: slightly torturous colon otherwise normal    COLONOSCOPY N/A 06/02/2019   Procedure: COLONOSCOPY;  Surgeon: Shaaron Lamar HERO, MD; redundant colon, 5 mm polyp in the descending colon, otherwise normal exam.  Path with tubular adenoma.  Recommended repeat in 5 years.   COLONOSCOPY N/A 01/09/2024   Procedure: COLONOSCOPY;  Surgeon: Cindie Carlin POUR, DO;  [facility];  Service: Endoscopy;  Laterality: N/A;  11:00am, asa 3 ([place] pt)   COLONOSCOPY WITH PROPOFOL  N/A 06/01/2019   Procedure: COLONOSCOPY WITH PROPOFOL ;  Surgeon: Harvey Margo CROME, MD; incomplete due to poor prep   ESOPHAGOGASTRODUODENOSCOPY N/A 01/09/2024   Procedure: EGD (ESOPHAGOGASTRODUODENOSCOPY);  Surgeon: Cindie Carlin POUR, DO;  [facility];  Service: Endoscopy;  Laterality: N/A;   ESOPHAGOGASTRODUODENOSCOPY (EGD) WITH PROPOFOL  N/A 06/01/2019   Procedure: ESOPHAGOGASTRODUODENOSCOPY (EGD) WITH PROPOFOL ;  Surgeon: Harvey Margo CROME, MD;  Normal esophagus, gastritis s/p biopsy, normal duodenum.  Pathology positive for H. Pylori.  Treated with amoxicillin , Biaxin , and Protonix .    ESOPHAGOGASTRODUODENOSCOPY (EGD) WITH PROPOFOL  N/A 01/14/2023   Procedure: ESOPHAGOGASTRODUODENOSCOPY (EGD) WITH PROPOFOL ;  Surgeon: Cindie Carlin POUR, DO;  [facility]: [facility];  Service: Endoscopy;  Laterality: N/A;  1:30pm, asa 3   POLYPECTOMY  06/02/2019   Procedure: POLYPECTOMY;  Surgeon: Shaaron Lamar HERO, MD;  [facility]: [facility];  Service: Endoscopy;;  colon    Family History  Problem Relation Age of Onset   Colon cancer Neg Hx    Colon polyps Neg Hx     Allergies as of 06/23/2024   (No  Known Allergies)    Social History   Socioeconomic History   Marital status: Single    Spouse name: Not on file   Number of children: Not on file   Years of education: Not on file   Highest education level: Not on file  Occupational History   Not on file  Tobacco Use   Smoking status: Never   Smokeless tobacco: Never  Vaping Use   Vaping status: Never Used  Substance and Sexual Activity   Alcohol  use: No   Drug use: No   Sexual activity: Not Currently  Other Topics Concern   Not on file  Social History Narrative   Not on file   Social Drivers of Health   Financial Resource Strain: Patient Declined (03/06/2022)   Received from [facility]   Overall Financial Resource Strain (CARDIA)    Difficulty of Paying Living Expenses: Patient declined  Food Insecurity: No Food Insecurity (05/31/2019)   Hunger Vital Sign    Worried About Running Out of Food in the Last Year: Never true    Ran Out of Food in the Last Year: Never true  Transportation Needs: No Transportation Needs (05/31/2019)   PRAPARE - Administrator, Civil Service (Medical): No    Lack of Transportation (Non-Medical): No  Physical Activity: Inactive (05/31/2019)   Exercise Vital Sign    Days of Exercise per Week: 0 days    Minutes of Exercise per Session: 0 min  Stress: Patient Declined (03/06/2022)   Received from [facility] - Occupational Stress Questionnaire    Feeling of Stress : Patient declined  Social Connections: Unknown (03/04/2022)   Received from [facility]   Social Network    Social Network: Not on file    Review of Systems   Gen: Denies fever, chills, anorexia. Denies fatigue, weakness, weight loss.  CV: Denies chest pain, palpitations, syncope, peripheral edema, and claudication. Resp: Denies dyspnea at rest, cough, wheezing, coughing up blood, and pleurisy. GI: See HPI Derm: Denies rash, itching, dry skin Psych: + memory loss  and confusion. Denies depression, anxiety.  Heme: Denies bruising, bleeding, and enlarged lymph nodes.  Physical Exam   BP 138/84 (BP Location: Right Arm, Patient Position: Sitting, Cuff Size: Large)   Pulse 80   Temp 97.9 F (36.6 C) (Temporal)   Wt 183 lb 3.2 oz (83.1 kg)   BMI 30.49 kg/m   General:   Alert and oriented. No distress noted. Pleasant and cooperative.  Head:  Normocephalic and atraumatic. Eyes:  Conjuctiva clear without scleral icterus. Mouth:  Oral mucosa pink and moist. Good dentition. No lesions. Lungs:  Clear to auscultation bilaterally. No wheezes, rales, or rhonchi. No distress.  Heart:  S1, S2 present without murmurs appreciated.  Abdomen:  +BS, soft, non-distended. Moderate RUQ ttp. Mild ttp to periumbilcal region. No rebound or guarding. No HSM or masses noted. Rectal: deferred Msk:  Symmetrical without gross deformities. Normal posture. Extremities:  Without edema. Neurologic:  Alert and  oriented x4 Psych:  Alert and cooperative. Normal mood and affect.  Assessment & Plan  Stephanie Vincent is a 68 y.o. female presenting today with ongoing diarrhea and worsening RUQ pain.      Chronic diarrhea Daily watery stools, urgency, and difficulty reaching the bathroom. No blood in stool. Possible lactose intolerance due to milk consumption. Previous colonoscopy normal. Differential includes dietary causes and pancreatic enzyme insufficiency. - Ordered stool studies to investigate diarrhea etiology - infectious studies, elastase, and fecal calprotectin - Scheduled Lactaid tablets three times a day with meals - Decrease fiber intake to potentially decrease diarrhea - Could consider trial of pancreatic enzymes as well to see if this improves diarrhea.   Right upper quadrant abdominal pain Tenderness in the right upper quadrant and peri-umbilical region. Previous CT scan in January showed hepatic steatosis but no hepatobiliary abnormalities or bowel inflammation.  Differential includes gallbladder pathology. - Ordered abdominal ultrasound to evaluate gallbladder - Will consider repeat CT scan if pain persists and ultrasound is inconclusive  Gastroesophageal reflux disease Chronic reflux symptoms managed with pantoprazole  40 mg twice daily. No current symptoms of breakthrough reflux. Had been taking Carafate  as well.  - Continue pantoprazole  40 mg twice daily.  (May need to consider change in PPI therapy given upper abdominal pain.) - Discontinue carafate  and monitor for worsening symptoms.      Follow up   Follow up 8 weeks.    [name], MSN, FNP-BC, AGACNP-BC [facility]

## 2024-06-23 NOTE — Patient Instructions (Addendum)
 We will schedule lactaid 3 times daily with meals, NOT as needed.  Will get right upper quadrant ultrasound to evaluate for any gallbladder etiology to diarrhea and right upper quadrant pain.  Given not having breakthrough reflux symptoms, nausea, vomiting and is eating well we will DC the Carafate .  For now given diarrhea I will decrease the fiber to 1 packet daily, we can give additional fiber packet in the evening if constipation is noted.  Continue pantoprazole  40 mg twice daily.  I have ordered stool studies to be completed to evaluate cause of diarrhea and assess for infection.  Follow-up in 8 weeks.  It was a pleasure to see you today. I want to create trusting relationships with patients. If you receive a survey regarding your visit,  I greatly appreciate you taking time to fill this out on paper or through your MyChart. I value your feedback.  [name], MSN, FNP-BC, AGACNP-BC [facility]

## 2024-06-24 ENCOUNTER — Telehealth: Payer: Self-pay | Admitting: Gastroenterology

## 2024-06-24 NOTE — Telephone Encounter (Signed)
 Please let [facility] [place] know that I was unaware that a prior provider had stopped her fiber supplementation given on her MAR that was provided to me at her visit it was still listed as her receiving.   Please let them know it is okay for her to not taking the fiber and keep this as discontinued.  [name], MSN, APRN, FNP-BC, AGACNP-BC [facility]

## 2024-06-28 DIAGNOSIS — R197 Diarrhea, unspecified: Secondary | ICD-10-CM | POA: Diagnosis not present

## 2024-06-29 ENCOUNTER — Ambulatory Visit: Payer: Self-pay | Admitting: Gastroenterology

## 2024-06-29 LAB — CLOSTRIDIUM DIFFICILE BY PCR: Toxigenic C. Difficile by PCR: NEGATIVE

## 2024-06-30 NOTE — Telephone Encounter (Signed)
 Noted. Spoke to Sylvia Training And Development Officer) informed her of recommendations. She voiced understanding. She said she will fax a form to be signed for fiber to be D\C.

## 2024-07-01 ENCOUNTER — Ambulatory Visit (HOSPITAL_COMMUNITY)
Admission: RE | Admit: 2024-07-01 | Discharge: 2024-07-01 | Disposition: A | Discharge status: Home / Self Care | Source: Ambulatory Visit | Attending: Gastroenterology | Admitting: Gastroenterology

## 2024-07-01 DIAGNOSIS — R1011 Right upper quadrant pain: Secondary | ICD-10-CM | POA: Insufficient documentation

## 2024-07-01 DIAGNOSIS — R197 Diarrhea, unspecified: Secondary | ICD-10-CM | POA: Insufficient documentation

## 2024-07-01 DIAGNOSIS — K7689 Other specified diseases of liver: Secondary | ICD-10-CM | POA: Diagnosis not present

## 2024-07-01 LAB — PANCREATIC ELASTASE, FECAL: Pancreatic Elastase, Fecal: 310 ug Elast./g (ref >200)

## 2024-07-02 ENCOUNTER — Other Ambulatory Visit: Payer: Self-pay | Admitting: *Deleted

## 2024-07-02 DIAGNOSIS — R1011 Right upper quadrant pain: Secondary | ICD-10-CM

## 2024-07-02 LAB — GI PROFILE, STOOL, PCR

## 2024-07-02 LAB — CALPROTECTIN, FECAL: Calprotectin, Fecal: 103 ug/g (ref 0–120)

## 2024-07-06 DIAGNOSIS — F209 Schizophrenia, unspecified: Secondary | ICD-10-CM | POA: Diagnosis not present

## 2024-07-06 DIAGNOSIS — I10 Essential (primary) hypertension: Secondary | ICD-10-CM | POA: Diagnosis not present

## 2024-07-07 ENCOUNTER — Encounter: Payer: Self-pay | Admitting: Gastroenterology

## 2024-07-08 DIAGNOSIS — F633 Trichotillomania: Secondary | ICD-10-CM | POA: Diagnosis not present

## 2024-07-08 DIAGNOSIS — F02B18 Dementia in other diseases classified elsewhere, moderate, with other behavioral disturbance: Secondary | ICD-10-CM | POA: Diagnosis not present

## 2024-07-08 DIAGNOSIS — F5101 Primary insomnia: Secondary | ICD-10-CM | POA: Diagnosis not present

## 2024-07-08 DIAGNOSIS — F251 Schizoaffective disorder, depressive type: Secondary | ICD-10-CM | POA: Diagnosis not present

## 2024-07-08 DIAGNOSIS — F419 Anxiety disorder, unspecified: Secondary | ICD-10-CM | POA: Diagnosis not present

## 2024-07-14 NOTE — Progress Notes (Signed)
 Labs sent to Dr. Alexa office

## 2024-07-16 ENCOUNTER — Other Ambulatory Visit (HOSPITAL_COMMUNITY)

## 2024-07-19 ENCOUNTER — Ambulatory Visit: Payer: Self-pay | Admitting: Gastroenterology

## 2024-07-19 ENCOUNTER — Ambulatory Visit (HOSPITAL_COMMUNITY)
Admission: RE | Admit: 2024-07-19 | Discharge: 2024-07-19 | Disposition: A | Discharge status: Home / Self Care | Source: Ambulatory Visit | Attending: Gastroenterology | Admitting: Gastroenterology

## 2024-07-19 DIAGNOSIS — R1011 Right upper quadrant pain: Secondary | ICD-10-CM | POA: Diagnosis not present

## 2024-07-19 DIAGNOSIS — R197 Diarrhea, unspecified: Secondary | ICD-10-CM

## 2024-07-19 DIAGNOSIS — R1010 Upper abdominal pain, unspecified: Secondary | ICD-10-CM

## 2024-07-19 DIAGNOSIS — K760 Fatty (change of) liver, not elsewhere classified: Secondary | ICD-10-CM | POA: Diagnosis not present

## 2024-07-19 MED ORDER — PANCRELIPASE (LIP-PROT-AMYL) 36000-114000 UNITS PO CPEP: ORAL_CAPSULE | ORAL | 5 refills | Status: DC

## 2024-07-19 MED ORDER — IOHEXOL 300 MG/ML  SOLN
100.0000 mL | Freq: Once | INTRAMUSCULAR | Status: AC | PRN
  Administered 2024-07-19: 100 mL via INTRAVENOUS

## 2024-07-21 DIAGNOSIS — I10 Essential (primary) hypertension: Secondary | ICD-10-CM | POA: Diagnosis not present

## 2024-07-21 DIAGNOSIS — G40909 Epilepsy, unspecified, not intractable, without status epilepticus: Secondary | ICD-10-CM | POA: Diagnosis not present

## 2024-07-21 DIAGNOSIS — Z23 Encounter for immunization: Secondary | ICD-10-CM | POA: Diagnosis not present

## 2024-07-21 DIAGNOSIS — Z1331 Encounter for screening for depression: Secondary | ICD-10-CM | POA: Diagnosis not present

## 2024-07-21 DIAGNOSIS — J410 Simple chronic bronchitis: Secondary | ICD-10-CM | POA: Diagnosis not present

## 2024-07-21 DIAGNOSIS — F209 Schizophrenia, unspecified: Secondary | ICD-10-CM | POA: Diagnosis not present

## 2024-07-21 DIAGNOSIS — M170 Bilateral primary osteoarthritis of knee: Secondary | ICD-10-CM | POA: Diagnosis not present

## 2024-07-21 DIAGNOSIS — M19071 Primary osteoarthritis, right ankle and foot: Secondary | ICD-10-CM | POA: Diagnosis not present

## 2024-07-21 DIAGNOSIS — Z1389 Encounter for screening for other disorder: Secondary | ICD-10-CM | POA: Diagnosis not present

## 2024-07-21 DIAGNOSIS — F0390 Unspecified dementia without behavioral disturbance: Secondary | ICD-10-CM | POA: Diagnosis not present

## 2024-07-21 DIAGNOSIS — E1169 Type 2 diabetes mellitus with other specified complication: Secondary | ICD-10-CM | POA: Diagnosis not present

## 2024-07-23 ENCOUNTER — Other Ambulatory Visit: Payer: Self-pay | Admitting: *Deleted

## 2024-07-23 DIAGNOSIS — R1011 Right upper quadrant pain: Secondary | ICD-10-CM

## 2024-07-23 DIAGNOSIS — R197 Diarrhea, unspecified: Secondary | ICD-10-CM

## 2024-08-03 ENCOUNTER — Telehealth: Payer: Self-pay | Admitting: Gastroenterology

## 2024-08-03 DIAGNOSIS — K8689 Other specified diseases of pancreas: Secondary | ICD-10-CM

## 2024-08-03 DIAGNOSIS — R197 Diarrhea, unspecified: Secondary | ICD-10-CM

## 2024-08-03 MED ORDER — ZENPEP 60000-189600 UNITS PO CPEP: 1.0000 | ORAL_CAPSULE | Freq: Three times a day (TID) | ORAL | 11 refills | Status: AC

## 2024-08-03 MED ORDER — ZENPEP 25000-79000 UNITS PO CPEP: 1.0000 | ORAL_CAPSULE | ORAL | 11 refills | Status: AC

## 2024-08-03 NOTE — Telephone Encounter (Signed)
 Received Creon   denial. Needs to try and fail either lower doses of creon  or zenpep .   Given this will submit for 60000 unit capsules of zenpep . 1 with meals and will use zenpep  25000 unit capsules with snacks as needed.   [name], MSN, APRN, FNP-BC, AGACNP-BC [facility]

## 2024-08-03 NOTE — Telephone Encounter (Signed)
 Noted. Informed pharmacy to switch to Zenpep 

## 2024-08-06 ENCOUNTER — Ambulatory Visit: Payer: Self-pay | Admitting: Gastroenterology

## 2024-08-06 LAB — GI PROFILE, STOOL, PCR

## 2024-09-07 ENCOUNTER — Emergency Department (HOSPITAL_COMMUNITY)
Admission: EM | Admit: 2024-09-07 | Discharge: 2024-09-07 | Disposition: A | Discharge status: Home / Self Care | Source: Skilled Nursing Facility | Attending: Emergency Medicine | Admitting: Emergency Medicine

## 2024-09-07 ENCOUNTER — Emergency Department (HOSPITAL_COMMUNITY)

## 2024-09-07 ENCOUNTER — Encounter (HOSPITAL_COMMUNITY): Payer: Self-pay

## 2024-09-07 DIAGNOSIS — W010XXA Fall on same level from slipping, tripping and stumbling without subsequent striking against object, initial encounter: Secondary | ICD-10-CM | POA: Diagnosis not present

## 2024-09-07 DIAGNOSIS — S8392XA Sprain of unspecified site of left knee, initial encounter: Secondary | ICD-10-CM | POA: Diagnosis not present

## 2024-09-07 DIAGNOSIS — M25562 Pain in left knee: Secondary | ICD-10-CM | POA: Diagnosis present

## 2024-09-07 MED ORDER — IBUPROFEN 400 MG PO TABS
600.0000 mg | ORAL_TABLET | Freq: Once | ORAL | Status: AC
  Administered 2024-09-07: 600 mg via ORAL
  Filled 2024-09-07: qty 2

## 2024-09-07 NOTE — ED Provider Notes (Signed)
 " [facility] AT [facility] Provider Note   CSN: [id number] Arrival date & time: 09/07/24  9287     Patient presents with: Fall and Knee Pain (Left)   Stephanie Vincent is a 69 y.o. female.   HPI 69 year old female presents with a fall.  Has left knee pain.  States she was in her closet and slipped and injured her left knee.  No head injury, headache, neck pain, chest pain, etc.  Otherwise no signs or symptoms of illness per her.  She was able to stand assisted with EMS.  Prior to Admission medications  Medication Sig Start Date End Date Taking? Authorizing Provider  acetaminophen  (TYLENOL ) 325 MG tablet Take 2 tablets (650 mg total) by mouth 2 (two) times daily. 03/08/24   Onesimo Oneil LABOR, MD  amLODipine (NORVASC) 5 MG tablet Take 5 mg by mouth in the morning. (0800)    [provider]  ARIPiprazole (ABILIFY) 5 MG tablet Take 5 mg by mouth daily.    [provider]  betamethasone dipropionate 0.05 % cream Apply 1 Application topically 2 (two) times daily as needed (rash).    [provider]  Cholecalciferol (VITAMIN D3) 50 MCG (2000 UT) TABS Take 2,000 Units by mouth in the morning. (0800) 06/14/22   [provider]  diclofenac Sodium (VOLTAREN) 1 % GEL Apply 2 g topically 4 (four) times daily. 03/26/23   [provider]  divalproex  (DEPAKOTE ) 125 MG DR tablet Take 375 mg by mouth 3 (three) times daily. (0800, 1400 & 2000)    [provider]  donepezil  (ARICEPT ) 10 MG tablet Take 10 mg by mouth daily at 8 pm. 12/15/15   [provider]  lactase (LACTAID) 3000 units tablet Take 1 tablet (3,000 Units total) by mouth 3 (three) times daily with meals. 06/23/24   Kennedy Charmaine LITTIE, NP  loperamide (IMODIUM) 2 MG capsule Take 2 mg by mouth as needed for diarrhea or loose stools. Take 2 capsules (4 mg) by mouth with 1st loose stool. Then take 1 capsule (2 mg) by mouth after each loose stool as needed do not  exceed 5 doses in 24hrs    [provider]  LORazepam (ATIVAN) 0.5 MG tablet Take 0.5 mg by mouth every 6 (six) hours as needed for anxiety. 04/29/23   [provider]  metFORMIN  (GLUCOPHAGE ) 850 MG tablet Take 850 mg by mouth 2 (two) times daily. 11/01/23   [provider]  metoprolol  succinate (TOPROL -XL) 25 MG 24 hr tablet Take 1 tablet (25 mg total) by mouth daily. 10/16/20   Mesner, Jason, MD  montelukast (SINGULAIR) 10 MG tablet Take 10 mg by mouth daily at 8 pm. (2000)    [provider]  OLANZapine  zydis (ZYPREXA ) 10 MG disintegrating tablet Take 5 mg by mouth 2 (two) times daily. (0800 & 2000) 11/19/22   [provider]  Pancrelipase , Lip-Prot-Amyl, (ZENPEP ) 39999-810399 units CPEP Take 1 capsule by mouth 3 (three) times daily with meals. 08/03/24   Kennedy Charmaine LITTIE, NP  pantoprazole  (PROTONIX ) 40 MG tablet TAKE (1) TABLET BY MOUTH TWICE DAILY BEFORE A MEAL. 04/14/24   Rudy Josette RAMAN, PA-C  PHENobarbital  (LUMINAL) 32.4 MG tablet Take 32.4 mg by mouth at bedtime. (2000)    [provider]  simethicone  (SIMETHICONE  DROPS INFANTS) 40 MG/0.6ML drops 20 cc's to prep container 12/03/23   Cindie Carlin POUR, DO  sodium phosphate (FLEET) ENEM Place 133 mLs (1 enema total) rectally as directed. 12/03/23  Cindie Dunnings K, DO  sucralfate  (CARAFATE ) 1 g tablet DISSOLVE (1) TAB IN OF WATER  TO MAKE A SLURRY & DRINK 4X DAILY W/ MEALS & AT BEDTIME. 06/24/24   Shirlean Therisa ORN, NP  traZODone  (DESYREL ) 150 MG tablet Take 150 mg by mouth at bedtime. (2000)    [provider]  Wheat Dextrin (BENEFIBER DRINK MIX) PACK Take 1 packet by mouth 2 (two) times daily. 07/03/23   Rudy Josette RAMAN, PA-C  lisinopril  (PRINIVIL ,ZESTRIL ) 10 MG tablet Take 10 mg by mouth daily.  12/15/15 10/16/20  [provider]    Allergies: Patient has no known allergies.    Review of Systems  Musculoskeletal:  Positive for arthralgias.    Updated Vital Signs BP  126/82   Pulse 79   Temp 98.3 F (36.8 C) (Oral)   Resp 17   SpO2 97%   Physical Exam Vitals and nursing note reviewed.  Constitutional:      Appearance: She is well-developed.  HENT:     Head: Normocephalic and atraumatic.  Cardiovascular:     Rate and Rhythm: Normal rate and regular rhythm.     Pulses:          Dorsalis pedis pulses are 2+ on the left side.  Pulmonary:     Effort: Pulmonary effort is normal.  Musculoskeletal:     Left hip: No tenderness. Normal range of motion.     Left upper leg: No swelling or tenderness.     Left knee: No swelling or deformity. Normal range of motion. Tenderness present.     Left lower leg: No tenderness.     Left ankle: No tenderness.     Left foot: No tenderness.     Comments: Normal movement and grossly normal sensation in left foot.  Skin:    General: Skin is warm and dry.  Neurological:     Mental Status: She is alert.     (all labs ordered are listed, but only abnormal results are displayed) Labs Reviewed - No data to display  EKG: None  Radiology: DG Knee Left Port Result Date: 09/07/2024 CLINICAL DATA:  Fall.  Left knee pain. EXAM: PORTABLE LEFT KNEE - 1-2 VIEW COMPARISON:  01/04/2023 FINDINGS: No evidence for an acute fracture. No subluxation or dislocation. No joint effusion. Degenerative spurring is evident in all 3 compartments with associated meniscal calcification. 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 IMPRESSION: 1. No acute bony findings. 2. Tricompartmental degenerative changes. Electronically Signed   By: Camellia Candle M.D.   On: 09/07/2024 07:53     Procedures   Medications Ordered in the ED  ibuprofen  (ADVIL ) tablet 600 mg (600 mg Oral Given 09/07/24 0740)                                    Medical Decision Making Amount and/or Complexity of Data Reviewed Radiology: ordered and independent interpretation performed.    Details: No fracture  Risk Prescription  drug management.   Patient presents after a fall in her closet in her room at her living facility.  Discussed with the supervisor there, patient was turning around in her closet and seem to fall.  No signs of a head injury.  Patient has stable/normal vital signs.  X-ray was obtained and shows no fracture or dislocation.  Appears to be neurovascularly intact.  No ligamentous laxity on exam.  Able to get up and walk and appears stable for discharge back to her facility.     Final diagnoses:  Sprain of left knee, unspecified ligament, initial encounter    ED Discharge Orders     None          Freddi Hamilton, MD 09/07/24 [id number]  "

## 2024-09-07 NOTE — ED Notes (Signed)
 Pt ambulated with no issues. Edp at bedside

## 2024-09-07 NOTE — Discharge Instructions (Signed)
 The knee x-ray today did not show any broken bones or dislocations.  You may use ibuprofen  and Tylenol  to help with pain.  Follow-up with your primary care provider if not improving.  Return to the ER for any new or worsening symptoms.

## 2024-09-07 NOTE — ED Triage Notes (Signed)
 BIB [facility] for a fall, unwitnessed denies hitting her head, Pt was trying to get in her closet. Complaint of Left knee pain.  HX Dementia. No blood thinners. 99% room air.

## 2024-09-08 ENCOUNTER — Ambulatory Visit: Admitting: Gastroenterology

## 2024-10-25 ENCOUNTER — Ambulatory Visit: Admitting: Gastroenterology
